# Patient Record
Sex: Male | Born: 1940 | Race: White | Hispanic: No | Marital: Married | Smoking: Former smoker
Health system: Southern US, Community
[De-identification: ages and names within clinical notes are randomized; demographics above are authoritative.]

## PROBLEM LIST (undated history)

## (undated) DIAGNOSIS — R413 Other amnesia: Secondary | ICD-10-CM

## (undated) DIAGNOSIS — E119 Type 2 diabetes mellitus without complications: Secondary | ICD-10-CM

## (undated) DIAGNOSIS — I639 Cerebral infarction, unspecified: Secondary | ICD-10-CM

## (undated) DIAGNOSIS — I10 Essential (primary) hypertension: Secondary | ICD-10-CM

## (undated) DIAGNOSIS — M1990 Unspecified osteoarthritis, unspecified site: Secondary | ICD-10-CM

## (undated) DIAGNOSIS — R7303 Prediabetes: Secondary | ICD-10-CM

## (undated) DIAGNOSIS — E785 Hyperlipidemia, unspecified: Secondary | ICD-10-CM

## (undated) HISTORY — DX: Hyperlipidemia, unspecified: E78.5

## (undated) HISTORY — DX: Other amnesia: R41.3

## (undated) HISTORY — DX: Cerebral infarction, unspecified: I63.9

## (undated) HISTORY — DX: Essential (primary) hypertension: I10

## (undated) HISTORY — PX: BACK SURGERY: SHX140

## (undated) HISTORY — PX: TONSILLECTOMY: SUR1361

## (undated) HISTORY — PX: EYE SURGERY: SHX253

## (undated) HISTORY — PX: NASAL SEPTUM SURGERY: SHX37

---

## 2002-11-09 ENCOUNTER — Encounter: Payer: Self-pay | Admitting: Dentistry

## 2002-11-09 ENCOUNTER — Ambulatory Visit (HOSPITAL_COMMUNITY): Admission: RE | Admit: 2002-11-09 | Discharge: 2002-11-09 | Payer: Self-pay | Admitting: Dentistry

## 2005-03-05 ENCOUNTER — Encounter: Admission: RE | Admit: 2005-03-05 | Discharge: 2005-03-05 | Payer: Self-pay | Admitting: Family Medicine

## 2006-09-29 ENCOUNTER — Encounter: Admission: RE | Admit: 2006-09-29 | Discharge: 2006-09-29 | Payer: Self-pay | Admitting: Family Medicine

## 2011-04-08 DIAGNOSIS — N529 Male erectile dysfunction, unspecified: Secondary | ICD-10-CM | POA: Diagnosis not present

## 2011-04-08 DIAGNOSIS — E782 Mixed hyperlipidemia: Secondary | ICD-10-CM | POA: Diagnosis not present

## 2011-04-08 DIAGNOSIS — M129 Arthropathy, unspecified: Secondary | ICD-10-CM | POA: Diagnosis not present

## 2011-04-08 DIAGNOSIS — E119 Type 2 diabetes mellitus without complications: Secondary | ICD-10-CM | POA: Diagnosis not present

## 2011-09-23 DIAGNOSIS — IMO0002 Reserved for concepts with insufficient information to code with codable children: Secondary | ICD-10-CM | POA: Diagnosis not present

## 2011-09-30 DIAGNOSIS — IMO0002 Reserved for concepts with insufficient information to code with codable children: Secondary | ICD-10-CM | POA: Diagnosis not present

## 2011-10-15 DIAGNOSIS — S43429A Sprain of unspecified rotator cuff capsule, initial encounter: Secondary | ICD-10-CM | POA: Diagnosis not present

## 2011-10-15 DIAGNOSIS — M19019 Primary osteoarthritis, unspecified shoulder: Secondary | ICD-10-CM | POA: Diagnosis not present

## 2011-10-17 DIAGNOSIS — N529 Male erectile dysfunction, unspecified: Secondary | ICD-10-CM | POA: Diagnosis not present

## 2011-10-17 DIAGNOSIS — E119 Type 2 diabetes mellitus without complications: Secondary | ICD-10-CM | POA: Diagnosis not present

## 2011-10-17 DIAGNOSIS — E782 Mixed hyperlipidemia: Secondary | ICD-10-CM | POA: Diagnosis not present

## 2011-10-17 DIAGNOSIS — N400 Enlarged prostate without lower urinary tract symptoms: Secondary | ICD-10-CM | POA: Diagnosis not present

## 2011-10-17 DIAGNOSIS — M12519 Traumatic arthropathy, unspecified shoulder: Secondary | ICD-10-CM | POA: Diagnosis not present

## 2011-11-16 DIAGNOSIS — Z23 Encounter for immunization: Secondary | ICD-10-CM | POA: Diagnosis not present

## 2011-12-01 DIAGNOSIS — M67919 Unspecified disorder of synovium and tendon, unspecified shoulder: Secondary | ICD-10-CM | POA: Diagnosis not present

## 2011-12-01 DIAGNOSIS — M24119 Other articular cartilage disorders, unspecified shoulder: Secondary | ICD-10-CM | POA: Diagnosis not present

## 2011-12-01 DIAGNOSIS — M19019 Primary osteoarthritis, unspecified shoulder: Secondary | ICD-10-CM | POA: Diagnosis not present

## 2011-12-01 DIAGNOSIS — M75110 Incomplete rotator cuff tear or rupture of unspecified shoulder, not specified as traumatic: Secondary | ICD-10-CM | POA: Diagnosis not present

## 2011-12-01 DIAGNOSIS — M719 Bursopathy, unspecified: Secondary | ICD-10-CM | POA: Diagnosis not present

## 2011-12-01 DIAGNOSIS — M7520 Bicipital tendinitis, unspecified shoulder: Secondary | ICD-10-CM | POA: Diagnosis not present

## 2011-12-01 DIAGNOSIS — M9420 Chondromalacia, unspecified site: Secondary | ICD-10-CM | POA: Diagnosis not present

## 2011-12-04 DIAGNOSIS — S43429A Sprain of unspecified rotator cuff capsule, initial encounter: Secondary | ICD-10-CM | POA: Diagnosis not present

## 2011-12-08 DIAGNOSIS — S43429A Sprain of unspecified rotator cuff capsule, initial encounter: Secondary | ICD-10-CM | POA: Diagnosis not present

## 2011-12-11 DIAGNOSIS — Z9889 Other specified postprocedural states: Secondary | ICD-10-CM | POA: Diagnosis not present

## 2011-12-11 DIAGNOSIS — S43429A Sprain of unspecified rotator cuff capsule, initial encounter: Secondary | ICD-10-CM | POA: Diagnosis not present

## 2011-12-16 DIAGNOSIS — S43429A Sprain of unspecified rotator cuff capsule, initial encounter: Secondary | ICD-10-CM | POA: Diagnosis not present

## 2011-12-19 DIAGNOSIS — S43429A Sprain of unspecified rotator cuff capsule, initial encounter: Secondary | ICD-10-CM | POA: Diagnosis not present

## 2011-12-23 DIAGNOSIS — S43429A Sprain of unspecified rotator cuff capsule, initial encounter: Secondary | ICD-10-CM | POA: Diagnosis not present

## 2011-12-26 DIAGNOSIS — S43429A Sprain of unspecified rotator cuff capsule, initial encounter: Secondary | ICD-10-CM | POA: Diagnosis not present

## 2011-12-29 DIAGNOSIS — S43429A Sprain of unspecified rotator cuff capsule, initial encounter: Secondary | ICD-10-CM | POA: Diagnosis not present

## 2011-12-31 DIAGNOSIS — S43429A Sprain of unspecified rotator cuff capsule, initial encounter: Secondary | ICD-10-CM | POA: Diagnosis not present

## 2012-01-05 DIAGNOSIS — S43429A Sprain of unspecified rotator cuff capsule, initial encounter: Secondary | ICD-10-CM | POA: Diagnosis not present

## 2012-01-12 DIAGNOSIS — S43429A Sprain of unspecified rotator cuff capsule, initial encounter: Secondary | ICD-10-CM | POA: Diagnosis not present

## 2012-01-19 DIAGNOSIS — S43429A Sprain of unspecified rotator cuff capsule, initial encounter: Secondary | ICD-10-CM | POA: Diagnosis not present

## 2012-01-26 DIAGNOSIS — S43429A Sprain of unspecified rotator cuff capsule, initial encounter: Secondary | ICD-10-CM | POA: Diagnosis not present

## 2012-02-02 DIAGNOSIS — S43429A Sprain of unspecified rotator cuff capsule, initial encounter: Secondary | ICD-10-CM | POA: Diagnosis not present

## 2012-02-11 DIAGNOSIS — S43429A Sprain of unspecified rotator cuff capsule, initial encounter: Secondary | ICD-10-CM | POA: Diagnosis not present

## 2012-02-18 DIAGNOSIS — S43429A Sprain of unspecified rotator cuff capsule, initial encounter: Secondary | ICD-10-CM | POA: Diagnosis not present

## 2012-02-25 DIAGNOSIS — S43429A Sprain of unspecified rotator cuff capsule, initial encounter: Secondary | ICD-10-CM | POA: Diagnosis not present

## 2012-03-01 DIAGNOSIS — S43429A Sprain of unspecified rotator cuff capsule, initial encounter: Secondary | ICD-10-CM | POA: Diagnosis not present

## 2012-03-16 DIAGNOSIS — S43429A Sprain of unspecified rotator cuff capsule, initial encounter: Secondary | ICD-10-CM | POA: Diagnosis not present

## 2012-03-18 DIAGNOSIS — S43429A Sprain of unspecified rotator cuff capsule, initial encounter: Secondary | ICD-10-CM | POA: Diagnosis not present

## 2012-04-15 DIAGNOSIS — S43429A Sprain of unspecified rotator cuff capsule, initial encounter: Secondary | ICD-10-CM | POA: Diagnosis not present

## 2012-04-19 DIAGNOSIS — E119 Type 2 diabetes mellitus without complications: Secondary | ICD-10-CM | POA: Diagnosis not present

## 2012-04-19 DIAGNOSIS — M25519 Pain in unspecified shoulder: Secondary | ICD-10-CM | POA: Diagnosis not present

## 2012-04-19 DIAGNOSIS — E782 Mixed hyperlipidemia: Secondary | ICD-10-CM | POA: Diagnosis not present

## 2012-04-19 DIAGNOSIS — N529 Male erectile dysfunction, unspecified: Secondary | ICD-10-CM | POA: Diagnosis not present

## 2012-09-14 DIAGNOSIS — Z23 Encounter for immunization: Secondary | ICD-10-CM | POA: Diagnosis not present

## 2012-09-14 DIAGNOSIS — E119 Type 2 diabetes mellitus without complications: Secondary | ICD-10-CM | POA: Diagnosis not present

## 2012-09-14 DIAGNOSIS — E782 Mixed hyperlipidemia: Secondary | ICD-10-CM | POA: Diagnosis not present

## 2012-09-14 DIAGNOSIS — M25569 Pain in unspecified knee: Secondary | ICD-10-CM | POA: Diagnosis not present

## 2012-09-14 DIAGNOSIS — Z1331 Encounter for screening for depression: Secondary | ICD-10-CM | POA: Diagnosis not present

## 2012-12-16 DIAGNOSIS — Z23 Encounter for immunization: Secondary | ICD-10-CM | POA: Diagnosis not present

## 2013-01-26 DIAGNOSIS — L259 Unspecified contact dermatitis, unspecified cause: Secondary | ICD-10-CM | POA: Diagnosis not present

## 2013-01-26 DIAGNOSIS — M6530 Trigger finger, unspecified finger: Secondary | ICD-10-CM | POA: Diagnosis not present

## 2013-02-28 DIAGNOSIS — M6530 Trigger finger, unspecified finger: Secondary | ICD-10-CM | POA: Diagnosis not present

## 2013-03-24 DIAGNOSIS — E119 Type 2 diabetes mellitus without complications: Secondary | ICD-10-CM | POA: Diagnosis not present

## 2013-03-24 DIAGNOSIS — E782 Mixed hyperlipidemia: Secondary | ICD-10-CM | POA: Diagnosis not present

## 2013-03-24 DIAGNOSIS — Z1331 Encounter for screening for depression: Secondary | ICD-10-CM | POA: Diagnosis not present

## 2013-03-24 DIAGNOSIS — M25559 Pain in unspecified hip: Secondary | ICD-10-CM | POA: Diagnosis not present

## 2013-03-29 DIAGNOSIS — B86 Scabies: Secondary | ICD-10-CM | POA: Diagnosis not present

## 2013-04-07 DIAGNOSIS — Z961 Presence of intraocular lens: Secondary | ICD-10-CM | POA: Diagnosis not present

## 2013-04-07 DIAGNOSIS — H35369 Drusen (degenerative) of macula, unspecified eye: Secondary | ICD-10-CM | POA: Diagnosis not present

## 2013-04-08 DIAGNOSIS — M169 Osteoarthritis of hip, unspecified: Secondary | ICD-10-CM | POA: Diagnosis not present

## 2013-04-08 DIAGNOSIS — M1610 Unilateral primary osteoarthritis, unspecified hip: Secondary | ICD-10-CM | POA: Diagnosis not present

## 2013-04-22 DIAGNOSIS — L259 Unspecified contact dermatitis, unspecified cause: Secondary | ICD-10-CM | POA: Diagnosis not present

## 2013-04-22 DIAGNOSIS — B86 Scabies: Secondary | ICD-10-CM | POA: Diagnosis not present

## 2013-05-11 DIAGNOSIS — M1610 Unilateral primary osteoarthritis, unspecified hip: Secondary | ICD-10-CM | POA: Diagnosis not present

## 2013-05-11 DIAGNOSIS — M169 Osteoarthritis of hip, unspecified: Secondary | ICD-10-CM | POA: Diagnosis not present

## 2013-05-24 DIAGNOSIS — L259 Unspecified contact dermatitis, unspecified cause: Secondary | ICD-10-CM | POA: Diagnosis not present

## 2013-06-23 DIAGNOSIS — R197 Diarrhea, unspecified: Secondary | ICD-10-CM | POA: Diagnosis not present

## 2013-06-23 DIAGNOSIS — R1110 Vomiting, unspecified: Secondary | ICD-10-CM | POA: Diagnosis not present

## 2013-10-04 ENCOUNTER — Other Ambulatory Visit: Payer: Self-pay | Admitting: Family Medicine

## 2013-10-04 DIAGNOSIS — Z23 Encounter for immunization: Secondary | ICD-10-CM | POA: Diagnosis not present

## 2013-10-04 DIAGNOSIS — E782 Mixed hyperlipidemia: Secondary | ICD-10-CM | POA: Diagnosis not present

## 2013-10-04 DIAGNOSIS — Z1211 Encounter for screening for malignant neoplasm of colon: Secondary | ICD-10-CM | POA: Diagnosis not present

## 2013-10-04 DIAGNOSIS — M25559 Pain in unspecified hip: Secondary | ICD-10-CM | POA: Diagnosis not present

## 2013-10-04 DIAGNOSIS — Z87891 Personal history of nicotine dependence: Secondary | ICD-10-CM

## 2013-10-04 DIAGNOSIS — E119 Type 2 diabetes mellitus without complications: Secondary | ICD-10-CM | POA: Diagnosis not present

## 2013-10-04 DIAGNOSIS — Z136 Encounter for screening for cardiovascular disorders: Secondary | ICD-10-CM | POA: Diagnosis not present

## 2013-10-04 DIAGNOSIS — Z0000 Encounter for general adult medical examination without abnormal findings: Secondary | ICD-10-CM | POA: Diagnosis not present

## 2013-10-04 DIAGNOSIS — Z125 Encounter for screening for malignant neoplasm of prostate: Secondary | ICD-10-CM | POA: Diagnosis not present

## 2013-10-04 DIAGNOSIS — L2089 Other atopic dermatitis: Secondary | ICD-10-CM | POA: Diagnosis not present

## 2013-10-10 ENCOUNTER — Ambulatory Visit
Admission: RE | Admit: 2013-10-10 | Discharge: 2013-10-10 | Disposition: A | Discharge status: Home / Self Care | Payer: Medicare Other | Source: Ambulatory Visit | Attending: Family Medicine | Admitting: Family Medicine

## 2013-10-10 DIAGNOSIS — Z136 Encounter for screening for cardiovascular disorders: Secondary | ICD-10-CM | POA: Diagnosis not present

## 2013-10-10 DIAGNOSIS — Z87891 Personal history of nicotine dependence: Secondary | ICD-10-CM

## 2013-11-16 DIAGNOSIS — Z23 Encounter for immunization: Secondary | ICD-10-CM | POA: Diagnosis not present

## 2014-01-31 DIAGNOSIS — L300 Nummular dermatitis: Secondary | ICD-10-CM | POA: Diagnosis not present

## 2015-04-02 DIAGNOSIS — Z961 Presence of intraocular lens: Secondary | ICD-10-CM | POA: Diagnosis not present

## 2015-04-02 DIAGNOSIS — E119 Type 2 diabetes mellitus without complications: Secondary | ICD-10-CM | POA: Diagnosis not present

## 2015-04-02 DIAGNOSIS — H524 Presbyopia: Secondary | ICD-10-CM | POA: Diagnosis not present

## 2015-06-29 DIAGNOSIS — L309 Dermatitis, unspecified: Secondary | ICD-10-CM | POA: Diagnosis not present

## 2015-06-29 DIAGNOSIS — E782 Mixed hyperlipidemia: Secondary | ICD-10-CM | POA: Diagnosis not present

## 2015-06-29 DIAGNOSIS — M859 Disorder of bone density and structure, unspecified: Secondary | ICD-10-CM | POA: Diagnosis not present

## 2015-06-29 DIAGNOSIS — Z7984 Long term (current) use of oral hypoglycemic drugs: Secondary | ICD-10-CM | POA: Diagnosis not present

## 2015-06-29 DIAGNOSIS — Z1389 Encounter for screening for other disorder: Secondary | ICD-10-CM | POA: Diagnosis not present

## 2015-06-29 DIAGNOSIS — M25531 Pain in right wrist: Secondary | ICD-10-CM | POA: Diagnosis not present

## 2015-06-29 DIAGNOSIS — E119 Type 2 diabetes mellitus without complications: Secondary | ICD-10-CM | POA: Diagnosis not present

## 2015-06-29 DIAGNOSIS — M8580 Other specified disorders of bone density and structure, unspecified site: Secondary | ICD-10-CM | POA: Diagnosis not present

## 2015-07-24 DIAGNOSIS — M778 Other enthesopathies, not elsewhere classified: Secondary | ICD-10-CM | POA: Diagnosis not present

## 2015-07-24 DIAGNOSIS — R2231 Localized swelling, mass and lump, right upper limb: Secondary | ICD-10-CM | POA: Diagnosis not present

## 2015-09-04 DIAGNOSIS — M65331 Trigger finger, right middle finger: Secondary | ICD-10-CM | POA: Diagnosis not present

## 2015-09-04 DIAGNOSIS — M67441 Ganglion, right hand: Secondary | ICD-10-CM | POA: Diagnosis not present

## 2015-09-04 DIAGNOSIS — R2231 Localized swelling, mass and lump, right upper limb: Secondary | ICD-10-CM | POA: Diagnosis not present

## 2016-01-01 DIAGNOSIS — J309 Allergic rhinitis, unspecified: Secondary | ICD-10-CM | POA: Diagnosis not present

## 2016-01-01 DIAGNOSIS — Z1389 Encounter for screening for other disorder: Secondary | ICD-10-CM | POA: Diagnosis not present

## 2016-01-01 DIAGNOSIS — Z0000 Encounter for general adult medical examination without abnormal findings: Secondary | ICD-10-CM | POA: Diagnosis not present

## 2016-01-01 DIAGNOSIS — E782 Mixed hyperlipidemia: Secondary | ICD-10-CM | POA: Diagnosis not present

## 2016-01-01 DIAGNOSIS — Z23 Encounter for immunization: Secondary | ICD-10-CM | POA: Diagnosis not present

## 2016-01-01 DIAGNOSIS — E119 Type 2 diabetes mellitus without complications: Secondary | ICD-10-CM | POA: Diagnosis not present

## 2016-01-01 DIAGNOSIS — E559 Vitamin D deficiency, unspecified: Secondary | ICD-10-CM | POA: Diagnosis not present

## 2016-01-01 DIAGNOSIS — L309 Dermatitis, unspecified: Secondary | ICD-10-CM | POA: Diagnosis not present

## 2016-01-01 DIAGNOSIS — M8580 Other specified disorders of bone density and structure, unspecified site: Secondary | ICD-10-CM | POA: Diagnosis not present

## 2016-01-01 DIAGNOSIS — Z125 Encounter for screening for malignant neoplasm of prostate: Secondary | ICD-10-CM | POA: Diagnosis not present

## 2016-01-01 DIAGNOSIS — Z1211 Encounter for screening for malignant neoplasm of colon: Secondary | ICD-10-CM | POA: Diagnosis not present

## 2016-01-10 DIAGNOSIS — Z1211 Encounter for screening for malignant neoplasm of colon: Secondary | ICD-10-CM | POA: Diagnosis not present

## 2016-04-09 DIAGNOSIS — E119 Type 2 diabetes mellitus without complications: Secondary | ICD-10-CM | POA: Diagnosis not present

## 2016-04-09 DIAGNOSIS — H5211 Myopia, right eye: Secondary | ICD-10-CM | POA: Diagnosis not present

## 2016-04-09 DIAGNOSIS — H524 Presbyopia: Secondary | ICD-10-CM | POA: Diagnosis not present

## 2016-06-09 ENCOUNTER — Encounter: Payer: Self-pay | Admitting: Podiatry

## 2016-06-09 ENCOUNTER — Ambulatory Visit (INDEPENDENT_AMBULATORY_CARE_PROVIDER_SITE_OTHER): Payer: PPO | Admitting: Podiatry

## 2016-06-09 ENCOUNTER — Ambulatory Visit (INDEPENDENT_AMBULATORY_CARE_PROVIDER_SITE_OTHER): Payer: PPO

## 2016-06-09 DIAGNOSIS — M779 Enthesopathy, unspecified: Secondary | ICD-10-CM | POA: Diagnosis not present

## 2016-06-09 DIAGNOSIS — M722 Plantar fascial fibromatosis: Secondary | ICD-10-CM | POA: Insufficient documentation

## 2016-06-09 MED ORDER — MELOXICAM 15 MG PO TABS: 15.0000 mg | ORAL_TABLET | Freq: Every day | ORAL | 2 refills | Status: DC

## 2016-06-09 NOTE — Progress Notes (Signed)
Subjective:     Patient ID: Timothy Brewer, male   DOB: [birth year], 76 y.o.   MRN: [id number]  HPI 76 year old male presents to the office today for concerns left foot pain on the heel which is been ongoing for about 6 weeks. He states that he had a history of plantar fasciitis but this is one the arch of the foot. He denies any numbness or tingling. Denies any recent injury or trauma. The pain does not wake him up at night. He did have orthotics made when he had the arch issue is been wearing but this has not been helping the foot. He had no other treatment. He has no other complaints at this time.  Review of Systems  All other systems reviewed and are negative.      Objective:   Physical Exam General: AAO x3, NAD  Dermatological: Skin is warm, dry and supple bilateral. Nails x 10 are well manicured; remaining integument appears unremarkable at this time. There are no open sores, no preulcerative lesions, no rash or signs of infection present.  Vascular: Dorsalis Pedis artery and Posterior Tibial artery pedal pulses are 2/4 bilateral with immedate capillary fill time. There is no pain with calf compression, swelling, warmth, erythema.   Neruologic: Grossly intact via light touch bilateral. Vibratory intact via tuning fork bilateral. Protective threshold with Semmes Wienstein monofilament intact to all pedal sites bilateral.   Musculoskeletal: Tenderness to palpation along the plantar medial tubercle of the calcaneus at the insertion of plantar fascia on the left foot. There is no pain along the course of the plantar fascia within the arch of the foot. Plantar fascia appears to be intact. There is no pain with lateral compression of the calcaneus or pain with vibratory sensation. There is no pain along the course or insertion of the achilles tendon. No other areas of tenderness to bilateral lower extremities.  Muscular strength 5/5 in all groups tested bilateral.  Gait: Unassisted,  Nonantalgic.      Assessment:     Left heel pain, likely plantar fasciitis    Plan:     -Treatment options discussed including all alternatives, risks, and complications -Etiology of symptoms were discussed -X-rays were obtained and reviewed with the patient.  -Patient elects to proceed with steroid injection into the left heel. Under sterile skin preparation, a total of 2.5cc of kenalog 10, 0.5% Marcaine plain, and 2% lidocaine plain were infiltrated into the symptomatic area without complication. A band-aid was applied. Patient tolerated the injection well without complication. Post-injection care with discussed with the patient. Discussed with the patient to ice the area over the next couple of days to help prevent a steroid flare.  Plantar fascial brace was dispensed -Prescribed mobic. Discussed side effects of the medication and directed to stop if any are to occur and call the office.  -Night splint dispensed  -Stretching, icing exercises daily. Dispensed aspects -Shoegear modifications. Continue orthotics. He was only wearing it on the left and recommended to wear the one on the right as well -RTC in 3 weeks or sooner if needed  Celesta Gentile, DPM

## 2016-06-30 ENCOUNTER — Ambulatory Visit (INDEPENDENT_AMBULATORY_CARE_PROVIDER_SITE_OTHER): Payer: PPO | Admitting: Podiatry

## 2016-06-30 ENCOUNTER — Encounter: Payer: Self-pay | Admitting: Podiatry

## 2016-06-30 DIAGNOSIS — M722 Plantar fascial fibromatosis: Secondary | ICD-10-CM

## 2016-06-30 NOTE — Progress Notes (Signed)
Subjective: Timothy Brewer presents to the office today for follow-up evaluation of left heel pain. They state that they are doing about "half better". He did purchase new shoes which has been helping. He was wearing the plantar fascial brace the first couple weeks but stopped wearing it. He has not been stretching or icing. No other complaints at this time. No acute changes since last appointment. They deny any systemic complaints such as fevers, chills, nausea, vomiting.  Objective: General: AAO x3, NAD  Dermatological: Skin is warm, dry and supple bilateral. Nails x 10 are well manicured; remaining integument appears unremarkable at this time. There are no open sores, no preulcerative lesions, no rash or signs of infection present.  Vascular: Dorsalis Pedis artery and Posterior Tibial artery pedal pulses are 2/4 bilateral with immedate capillary fill time. Pedal hair growth present. There is no pain with calf compression, swelling, warmth, erythema.   Neruologic: Grossly intact via light touch bilateral.   Musculoskeletal: There is improved yet continued tenderness palpation along the plantar medial tubercle of the calcaneus at the insertion of the plantar fascia on the  left foot. There is no pain along the course of the plantar fascia within the arch of the foot. Plantar fascia appears to be intact bilaterally. There is no pain with lateral compression of the calcaneus and there is no pain with vibratory sensation. There is no pain along the course or insertion of the Achilles tendon. There are no other areas of tenderness to bilateral lower extremities. No gross boney pedal deformities bilateral. No pain, crepitus, or limitation noted with foot and ankle range of motion bilateral. Muscular strength 5/5 in all groups tested bilateral.  Gait: Unassisted, Nonantalgic.   Assessment: Presents for follow-up evaluation for heel pain, likely plantar fasciitis with about 50%  improvement  Plan: -Treatment options discussed including all alternatives, risks, and complications -Patient elects to proceed with steroid injection into the left heel. Under sterile skin preparation, a total of 2.5cc of kenalog 10, 0.5% Marcaine plain, and 2% lidocaine plain were infiltrated into the symptomatic area without complication. A band-aid was applied. Patient tolerated the injection well without complication. Post-injection care with discussed with the patient. Discussed with the patient to ice the area over the next couple of days to help prevent a steroid flare.  -Plantar fascial taping applied. -Continue plantar fascial brace. As well as night splint. -Ice and stretching exercises on a daily basis. -Continue supportive shoe gear. -Follow-up in 4 weeks or sooner if any problems arise. In the meantime, encouraged to call the office with any questions, concerns, change in symptoms.   Celesta Gentile, DPM

## 2016-07-01 DIAGNOSIS — E782 Mixed hyperlipidemia: Secondary | ICD-10-CM | POA: Diagnosis not present

## 2016-07-01 DIAGNOSIS — M722 Plantar fascial fibromatosis: Secondary | ICD-10-CM | POA: Diagnosis not present

## 2016-07-01 DIAGNOSIS — E119 Type 2 diabetes mellitus without complications: Secondary | ICD-10-CM | POA: Diagnosis not present

## 2016-07-01 DIAGNOSIS — E559 Vitamin D deficiency, unspecified: Secondary | ICD-10-CM | POA: Diagnosis not present

## 2016-07-01 DIAGNOSIS — J309 Allergic rhinitis, unspecified: Secondary | ICD-10-CM | POA: Diagnosis not present

## 2016-07-01 DIAGNOSIS — M8580 Other specified disorders of bone density and structure, unspecified site: Secondary | ICD-10-CM | POA: Diagnosis not present

## 2016-07-01 DIAGNOSIS — L309 Dermatitis, unspecified: Secondary | ICD-10-CM | POA: Diagnosis not present

## 2016-07-28 ENCOUNTER — Ambulatory Visit (INDEPENDENT_AMBULATORY_CARE_PROVIDER_SITE_OTHER): Payer: PPO | Admitting: Podiatry

## 2016-07-28 ENCOUNTER — Encounter: Payer: Self-pay | Admitting: Podiatry

## 2016-07-28 DIAGNOSIS — M722 Plantar fascial fibromatosis: Secondary | ICD-10-CM | POA: Diagnosis not present

## 2016-07-28 MED ORDER — TRIAMCINOLONE ACETONIDE 10 MG/ML IJ SUSP
10.0000 mg | Freq: Once | INTRAMUSCULAR | Status: AC
  Administered 2016-07-28: 10 mg

## 2016-07-28 NOTE — Patient Instructions (Signed)

## 2016-07-31 NOTE — Progress Notes (Signed)
Subjective: Timothy Brewer presents to the office today for follow-up evaluation of left heel pain. He states that he is doing better but still having some pain, although not like he was. He states he has not been stretching. His knee shoes are feeling better. No other complaints at this time. No acute changes since last appointment. They deny any systemic complaints such as fevers, chills, nausea, vomiting.  Objective: General: AAO x3, NAD  Dermatological:  There are no open sores, no preulcerative lesions, no rash or signs of infection present.  Vascular: Dorsalis Pedis artery and Posterior Tibial artery pedal pulses are 2/4 bilateral with immedate capillary fill time. Pedal hair growth present. There is no pain with calf compression, swelling, warmth, erythema.   Neruologic: Grossly intact via light touch bilateral. Negative tinel sign.   Musculoskeletal: There is mild continued tenderness tp palpation along the plantar medial tubercle of the calcaneus at the insertion of the plantar fascia on the  left foot. There is no pain along the course of the plantar fascia within the arch of the foot. Plantar fascia appears to be intact bilaterally. There is no pain with lateral compression of the calcaneus and there is no pain with vibratory sensation. There is no pain along the course or insertion of the Achilles tendon. There are no other areas of tenderness to bilateral lower extremities. No gross boney pedal deformities bilateral. No pain, crepitus, or limitation noted with foot and ankle range of motion bilateral. Muscular strength 5/5 in all groups tested bilateral.  Gait: Unassisted, Nonantalgic.   Assessment: Presents for follow-up evaluation for heel pain, likely plantar fasciitis with about 50% improvement  Plan: -Treatment options discussed including all alternatives, risks, and complications -Patient elects to proceed with steroid injection into the left heel. Under sterile skin  preparation, a total of 2.5cc of kenalog 10, 0.5% Marcaine plain, and 2% lidocaine plain were infiltrated into the symptomatic area without complication. A band-aid was applied. Patient tolerated the injection well without complication. Post-injection care with discussed with the patient. Discussed with the patient to ice the area over the next couple of days to help prevent a steroid flare.  -Continue plantar fascial brace; night splint. -Ice and stretching exercises on a daily basis. -Continue supportive shoe gear. -Follow-up in 4 weeks if symptoms continue or sooner if any problems arise. In the meantime, encouraged to call the office with any questions, concerns, change in symptoms.   Celesta Gentile, DPM

## 2016-08-29 ENCOUNTER — Ambulatory Visit: Payer: PPO | Admitting: Podiatry

## 2016-12-04 DIAGNOSIS — M1612 Unilateral primary osteoarthritis, left hip: Secondary | ICD-10-CM | POA: Diagnosis not present

## 2016-12-04 DIAGNOSIS — M25552 Pain in left hip: Secondary | ICD-10-CM | POA: Diagnosis not present

## 2016-12-17 DIAGNOSIS — M1612 Unilateral primary osteoarthritis, left hip: Secondary | ICD-10-CM | POA: Diagnosis not present

## 2017-01-14 DIAGNOSIS — M1612 Unilateral primary osteoarthritis, left hip: Secondary | ICD-10-CM | POA: Diagnosis not present

## 2017-01-14 DIAGNOSIS — M25552 Pain in left hip: Secondary | ICD-10-CM | POA: Diagnosis not present

## 2017-02-04 NOTE — H&P (Signed)
TOTAL HIP ADMISSION H&P  Patient is admitted for left total hip arthroplasty, anterior approach.  Subjective:  Chief Complaint:     Left hip primary OA / pain  HPI: Timothy Brewer, 76 y.o. male, has a history of pain and functional disability in the left hip(s) due to arthritis and patient has failed non-surgical conservative treatments for greater than 12 weeks to include NSAID's and/or analgesics, corticosteriod injections and activity modification.  Onset of symptoms was gradual starting 3+ years ago with gradually worsening course since that time.The patient noted no past surgery on the left hip(s).  Patient currently rates pain in the left hip at 8 out of 10 with activity. Patient has night pain, worsening of pain with activity and weight bearing, trendelenberg gait, pain that interfers with activities of daily living and pain with passive range of motion. Patient has evidence of periarticular osteophytes and joint space narrowing by imaging studies. This condition presents safety issues increasing the risk of falls.  There is no current active infection.  Risks, benefits and expectations were discussed with the patient.  Risks including but not limited to the risk of anesthesia, blood clots, nerve damage, blood vessel damage, failure of the prosthesis, infection and up to and including death.  Patient understand the risks, benefits and expectations and wishes to proceed with surgery.    PCP: Timothy Contras, Timothy Brewer  D/C Plans:       Home   Post-op Meds:       No Rx given  Tranexamic Acid:      To be given - IV   Decadron:      Is to be given  FYI:     ASA  Norco  DME:      Patient didn't receive Rx for RW  PT:       No PT    Patient Active Problem List   Diagnosis Date Noted  . Plantar fasciitis 06/09/2016   Past Medical History:  Diagnosis Date  . Arthritis   . Pre-diabetes    on metformin    Past Surgical History:  Procedure Laterality Date  . BACK SURGERY     c3,c4,c5,c6  ,l4,l5  . EYE SURGERY     bil cataracts  . NASAL SEPTUM SURGERY    . TONSILLECTOMY      No current facility-administered medications for this encounter.    Current Outpatient Medications  Medication Sig Dispense Refill Last Dose  . aspirin EC 81 MG tablet Take 81 mg by mouth daily.     Marland Kitchen atorvastatin (LIPITOR) 20 MG tablet Take 20 mg by mouth 2 (two) times a week.      . Calcium Polycarbophil (FIBER-CAPS PO) Take 1 capsule by mouth 2 (two) times daily.      . cetirizine (ZYRTEC) 10 MG tablet Take 10 mg by mouth at bedtime.     . clobetasol cream (TEMOVATE) 6.59 % Apply 1 application topically daily.     . fexofenadine (ALLEGRA) 180 MG tablet Take 180 mg by mouth daily.     . meloxicam (MOBIC) 15 MG tablet Take 1 tablet (15 mg total) by mouth daily. 30 tablet 2   . metFORMIN (GLUCOPHAGE-XR) 500 MG 24 hr tablet Take 500 mg by mouth 2 (two) times daily.      . Multiple Vitamin (MULTIVITAMIN) tablet Take 1 tablet by mouth daily.     . Multiple Vitamins-Minerals (PRESERVISION AREDS 2 PO) Take 1 capsule by mouth 2 (two) times daily.     Marland Kitchen  OVER THE COUNTER MEDICATION Take 2 tablets by mouth daily. Active PK Supplement      Allergies  Allergen Reactions  . Iodine Hives and Other (See Comments)    Internal only    Social History   Tobacco Use  . Smoking status: Current Every Day Smoker    Types: Cigarettes  . Smokeless tobacco: Never Used  . Tobacco comment: 8-10 a day  Substance Use Topics  . Alcohol use: Yes    Alcohol/week: 0.6 oz    Types: 1 Glasses of wine per week    Comment: socially       Review of Systems  Constitutional: Negative.   HENT: Positive for tinnitus.   Eyes: Negative.   Respiratory: Negative.   Cardiovascular: Negative.   Gastrointestinal: Negative.   Genitourinary: Negative.   Musculoskeletal: Positive for joint pain.  Skin: Negative.   Neurological: Negative.   Endo/Heme/Allergies: Negative.   Psychiatric/Behavioral: Negative.      Objective:  Physical Exam  Constitutional: He is oriented to person, place, and time. He appears well-developed.  HENT:  Head: Normocephalic.  Mouth/Throat: He has dentures.  Eyes: Pupils are equal, round, and reactive to light.  Neck: Neck supple. No JVD present. No tracheal deviation present. No thyromegaly present.  Cardiovascular: Normal rate, regular rhythm and intact distal pulses.  Respiratory: Effort normal and breath sounds normal. No respiratory distress. He has no wheezes.  GI: Soft. There is no tenderness. There is no guarding.  Musculoskeletal:       Left hip: He exhibits decreased range of motion, decreased strength, tenderness and bony tenderness. He exhibits no swelling, no deformity and no laceration.  Lymphadenopathy:    He has no cervical adenopathy.  Neurological: He is alert and oriented to person, place, and time.  Skin: Skin is warm and dry.  Psychiatric: He has a normal mood and affect.      Imaging Review Plain radiographs demonstrate severe degenerative joint disease of the left hip(s). The bone quality appears to be good for age and reported activity level.  Assessment/Plan:  End stage arthritis, left hip(s)  The patient history, physical examination, clinical judgement of the provider and imaging studies are consistent with end stage degenerative joint disease of the left hip(s) and total hip arthroplasty is deemed medically necessary. The treatment options including medical management, injection therapy, arthroscopy and arthroplasty were discussed at length. The risks and benefits of total hip arthroplasty were presented and reviewed. The risks due to aseptic loosening, infection, stiffness, dislocation/subluxation,  thromboembolic complications and other imponderables were discussed.  The patient acknowledged the explanation, agreed to proceed with the plan and consent was signed. Patient is being admitted for inpatient treatment for surgery, pain  control, PT, OT, prophylactic antibiotics, VTE prophylaxis, progressive ambulation and ADL's and discharge planning.The patient is planning to be discharged home.     West Pugh Tobin Witucki   PA-C  02/15/2017, 5:52 PM

## 2017-02-06 DIAGNOSIS — M1612 Unilateral primary osteoarthritis, left hip: Secondary | ICD-10-CM | POA: Diagnosis not present

## 2017-02-10 ENCOUNTER — Encounter (HOSPITAL_COMMUNITY): Payer: Self-pay

## 2017-02-10 DIAGNOSIS — Z7984 Long term (current) use of oral hypoglycemic drugs: Secondary | ICD-10-CM | POA: Diagnosis not present

## 2017-02-10 DIAGNOSIS — E782 Mixed hyperlipidemia: Secondary | ICD-10-CM | POA: Diagnosis not present

## 2017-02-10 DIAGNOSIS — M25552 Pain in left hip: Secondary | ICD-10-CM | POA: Diagnosis not present

## 2017-02-10 DIAGNOSIS — E119 Type 2 diabetes mellitus without complications: Secondary | ICD-10-CM | POA: Diagnosis not present

## 2017-02-10 DIAGNOSIS — Z1389 Encounter for screening for other disorder: Secondary | ICD-10-CM | POA: Diagnosis not present

## 2017-02-10 NOTE — Patient Instructions (Signed)
Timothy Brewer  02/10/2017   Your procedure is scheduled on:02-17-17   Report to [facility] Main  Entrance   Report to admitting at     0735 AM   Call this number if you have problems the morning of surgery   [phone]   Remember: ONLY 1 PERSON MAY GO WITH YOU TO SHORT STAY TO GET  READY MORNING OF [place].  Do not eat food or drink liquids :After Midnight.     Take these medicines the morning of surgery with A SIP OF WATER:  [place]  DO NOT TAKE ANY DIABETIC MEDICATIONS DAY OF YOUR SURGERY                               You may not have any metal on your body including hair pins and              piercings  Do not wear jewelry,  lotions, powders or perfumes, deodorant                        Men may shave face and neck.   Do not bring valuables to the hospital. [place].  Contacts, dentures or bridgework may not be worn into surgery.  Leave suitcase in the car. After surgery it may be brought to your room.                  Please read over the following fact sheets you were given: _____________________________________________________________________          [facility] - Preparing for Surgery Before surgery, you can play an important role.  Because skin is not sterile, your skin needs to be as free of germs as possible.  You can reduce the number of germs on your skin by washing with CHG (chlorahexidine gluconate) soap before surgery.  CHG is an antiseptic cleaner which kills germs and bonds with the skin to continue killing germs even after washing. Please DO NOT use if you have an allergy to CHG or antibacterial soaps.  If your skin becomes reddened/irritated stop using the CHG and inform your nurse when you arrive at Short Stay. Do not shave (including legs and underarms) for at least 48 hours prior to the first CHG shower.  You may shave your face/neck. Please follow these instructions  carefully:  1.  Shower with CHG Soap the night before surgery and the  morning of Surgery.  2.  If you choose to wash your hair, wash your hair first as usual with your  normal  shampoo.  3.  After you shampoo, rinse your hair and body thoroughly to remove the  shampoo.                           4.  Use CHG as you would any other liquid soap.  You can apply chg directly  to the skin and wash                       Gently with a scrungie or clean washcloth.  5.  Apply the CHG Soap to your body ONLY FROM THE NECK DOWN.  Do not use on face/ open                           Wound or open sores. Avoid contact with eyes, ears mouth and genitals (private parts).                       Wash face,  Genitals (private parts) with your normal soap.             6.  Wash thoroughly, paying special attention to the area where your surgery  will be performed.  7.  Thoroughly rinse your body with warm water from the neck down.  8.  DO NOT shower/wash with your normal soap after using and rinsing off  the CHG Soap.                9.  Pat yourself dry with a clean towel.            10.  Wear clean pajamas.            11.  Place clean sheets on your bed the night of your first shower and do not  sleep with pets. Day of Surgery : Do not apply any lotions/deodorants the morning of surgery.  Please wear clean clothes to the hospital/surgery center.  FAILURE TO FOLLOW THESE INSTRUCTIONS MAY RESULT IN THE CANCELLATION OF YOUR SURGERY PATIENT SIGNATURE_________________________________  NURSE SIGNATURE__________________________________  ________________________________________________________________________  WHAT IS A BLOOD TRANSFUSION? Blood Transfusion Information  A transfusion is the replacement of blood or some of its parts. Blood is made up of multiple cells which provide different functions.  Red blood cells carry oxygen and are used for blood loss replacement.  White blood cells fight against  infection.  Platelets control bleeding.  Plasma helps clot blood.  Other blood products are available for specialized needs, such as hemophilia or other clotting disorders. BEFORE THE TRANSFUSION  Who gives blood for transfusions?   Healthy volunteers who are fully evaluated to make sure their blood is safe. This is blood bank blood. Transfusion therapy is the safest it has ever been in the practice of medicine. Before blood is taken from a donor, a complete history is taken to make sure that person has no history of diseases nor engages in risky social behavior (examples are intravenous drug use or sexual activity with multiple partners). The donor's travel history is screened to minimize risk of transmitting infections, such as malaria. The donated blood is tested for signs of infectious diseases, such as HIV and hepatitis. The blood is then tested to be sure it is compatible with you in order to minimize the chance of a transfusion reaction. If you or a relative donates blood, this is often done in anticipation of surgery and is not appropriate for emergency situations. It takes many days to process the donated blood. RISKS AND COMPLICATIONS Although transfusion therapy is very safe and saves many lives, the main dangers of transfusion include:   Getting an infectious disease.  Developing a transfusion reaction. This is an allergic reaction to something in the blood you were given. Every precaution is taken to prevent this. The decision to have a blood transfusion has been considered carefully by your caregiver before blood is given. Blood is not given unless the benefits outweigh the risks. AFTER THE TRANSFUSION  Right after receiving a blood transfusion, you will usually feel much better and more energetic. This is especially  true if your red blood cells have gotten low (anemic). The transfusion raises the level of the red blood cells which carry oxygen, and this usually causes an energy  increase.  The nurse administering the transfusion will monitor you carefully for complications. HOME CARE INSTRUCTIONS  No special instructions are needed after a transfusion. You may find your energy is better. Speak with your caregiver about any limitations on activity for underlying diseases you may have. SEEK MEDICAL CARE IF:   Your condition is not improving after your transfusion.  You develop redness or irritation at the intravenous (IV) site. SEEK IMMEDIATE MEDICAL CARE IF:  Any of the following symptoms occur over the next 12 hours:  Shaking chills.  You have a temperature by mouth above 102 F (38.9 C), not controlled by medicine.  Chest, back, or muscle pain.  People around you feel you are not acting correctly or are confused.  Shortness of breath or difficulty breathing.  Dizziness and fainting.  You get a rash or develop hives.  You have a decrease in urine output.  Your urine turns a dark color or changes to pink, red, or brown. Any of the following symptoms occur over the next 10 days:  You have a temperature by mouth above 102 F (38.9 C), not controlled by medicine.  Shortness of breath.  Weakness after normal activity.  The white part of the eye turns yellow (jaundice).  You have a decrease in the amount of urine or are urinating less often.  Your urine turns a dark color or changes to pink, red, or brown. Document Released: 02/22/2000 Document Revised: 05/19/2011 Document Reviewed: 10/11/2007 ExitCare Patient Information 2014 [place].  _______________________________________________________________________  Incentive Spirometer  An incentive spirometer is a tool that can help keep your lungs clear and active. This tool measures how well you are filling your lungs with each breath. Taking long deep breaths may help reverse or decrease the chance of developing breathing (pulmonary) problems (especially infection) following:  A long  period of time when you are unable to move or be active. BEFORE THE PROCEDURE   If the spirometer includes an indicator to show your best effort, your nurse or respiratory therapist will set it to a desired goal.  If possible, sit up straight or lean slightly forward. Try not to slouch.  Hold the incentive spirometer in an upright position. INSTRUCTIONS FOR USE  1. Sit on the edge of your bed if possible, or sit up as far as you can in bed or on a chair. 2. Hold the incentive spirometer in an upright position. 3. Breathe out normally. 4. Place the mouthpiece in your mouth and seal your lips tightly around it. 5. Breathe in slowly and as deeply as possible, raising the piston or the ball toward the top of the column. 6. Hold your breath for 3-5 seconds or for as long as possible. Allow the piston or ball to fall to the bottom of the column. 7. Remove the mouthpiece from your mouth and breathe out normally. 8. Rest for a few seconds and repeat Steps 1 through 7 at least 10 times every 1-2 hours when you are awake. Take your time and take a few normal breaths between deep breaths. 9. The spirometer may include an indicator to show your best effort. Use the indicator as a goal to work toward during each repetition. 10. After each set of 10 deep breaths, practice coughing to be sure your lungs are clear. If you have  an incision (the cut made at the time of surgery), support your incision when coughing by placing a pillow or rolled up towels firmly against it. Once you are able to get out of bed, walk around indoors and cough well. You may stop using the incentive spirometer when instructed by your caregiver.  RISKS AND COMPLICATIONS  Take your time so you do not get dizzy or light-headed.  If you are in pain, you may need to take or ask for pain medication before doing incentive spirometry. It is harder to take a deep breath if you are having pain. AFTER USE  Rest and breathe slowly and  easily.  It can be helpful to keep track of a log of your progress. Your caregiver can provide you with a simple table to help with this. If you are using the spirometer at home, follow these instructions: [place] IF:   You are having difficultly using the spirometer.  You have trouble using the spirometer as often as instructed.  Your pain medication is not giving enough relief while using the spirometer.  You develop fever of 100.5 F (38.1 C) or higher. SEEK IMMEDIATE MEDICAL CARE IF:   You cough up bloody sputum that had not been present before.  You develop fever of 102 F (38.9 C) or greater.  You develop worsening pain at or near the incision site. MAKE SURE YOU:   Understand these instructions.  Will watch your condition.  Will get help right away if you are not doing well or get worse. Document Released: 07/07/2006 Document Revised: 05/19/2011 Document Reviewed: 09/07/2006 [facility] Patient Information 2014 [place], [place].   ________________________________________________________________________

## 2017-02-12 ENCOUNTER — Encounter (HOSPITAL_COMMUNITY)
Admission: RE | Admit: 2017-02-12 | Discharge: 2017-02-12 | Disposition: A | Discharge status: Home / Self Care | Payer: PPO | Source: Ambulatory Visit | Attending: Orthopedic Surgery | Admitting: Orthopedic Surgery

## 2017-02-12 ENCOUNTER — Other Ambulatory Visit: Payer: Self-pay

## 2017-02-12 ENCOUNTER — Encounter (HOSPITAL_COMMUNITY): Payer: Self-pay

## 2017-02-12 DIAGNOSIS — M1612 Unilateral primary osteoarthritis, left hip: Secondary | ICD-10-CM | POA: Diagnosis not present

## 2017-02-12 DIAGNOSIS — Z01812 Encounter for preprocedural laboratory examination: Secondary | ICD-10-CM | POA: Diagnosis not present

## 2017-02-12 DIAGNOSIS — Z01810 Encounter for preprocedural cardiovascular examination: Secondary | ICD-10-CM | POA: Diagnosis not present

## 2017-02-12 HISTORY — DX: Unspecified osteoarthritis, unspecified site: M19.90

## 2017-02-12 HISTORY — DX: Type 2 diabetes mellitus without complications: E11.9

## 2017-02-12 HISTORY — DX: Prediabetes: R73.03

## 2017-02-12 LAB — CBC
HCT: 42.4 % (ref 39.0–52.0)
HEMOGLOBIN: 14.4 g/dL (ref 13.0–17.0)
MCH: 31.9 pg (ref 26.0–34.0)
MCHC: 34 g/dL (ref 30.0–36.0)
MCV: 93.8 fL (ref 78.0–100.0)
PLATELETS: 311 10*3/uL (ref 150–400)
RBC: 4.52 MIL/uL (ref 4.22–5.81)
RDW: 14.8 % (ref 11.5–15.5)
WBC: 7.6 10*3/uL (ref 4.0–10.5)

## 2017-02-12 LAB — GLUCOSE, CAPILLARY: Glucose-Capillary: 83 mg/dL (ref 65–99)

## 2017-02-12 LAB — BASIC METABOLIC PANEL
ANION GAP: 9 (ref 5–15)
BUN: 22 mg/dL — ABNORMAL HIGH (ref 6–20)
CALCIUM: 9.1 mg/dL (ref 8.9–10.3)
CO2: 25 mmol/L (ref 22–32)
CREATININE: 0.83 mg/dL (ref 0.61–1.24)
Chloride: 102 mmol/L (ref 101–111)
GLUCOSE: 73 mg/dL (ref 65–99)
Potassium: 4.5 mmol/L (ref 3.5–5.1)
Sodium: 136 mmol/L (ref 135–145)

## 2017-02-12 LAB — ABO/RH: ABO/RH(D): A POS

## 2017-02-12 LAB — HEMOGLOBIN A1C
HEMOGLOBIN A1C: 5.9 % — AB (ref 4.8–5.6)
Mean Plasma Glucose: 122.63 mg/dL

## 2017-02-12 LAB — SURGICAL PCR SCREEN
MRSA, PCR: NEGATIVE
STAPHYLOCOCCUS AUREUS: NEGATIVE

## 2017-02-17 ENCOUNTER — Inpatient Hospital Stay (HOSPITAL_COMMUNITY): Payer: PPO

## 2017-02-17 ENCOUNTER — Encounter (HOSPITAL_COMMUNITY): Payer: Self-pay

## 2017-02-17 ENCOUNTER — Inpatient Hospital Stay (HOSPITAL_COMMUNITY): Payer: PPO | Admitting: Anesthesiology

## 2017-02-17 ENCOUNTER — Other Ambulatory Visit: Payer: Self-pay

## 2017-02-17 ENCOUNTER — Encounter (HOSPITAL_COMMUNITY)
Admission: RE | Disposition: A | Discharge status: Home / Self Care | Payer: Self-pay | Source: Ambulatory Visit | Attending: Orthopedic Surgery

## 2017-02-17 ENCOUNTER — Inpatient Hospital Stay (HOSPITAL_COMMUNITY)
Admission: RE | Admit: 2017-02-17 | Discharge: 2017-02-18 | DRG: 470 | Disposition: A | Discharge status: Home / Self Care | Payer: PPO | Source: Ambulatory Visit | Attending: Orthopedic Surgery | Admitting: Orthopedic Surgery

## 2017-02-17 DIAGNOSIS — R7303 Prediabetes: Secondary | ICD-10-CM | POA: Diagnosis not present

## 2017-02-17 DIAGNOSIS — Z6827 Body mass index (BMI) 27.0-27.9, adult: Secondary | ICD-10-CM

## 2017-02-17 DIAGNOSIS — Z791 Long term (current) use of non-steroidal anti-inflammatories (NSAID): Secondary | ICD-10-CM | POA: Diagnosis not present

## 2017-02-17 DIAGNOSIS — E119 Type 2 diabetes mellitus without complications: Secondary | ICD-10-CM | POA: Diagnosis not present

## 2017-02-17 DIAGNOSIS — E663 Overweight: Secondary | ICD-10-CM | POA: Diagnosis not present

## 2017-02-17 DIAGNOSIS — M1612 Unilateral primary osteoarthritis, left hip: Secondary | ICD-10-CM | POA: Diagnosis not present

## 2017-02-17 DIAGNOSIS — F17210 Nicotine dependence, cigarettes, uncomplicated: Secondary | ICD-10-CM | POA: Diagnosis present

## 2017-02-17 DIAGNOSIS — Z96642 Presence of left artificial hip joint: Secondary | ICD-10-CM | POA: Diagnosis not present

## 2017-02-17 DIAGNOSIS — M722 Plantar fascial fibromatosis: Secondary | ICD-10-CM | POA: Diagnosis not present

## 2017-02-17 DIAGNOSIS — Z7982 Long term (current) use of aspirin: Secondary | ICD-10-CM

## 2017-02-17 DIAGNOSIS — Z471 Aftercare following joint replacement surgery: Secondary | ICD-10-CM | POA: Diagnosis not present

## 2017-02-17 DIAGNOSIS — Z96649 Presence of unspecified artificial hip joint: Secondary | ICD-10-CM

## 2017-02-17 HISTORY — PX: TOTAL HIP ARTHROPLASTY: SHX124

## 2017-02-17 LAB — GLUCOSE, CAPILLARY
GLUCOSE-CAPILLARY: 125 mg/dL — AB (ref 65–99)
GLUCOSE-CAPILLARY: 268 mg/dL — AB (ref 65–99)
Glucose-Capillary: 126 mg/dL — ABNORMAL HIGH (ref 65–99)
Glucose-Capillary: 177 mg/dL — ABNORMAL HIGH (ref 65–99)

## 2017-02-17 SURGERY — ARTHROPLASTY, HIP, TOTAL, ANTERIOR APPROACH
Anesthesia: Monitor Anesthesia Care | Site: Hip | Laterality: Left

## 2017-02-17 MED ORDER — METOCLOPRAMIDE HCL 5 MG/ML IJ SOLN: 5.0000 mg | Freq: Three times a day (TID) | INTRAMUSCULAR | Status: DC | PRN

## 2017-02-17 MED ORDER — CEFAZOLIN SODIUM-DEXTROSE 2-4 GM/100ML-% IV SOLN
2.0000 g | Freq: Four times a day (QID) | INTRAVENOUS | Status: AC
  Administered 2017-02-17 (×2): 2 g via INTRAVENOUS
  Filled 2017-02-17 (×2): qty 100

## 2017-02-17 MED ORDER — INSULIN ASPART 100 UNIT/ML ~~LOC~~ SOLN
0.0000 [IU] | Freq: Three times a day (TID) | SUBCUTANEOUS | Status: DC
  Administered 2017-02-17: 8 [IU] via SUBCUTANEOUS

## 2017-02-17 MED ORDER — METHOCARBAMOL 500 MG PO TABS: 500.0000 mg | ORAL_TABLET | Freq: Four times a day (QID) | ORAL | Status: DC | PRN

## 2017-02-17 MED ORDER — DOCUSATE SODIUM 100 MG PO CAPS: 100.0000 mg | ORAL_CAPSULE | Freq: Two times a day (BID) | ORAL | 0 refills | Status: DC

## 2017-02-17 MED ORDER — CEFAZOLIN SODIUM-DEXTROSE 2-4 GM/100ML-% IV SOLN
2.0000 g | INTRAVENOUS | Status: AC
  Administered 2017-02-17: 2 g via INTRAVENOUS

## 2017-02-17 MED ORDER — POLYETHYLENE GLYCOL 3350 17 G PO PACK
17.0000 g | PACK | Freq: Two times a day (BID) | ORAL | Status: DC
  Administered 2017-02-17 – 2017-02-18 (×2): 17 g via ORAL
  Filled 2017-02-17 (×2): qty 1

## 2017-02-17 MED ORDER — PHENYLEPHRINE 40 MCG/ML (10ML) SYRINGE FOR IV PUSH (FOR BLOOD PRESSURE SUPPORT)
PREFILLED_SYRINGE | INTRAVENOUS | Status: DC | PRN
  Administered 2017-02-17 (×9): 80 ug via INTRAVENOUS

## 2017-02-17 MED ORDER — TRANEXAMIC ACID 1000 MG/10ML IV SOLN
1000.0000 mg | Freq: Once | INTRAVENOUS | Status: AC
  Administered 2017-02-17: 15:00:00 1000 mg via INTRAVENOUS
  Filled 2017-02-17: qty 1100

## 2017-02-17 MED ORDER — CLOBETASOL PROPIONATE 0.05 % EX CREA
1.0000 "application " | TOPICAL_CREAM | Freq: Every day | CUTANEOUS | Status: DC
  Filled 2017-02-17: qty 15

## 2017-02-17 MED ORDER — SODIUM CHLORIDE 0.9 % IV SOLN
INTRAVENOUS | Status: DC
  Administered 2017-02-17 – 2017-02-18 (×2): via INTRAVENOUS

## 2017-02-17 MED ORDER — DIPHENHYDRAMINE HCL 12.5 MG/5ML PO ELIX: 12.5000 mg | ORAL_SOLUTION | ORAL | Status: DC | PRN

## 2017-02-17 MED ORDER — FERROUS SULFATE 325 (65 FE) MG PO TABS
325.0000 mg | ORAL_TABLET | Freq: Three times a day (TID) | ORAL | Status: DC
  Administered 2017-02-18: 08:00:00 325 mg via ORAL
  Filled 2017-02-17: qty 1

## 2017-02-17 MED ORDER — ONDANSETRON HCL 4 MG PO TABS: 4.0000 mg | ORAL_TABLET | Freq: Four times a day (QID) | ORAL | Status: DC | PRN

## 2017-02-17 MED ORDER — HYDROCODONE-ACETAMINOPHEN 7.5-325 MG PO TABS
1.0000 | ORAL_TABLET | ORAL | Status: DC | PRN
  Administered 2017-02-17 – 2017-02-18 (×4): 1 via ORAL
  Filled 2017-02-17 (×4): qty 1

## 2017-02-17 MED ORDER — FERROUS SULFATE 325 (65 FE) MG PO TABS: 325.0000 mg | ORAL_TABLET | Freq: Three times a day (TID) | ORAL | 3 refills | Status: DC

## 2017-02-17 MED ORDER — METHOCARBAMOL 1000 MG/10ML IJ SOLN
500.0000 mg | Freq: Four times a day (QID) | INTRAVENOUS | Status: DC | PRN
  Administered 2017-02-17: 500 mg via INTRAVENOUS
  Filled 2017-02-17: qty 550

## 2017-02-17 MED ORDER — ACETAMINOPHEN 650 MG RE SUPP: 650.0000 mg | RECTAL | Status: DC | PRN

## 2017-02-17 MED ORDER — LACTATED RINGERS IV SOLN
INTRAVENOUS | Status: DC
  Administered 2017-02-17: 10:00:00 via INTRAVENOUS
  Administered 2017-02-17: 1000 mL via INTRAVENOUS
  Administered 2017-02-17: 11:00:00 via INTRAVENOUS

## 2017-02-17 MED ORDER — CEFAZOLIN SODIUM-DEXTROSE 2-4 GM/100ML-% IV SOLN
INTRAVENOUS | Status: AC
  Filled 2017-02-17: qty 100

## 2017-02-17 MED ORDER — ONDANSETRON HCL 4 MG/2ML IJ SOLN
INTRAMUSCULAR | Status: DC | PRN
  Administered 2017-02-17: 4 mg via INTRAVENOUS

## 2017-02-17 MED ORDER — EPHEDRINE 5 MG/ML INJ
INTRAVENOUS | Status: AC
  Filled 2017-02-17: qty 10

## 2017-02-17 MED ORDER — PROPOFOL 500 MG/50ML IV EMUL
INTRAVENOUS | Status: DC | PRN
  Administered 2017-02-17: 75 ug/kg/min via INTRAVENOUS

## 2017-02-17 MED ORDER — METHOCARBAMOL 500 MG PO TABS: 500.0000 mg | ORAL_TABLET | Freq: Four times a day (QID) | ORAL | 0 refills | Status: DC | PRN

## 2017-02-17 MED ORDER — FENTANYL CITRATE (PF) 100 MCG/2ML IJ SOLN
INTRAMUSCULAR | Status: AC
  Filled 2017-02-17: qty 2

## 2017-02-17 MED ORDER — PROPOFOL 10 MG/ML IV BOLUS
INTRAVENOUS | Status: AC
Start: 2017-02-17 — End: ?
  Filled 2017-02-17: qty 20

## 2017-02-17 MED ORDER — BISACODYL 10 MG RE SUPP: 10.0000 mg | Freq: Every day | RECTAL | Status: DC | PRN

## 2017-02-17 MED ORDER — EPHEDRINE SULFATE-NACL 50-0.9 MG/10ML-% IV SOSY
PREFILLED_SYRINGE | INTRAVENOUS | Status: DC | PRN
  Administered 2017-02-17 (×3): 10 mg via INTRAVENOUS

## 2017-02-17 MED ORDER — METFORMIN HCL ER 500 MG PO TB24
500.0000 mg | ORAL_TABLET | Freq: Two times a day (BID) | ORAL | Status: DC
  Administered 2017-02-18: 500 mg via ORAL
  Filled 2017-02-17: qty 1

## 2017-02-17 MED ORDER — CHLORHEXIDINE GLUCONATE 4 % EX LIQD
60.0000 mL | Freq: Once | CUTANEOUS | Status: DC
Start: 2017-02-17 — End: 2017-02-17

## 2017-02-17 MED ORDER — CELECOXIB 200 MG PO CAPS
200.0000 mg | ORAL_CAPSULE | Freq: Two times a day (BID) | ORAL | Status: DC
  Administered 2017-02-17 – 2017-02-18 (×2): 200 mg via ORAL
  Filled 2017-02-17 (×2): qty 1

## 2017-02-17 MED ORDER — SODIUM CHLORIDE 0.9 % IR SOLN
Status: DC | PRN
  Administered 2017-02-17: 1000 mL

## 2017-02-17 MED ORDER — TRANEXAMIC ACID 1000 MG/10ML IV SOLN
1000.0000 mg | INTRAVENOUS | Status: AC
  Administered 2017-02-17: 1000 mg via INTRAVENOUS
  Filled 2017-02-17: qty 1100

## 2017-02-17 MED ORDER — PROPOFOL 10 MG/ML IV BOLUS
INTRAVENOUS | Status: AC
  Filled 2017-02-17: qty 20

## 2017-02-17 MED ORDER — ASPIRIN 81 MG PO CHEW
81.0000 mg | CHEWABLE_TABLET | Freq: Two times a day (BID) | ORAL | Status: DC
  Administered 2017-02-17 – 2017-02-18 (×2): 81 mg via ORAL
  Filled 2017-02-17 (×2): qty 1

## 2017-02-17 MED ORDER — PHENOL 1.4 % MT LIQD
1.0000 | OROMUCOSAL | Status: DC | PRN
  Filled 2017-02-17: qty 177

## 2017-02-17 MED ORDER — MENTHOL 3 MG MT LOZG: 1.0000 | LOZENGE | OROMUCOSAL | Status: DC | PRN

## 2017-02-17 MED ORDER — PHENYLEPHRINE 40 MCG/ML (10ML) SYRINGE FOR IV PUSH (FOR BLOOD PRESSURE SUPPORT)
PREFILLED_SYRINGE | INTRAVENOUS | Status: AC
  Filled 2017-02-17: qty 10

## 2017-02-17 MED ORDER — ONDANSETRON HCL 4 MG/2ML IJ SOLN
INTRAMUSCULAR | Status: AC
  Filled 2017-02-17: qty 2

## 2017-02-17 MED ORDER — LORATADINE 10 MG PO TABS
10.0000 mg | ORAL_TABLET | Freq: Every day | ORAL | Status: DC
  Administered 2017-02-18: 10 mg via ORAL
  Filled 2017-02-17: qty 1

## 2017-02-17 MED ORDER — DOCUSATE SODIUM 100 MG PO CAPS
100.0000 mg | ORAL_CAPSULE | Freq: Two times a day (BID) | ORAL | Status: DC
  Administered 2017-02-17 – 2017-02-18 (×2): 100 mg via ORAL
  Filled 2017-02-17 (×2): qty 1

## 2017-02-17 MED ORDER — ALUM & MAG HYDROXIDE-SIMETH 200-200-20 MG/5ML PO SUSP: 15.0000 mL | ORAL | Status: DC | PRN

## 2017-02-17 MED ORDER — LORATADINE 10 MG PO TABS: 10.0000 mg | ORAL_TABLET | Freq: Every day | ORAL | Status: DC

## 2017-02-17 MED ORDER — PROPOFOL 10 MG/ML IV BOLUS: INTRAVENOUS | Status: DC | PRN

## 2017-02-17 MED ORDER — METOCLOPRAMIDE HCL 5 MG PO TABS: 5.0000 mg | ORAL_TABLET | Freq: Three times a day (TID) | ORAL | Status: DC | PRN

## 2017-02-17 MED ORDER — BUPIVACAINE IN DEXTROSE 0.75-8.25 % IT SOLN
INTRATHECAL | Status: DC | PRN
  Administered 2017-02-17: 2 mL via INTRATHECAL

## 2017-02-17 MED ORDER — POLYETHYLENE GLYCOL 3350 17 G PO PACK: 17.0000 g | PACK | Freq: Two times a day (BID) | ORAL | 0 refills | Status: DC

## 2017-02-17 MED ORDER — DEXAMETHASONE SODIUM PHOSPHATE 10 MG/ML IJ SOLN
INTRAMUSCULAR | Status: AC
Start: 2017-02-17 — End: ?
  Filled 2017-02-17: qty 1

## 2017-02-17 MED ORDER — DEXAMETHASONE SODIUM PHOSPHATE 10 MG/ML IJ SOLN: 10.0000 mg | Freq: Once | INTRAMUSCULAR | Status: DC

## 2017-02-17 MED ORDER — FENTANYL CITRATE (PF) 100 MCG/2ML IJ SOLN: 25.0000 ug | INTRAMUSCULAR | Status: DC | PRN

## 2017-02-17 MED ORDER — ACETAMINOPHEN 325 MG PO TABS: 650.0000 mg | ORAL_TABLET | ORAL | Status: DC | PRN

## 2017-02-17 MED ORDER — PHENYLEPHRINE 40 MCG/ML (10ML) SYRINGE FOR IV PUSH (FOR BLOOD PRESSURE SUPPORT)
PREFILLED_SYRINGE | INTRAVENOUS | Status: AC
Start: 2017-02-17 — End: ?
  Filled 2017-02-17: qty 10

## 2017-02-17 MED ORDER — STERILE WATER FOR IRRIGATION IR SOLN
Status: DC | PRN
  Administered 2017-02-17: 2000 mL

## 2017-02-17 MED ORDER — HYDROCODONE-ACETAMINOPHEN 7.5-325 MG PO TABS: 1.0000 | ORAL_TABLET | ORAL | 0 refills | Status: DC | PRN

## 2017-02-17 MED ORDER — ONDANSETRON HCL 4 MG/2ML IJ SOLN: 4.0000 mg | Freq: Four times a day (QID) | INTRAMUSCULAR | Status: DC | PRN

## 2017-02-17 MED ORDER — MAGNESIUM CITRATE PO SOLN: 1.0000 | Freq: Once | ORAL | Status: DC | PRN

## 2017-02-17 MED ORDER — LIDOCAINE 2% (20 MG/ML) 5 ML SYRINGE
INTRAMUSCULAR | Status: AC
  Filled 2017-02-17: qty 5

## 2017-02-17 MED ORDER — ASPIRIN 81 MG PO CHEW: 81.0000 mg | CHEWABLE_TABLET | Freq: Two times a day (BID) | ORAL | 0 refills | Status: AC

## 2017-02-17 MED ORDER — DEXAMETHASONE SODIUM PHOSPHATE 10 MG/ML IJ SOLN
10.0000 mg | Freq: Once | INTRAMUSCULAR | Status: AC
  Administered 2017-02-17: 10 mg via INTRAVENOUS

## 2017-02-17 MED ORDER — ATORVASTATIN CALCIUM 20 MG PO TABS: 20.0000 mg | ORAL_TABLET | ORAL | Status: DC

## 2017-02-17 MED ORDER — HYDROMORPHONE HCL 1 MG/ML IJ SOLN: 0.5000 mg | INTRAMUSCULAR | Status: DC | PRN

## 2017-02-17 MED ORDER — FENTANYL CITRATE (PF) 100 MCG/2ML IJ SOLN
INTRAMUSCULAR | Status: DC | PRN
  Administered 2017-02-17 (×2): 50 ug via INTRAVENOUS

## 2017-02-17 MED ORDER — HYDROCODONE-ACETAMINOPHEN 7.5-325 MG PO TABS: 2.0000 | ORAL_TABLET | ORAL | Status: DC | PRN

## 2017-02-17 MED ORDER — PROMETHAZINE HCL 25 MG/ML IJ SOLN: 6.2500 mg | INTRAMUSCULAR | Status: DC | PRN

## 2017-02-17 SURGICAL SUPPLY — 35 items
BAG DECANTER FOR FLEXI CONT (MISCELLANEOUS) IMPLANT
BAG ZIPLOCK 12X15 (MISCELLANEOUS) IMPLANT
BLADE SAG 18X100X1.27 (BLADE) ×3 IMPLANT
CAPT HIP TOTAL 2 ×3 IMPLANT
CLOTH BEACON ORANGE TIMEOUT ST (SAFETY) ×3 IMPLANT
COVER PERINEAL POST (MISCELLANEOUS) ×3 IMPLANT
COVER SURGICAL LIGHT HANDLE (MISCELLANEOUS) ×3 IMPLANT
DERMABOND ADVANCED (GAUZE/BANDAGES/DRESSINGS) ×2
DERMABOND ADVANCED .7 DNX12 (GAUZE/BANDAGES/DRESSINGS) ×1 IMPLANT
DRAPE STERI IOBAN 125X83 (DRAPES) ×3 IMPLANT
DRAPE U-SHAPE 47X51 STRL (DRAPES) ×6 IMPLANT
DRESSING AQUACEL AG SP 3.5X10 (GAUZE/BANDAGES/DRESSINGS) ×1 IMPLANT
DRSG AQUACEL AG SP 3.5X10 (GAUZE/BANDAGES/DRESSINGS) ×3
DURAPREP 26ML APPLICATOR (WOUND CARE) ×3 IMPLANT
ELECT REM PT RETURN 15FT ADLT (MISCELLANEOUS) ×3 IMPLANT
GLOVE BIOGEL M STRL SZ7.5 (GLOVE) IMPLANT
GLOVE BIOGEL PI IND STRL 7.5 (GLOVE) ×5 IMPLANT
GLOVE BIOGEL PI IND STRL 8.5 (GLOVE) ×1 IMPLANT
GLOVE BIOGEL PI INDICATOR 7.5 (GLOVE) ×10
GLOVE BIOGEL PI INDICATOR 8.5 (GLOVE) ×2
GLOVE ECLIPSE 8.0 STRL XLNG CF (GLOVE) ×6 IMPLANT
GLOVE ORTHO TXT STRL SZ7.5 (GLOVE) ×3 IMPLANT
GOWN STRL REUS W/TWL LRG LVL3 (GOWN DISPOSABLE) ×3 IMPLANT
GOWN STRL REUS W/TWL XL LVL3 (GOWN DISPOSABLE) ×3 IMPLANT
HOLDER FOLEY CATH W/STRAP (MISCELLANEOUS) ×3 IMPLANT
PACK ANTERIOR HIP CUSTOM (KITS) ×3 IMPLANT
SUT MNCRL AB 4-0 PS2 18 (SUTURE) ×3 IMPLANT
SUT STRATAFIX 0 PDS 27 VIOLET (SUTURE) ×3
SUT VIC AB 1 CT1 36 (SUTURE) ×9 IMPLANT
SUT VIC AB 2-0 CT1 27 (SUTURE) ×4
SUT VIC AB 2-0 CT1 TAPERPNT 27 (SUTURE) ×2 IMPLANT
SUTURE STRATFX 0 PDS 27 VIOLET (SUTURE) ×1 IMPLANT
TRAY FOLEY W/METER SILVER 16FR (SET/KITS/TRAYS/PACK) IMPLANT
WATER STERILE IRR 1000ML POUR (IV SOLUTION) ×3 IMPLANT
YANKAUER SUCT BULB TIP 10FT TU (MISCELLANEOUS) IMPLANT

## 2017-02-17 NOTE — Anesthesia Preprocedure Evaluation (Addendum)
Anesthesia Evaluation  Patient identified by MRN, date of birth, ID band Patient awake    Reviewed: Allergy & Precautions, NPO status , Patient's Chart, lab work & pertinent test results  History of Anesthesia Complications Negative for: history of anesthetic complications  Airway Mallampati: II  TM Distance: >3 FB Neck ROM: Full    Dental  (+) Upper Dentures, Dental Advisory Given   Pulmonary Current Smoker,    Pulmonary exam normal        Cardiovascular negative cardio ROS Normal cardiovascular exam     Neuro/Psych negative neurological ROS  negative psych ROS   GI/Hepatic negative GI ROS, Neg liver ROS,   Endo/Other  negative endocrine ROSdiabetes  Renal/GU negative Renal ROS  negative genitourinary   Musculoskeletal negative musculoskeletal ROS (+)   Abdominal   Peds negative pediatric ROS (+)  Hematology negative hematology ROS (+)   Anesthesia Other Findings   Reproductive/Obstetrics negative OB ROS                            Anesthesia Physical Anesthesia Plan  ASA: II  Anesthesia Plan: MAC and Spinal   Post-op Pain Management:    Induction:   PONV Risk Score and Plan: 2 and Ondansetron and Propofol infusion  Airway Management Planned: Natural Airway and Simple Face Mask  Additional Equipment:   Intra-op Plan:   Post-operative Plan:   Informed Consent: I have reviewed the patients History and Physical, chart, labs and discussed the procedure including the risks, benefits and alternatives for the proposed anesthesia with the patient or authorized representative who has indicated his/her understanding and acceptance.   Dental advisory given  Plan Discussed with: CRNA and Anesthesiologist  Anesthesia Plan Comments:        Anesthesia Quick Evaluation

## 2017-02-17 NOTE — Transfer of Care (Signed)
Immediate Anesthesia Transfer of Care Note  Patient: TATEM FESLER  Procedure(s) Performed: LEFT TOTAL HIP ARTHROPLASTY ANTERIOR APPROACH (Left Hip)  Patient Location: PACU  Anesthesia Type:Spinal  Level of Consciousness: awake, alert  and oriented  Airway & Oxygen Therapy: Patient Spontanous Breathing and Patient connected to face mask oxygen  Post-op Assessment: Report given to RN and Post -op Vital signs reviewed and stable  Post vital signs: Reviewed and stable  Last Vitals:  Vitals:   02/17/17 0742  BP: (!) 147/74  Pulse: 88  Resp: 16  Temp: (!) 36.4 C  SpO2: 99%    Last Pain:  Vitals:   02/17/17 0742  TempSrc: Oral      Patients Stated Pain Goal: 4 (38/[id number]/73 [id number])  Complications: No apparent anesthesia complications

## 2017-02-17 NOTE — Evaluation (Signed)
Physical Therapy Evaluation Patient Details Name: Timothy Brewer MRN: [id number] DOB: [birth year] Today's Date: 02/17/2017   History of Present Illness  LDATHA  Clinical Impression  The patient ambu;ated x 100', no pain complaints Pt admitted with above diagnosis. Pt currently with functional limitations due to the deficits listed below (see PT Problem List). Pt will benefit from skilled PT to increase their independence and safety with mobility to allow discharge to the venue listed below.       Follow Up Recommendations DC plan and follow up therapy as arranged by surgeon    Equipment Recommendations  None recommended by PT    Recommendations for Other Services       Precautions / Restrictions Precautions Precautions: Fall      Mobility  Bed Mobility Overal bed mobility: Needs Assistance Bed Mobility: Supine to Sit     Supine to sit: Supervision     General bed mobility comments: no assist required  Transfers Overall transfer level: Needs assistance Equipment used: Rolling walker (2 wheeled) Transfers: Sit to/from Stand Sit to Stand: Min assist         General transfer comment: cues for technique  Ambulation/Gait Ambulation/Gait assistance: Min assist Ambulation Distance (Feet): 100 Feet Assistive device: Rolling walker (2 wheeled) Gait Pattern/deviations: Step-through pattern;Antalgic     General Gait Details: cues for sequence  Stairs            Wheelchair Mobility    Modified Rankin (Stroke Patients Only)       Balance                                             Pertinent Vitals/Pain Pain Assessment: No/denies pain    Home Living Family/patient expects to be discharged to:: Private residence Living Arrangements: Alone Available Help at Discharge: Friend(s);Available 24 hours/day Type of Home: Apartment Home Access: Stairs to enter Entrance Stairs-Rails: Right Entrance Stairs-Number of Steps: 15 Home Layout:  One level Home Equipment: Environmental consultant - 2 wheels Additional Comments: plans to stay with friend with no steps    Prior Function Level of Independence: Independent               Hand Dominance        Extremity/Trunk Assessment   Upper Extremity Assessment Upper Extremity Assessment: Overall WFL for tasks assessed    Lower Extremity Assessment Lower Extremity Assessment: LLE deficits/detail RLE Deficits / Details: advances the leg       Communication   Communication: No difficulties  Cognition Arousal/Alertness: Awake/alert Behavior During Therapy: WFL for tasks assessed/performed Overall Cognitive Status: Within Functional Limits for tasks assessed                                        General Comments      Exercises     Assessment/Plan    PT Assessment Patient needs continued PT services  PT Problem List Decreased strength;Decreased range of motion;Decreased activity tolerance;Decreased mobility;Decreased safety awareness;Decreased knowledge of precautions;Decreased knowledge of use of DME       PT Treatment Interventions DME instruction;Gait training;Stair training;Functional mobility training;Patient/family education;Therapeutic activities;Therapeutic exercise    PT Goals (Current goals can be found in the Care Plan section)  Acute Rehab PT Goals Patient Stated Goal: to go home PT Goal Formulation:  With patient Time For Goal Achievement: 02/19/17 Potential to Achieve Goals: Good    Frequency 7X/week   Barriers to discharge        Co-evaluation               AM-PAC PT "6 Clicks" Daily Activity  Outcome Measure Difficulty turning over in bed (including adjusting bedclothes, sheets and blankets)?: None Difficulty moving from lying on back to sitting on the side of the bed? : A Little Difficulty sitting down on and standing up from a chair with arms (e.g., wheelchair, bedside commode, etc,.)?: A Little Help needed moving to and  from a bed to chair (including a wheelchair)?: A Little Help needed walking in hospital room?: A Little Help needed climbing 3-5 steps with a railing? : A Lot 6 Click Score: 18    End of Session   Activity Tolerance: Patient tolerated treatment well Patient left: in chair;with call bell/phone within reach;with chair alarm set Nurse Communication: Mobility status PT Visit Diagnosis: Difficulty in walking, not elsewhere classified (R26.2)    Time: 1610-9604 PT Time Calculation (min) (ACUTE ONLY): 20 min   Charges:   PT Evaluation $PT Eval Low Complexity: 1 Low     PT G CodesTresa Brewer PT [phone]   Timothy Brewer 02/17/2017, 5:52 PM

## 2017-02-17 NOTE — Interval H&P Note (Signed)
History and Physical Interval Note:  02/17/2017 8:39 AM  Timothy Brewer  has presented today for surgery, with the diagnosis of Left hip osteoarthritis  The various methods of treatment have been discussed with the patient and family. After consideration of risks, benefits and other options for treatment, the patient has consented to  Procedure(s) with comments: LEFT TOTAL HIP ARTHROPLASTY ANTERIOR APPROACH (Left) - 70 mins as a surgical intervention .  The patient's history has been reviewed, patient examined, no change in status, stable for surgery.  I have reviewed the patient's chart and labs.  Questions were answered to the patient's satisfaction.     Mauri Pole

## 2017-02-17 NOTE — Anesthesia Procedure Notes (Signed)
Spinal  Patient location during procedure: OR Start time: 02/17/2017 9:44 AM End time: 02/17/2017 9:57 AM Staffing Anesthesiologist: Duane Boston, MD Performed: anesthesiologist  Preanesthetic Checklist Completed: patient identified, surgical consent, pre-op evaluation, timeout performed, IV checked, risks and benefits discussed and monitors and equipment checked Spinal Block Patient position: sitting Prep: DuraPrep Patient monitoring: cardiac monitor, continuous pulse ox and blood pressure Approach: midline Location: L2-3 Injection technique: single-shot Needle Needle type: [place]  Needle gauge: 22 G Needle length: 9 cm Additional Notes Functioning IV was confirmed and monitors were applied. Sterile prep and drape, including hand hygiene and sterile gloves were used. The patient was positioned and the spine was prepped. The skin was anesthetized with lidocaine.  Free flow of clear CSF was obtained prior to injecting local anesthetic into the CSF.  The spinal needle aspirated freely following injection.  The needle was carefully withdrawn.  The patient tolerated the procedure well. Difficult placement.

## 2017-02-17 NOTE — Anesthesia Postprocedure Evaluation (Signed)
Anesthesia Post Note  Patient: Timothy Brewer  Procedure(s) Performed: LEFT TOTAL HIP ARTHROPLASTY ANTERIOR APPROACH (Left Hip)     Patient location during evaluation: PACU Anesthesia Type: MAC and Spinal Level of consciousness: awake and alert Pain management: pain level controlled Vital Signs Assessment: post-procedure vital signs reviewed and stable Respiratory status: spontaneous breathing and respiratory function stable Cardiovascular status: blood pressure returned to baseline and stable Postop Assessment: spinal receding Anesthetic complications: no    Last Vitals:  Vitals:   02/17/17 1200 02/17/17 1215  BP: (!) 111/56 118/67  Pulse: 78 83  Resp: 19 10  Temp:  36.4 C  SpO2: 100% 100%    Last Pain:  Vitals:   02/17/17 0742  TempSrc: Oral                 Whitley Strycharz DANIEL

## 2017-02-17 NOTE — Op Note (Signed)
NAME:  Timothy Brewer.: [id number]      MEDICAL RECORD NO.: [id number]      FACILITY:  [facility]      PHYSICIAN:  Mauri Pole  DATE OF BIRTH:  April 09, 1940     DATE OF PROCEDURE:  02/17/2017                                 OPERATIVE REPORT         PREOPERATIVE DIAGNOSIS: Left  hip osteoarthritis.      POSTOPERATIVE DIAGNOSIS:  Left hip osteoarthritis.      PROCEDURE:  Left total hip replacement through an anterior approach   utilizing DePuy THR system, component size 56mm pinnacle cup, a size 36+4 neutral   Altrex liner, a size 3 Hi Tri Lock stem with a 36+5 delta ceramic   ball.      SURGEON:  Pietro Cassis. Alvan Dame, M.D.      ASSISTANT:  Danae Orleans, PA-C     ANESTHESIA:  Spinal.      SPECIMENS:  None.      COMPLICATIONS:  None.      BLOOD LOSS:  900 cc     DRAINS:  None.      INDICATION OF THE PROCEDURE:  Timothy Brewer is a 75 y.o. male who had   presented to office for evaluation of left hip pain.  Radiographs revealed   progressive degenerative changes with bone-on-bone   articulation to the  hip joint.  The patient had painful limited range of   motion significantly affecting their overall quality of life.  The patient was failing to    respond to conservative measures, and at this point was ready   to proceed with more definitive measures.  The patient has noted progressive   degenerative changes in his hip, progressive problems and dysfunction   with regarding the hip prior to surgery.  Consent was obtained for   benefit of pain relief.  Specific risk of infection, DVT, component   failure, dislocation, need for revision surgery, as well discussion of   the anterior versus posterior approach were reviewed.  Consent was   obtained for benefit of anterior pain relief through an anterior   approach.      PROCEDURE IN DETAIL:  The patient was brought to operative theater.   Once adequate anesthesia, preoperative  antibiotics, 2 gm of Ancef, 1 gm of Tranexamic Acid, and 10 mg of Decadron administered.   The patient was positioned supine on the OSI Hanna table.  Once adequate   padding of boney process was carried out, we had predraped out the hip, and  used fluoroscopy to confirm orientation of the pelvis and position.      The left hip was then prepped and draped from proximal iliac crest to   mid thigh with shower curtain technique.      Time-out was performed identifying the patient, planned procedure, and   extremity.     An incision was then made 2 cm distal and lateral to the   anterior superior iliac spine extending over the orientation of the   tensor fascia lata muscle and sharp dissection was carried down to the   fascia of the muscle and protractor placed in the soft tissues.      The  fascia was then incised.  The muscle belly was identified and swept   laterally and retractor placed along the superior neck.  Following   cauterization of the circumflex vessels and removing some pericapsular   fat, a second cobra retractor was placed on the inferior neck.  A third   retractor was placed on the anterior acetabulum after elevating the   anterior rectus.  A L-capsulotomy was along the line of the   superior neck to the trochanteric fossa, then extended proximally and   distally.  Tag sutures were placed and the retractors were then placed   intracapsular.  We then identified the trochanteric fossa and   orientation of my neck cut, confirmed this radiographically   and then made a neck osteotomy with the femur on traction.  The femoral   head was removed without difficulty or complication.  Traction was let   off and retractors were placed posterior and anterior around the   acetabulum.      The labrum and foveal tissue were debrided.  I began reaming with a 47mm   reamer and reamed up to 43mm reamer with good bony bed preparation and a 54mm   cup was chosen.  The final 53mm Pinnacle cup  was then impacted under fluoroscopy  to confirm the depth of penetration and orientation with respect to   abduction.  A screw was placed followed by the hole eliminator.  The final   36+4 neutral Altrex liner was impacted with good visualized rim fit.  The cup was positioned anatomically within the acetabular portion of the pelvis.      At this point, the femur was rolled at 80 degrees.  Further capsule was   released off the inferior aspect of the femoral neck.  I then   released the superior capsule proximally.  The hook was placed laterally   along the femur and elevated manually and held in position with the bed   hook.  The leg was then extended and adducted with the leg rolled to 100   degrees of external rotation.  Once the proximal femur was fully   exposed, I used a box osteotome to set orientation.  I then began   broaching with the starting chili pepper broach and passed this by hand and then broached up to 3.  With the 3 broach in place I chose a high offset neck and did several trial reductions .  The offset was appropriate, leg lengths   appeared to be equal best matched it appeared with the +5 head ball confirmed radiographically.   Given these findings, I went ahead and dislocated the hip, repositioned all   retractors and positioned the right hip in the extended and abducted position.  The final 3 Hi Tri Lock stem was   chosen and it was impacted down to the level of neck cut.  Based on this   and the trial reduction, a 36+5 delta ceramic ball was chosen and   impacted onto a clean and dry trunnion, and the hip was reduced.  The   hip had been irrigated throughout the case again at this point.  I did   reapproximate the superior capsular leaflet to the anterior leaflet   using #1 Vicryl.  The fascia of the   tensor fascia lata muscle was then reapproximated using #1 Vicryl and #0 Stratafix sutures.  The   remaining wound was closed with 2-0 Vicryl and running 4-0 Monocryl.    The  hip was cleaned, dried, and dressed sterilely using Dermabond and   Aquacel dressing.  Hee was then brought   to recovery room in stable condition tolerating the procedure well.    Danae Orleans, PA-C was present for the entirety of the case involved from   preoperative positioning, perioperative retractor management, general   facilitation of the case, as well as primary wound closure as assistant.            Pietro Cassis Alvan Dame, M.D.        02/17/2017 11:41 AM

## 2017-02-18 ENCOUNTER — Encounter (HOSPITAL_COMMUNITY): Payer: Self-pay | Admitting: Orthopedic Surgery

## 2017-02-18 DIAGNOSIS — E663 Overweight: Secondary | ICD-10-CM | POA: Diagnosis present

## 2017-02-18 LAB — CBC
HCT: 29.9 % — ABNORMAL LOW (ref 39.0–52.0)
Hemoglobin: 10.1 g/dL — ABNORMAL LOW (ref 13.0–17.0)
MCH: 31.8 pg (ref 26.0–34.0)
MCHC: 33.8 g/dL (ref 30.0–36.0)
MCV: 94 fL (ref 78.0–100.0)
PLATELETS: 233 10*3/uL (ref 150–400)
RBC: 3.18 MIL/uL — ABNORMAL LOW (ref 4.22–5.81)
RDW: 14.7 % (ref 11.5–15.5)
WBC: 13.6 10*3/uL — AB (ref 4.0–10.5)

## 2017-02-18 LAB — BASIC METABOLIC PANEL
ANION GAP: 4 — AB (ref 5–15)
BUN: 19 mg/dL (ref 6–20)
CALCIUM: 8.4 mg/dL — AB (ref 8.9–10.3)
CO2: 23 mmol/L (ref 22–32)
Chloride: 111 mmol/L (ref 101–111)
Creatinine, Ser: 0.78 mg/dL (ref 0.61–1.24)
Glucose, Bld: 144 mg/dL — ABNORMAL HIGH (ref 65–99)
Potassium: 4.3 mmol/L (ref 3.5–5.1)
SODIUM: 138 mmol/L (ref 135–145)

## 2017-02-18 LAB — TYPE AND SCREEN
ABO/RH(D): A POS
ANTIBODY SCREEN: NEGATIVE

## 2017-02-18 LAB — GLUCOSE, CAPILLARY: Glucose-Capillary: 133 mg/dL — ABNORMAL HIGH (ref 65–99)

## 2017-02-18 NOTE — Progress Notes (Signed)
Physical Therapy Treatment Patient Details Name: Timothy Brewer MRN: [id number] DOB: February 10, 1941 Today's Date: 02/18/2017    History of Present Illness LDATHA    PT Comments    POD # 1 Pt progressing well.  Assisted with amb a greater distance, practiced stair and a curb then returned to room and performed THR TE's following HEP handout.  Instructed on proper tech, freq as well as use of ICE.  Pt asked about tub transfer.  Demonstrated and educated on proper tech using a long tub bench.    Pt has met goals to D/C to home after one session.   Follow Up Recommendations  DC plan and follow up therapy as arranged by surgeon     Equipment Recommendations  None recommended by PT    Recommendations for Other Services       Precautions / Restrictions Precautions Precautions: Fall Restrictions Weight Bearing Restrictions: No    Mobility  Bed Mobility               General bed mobility comments: OOB in recliner   Transfers Overall transfer level: Needs assistance Equipment used: Rolling walker (2 wheeled) Transfers: Sit to/from Stand Sit to Stand: Supervision         General transfer comment: cues for technique  Ambulation/Gait Ambulation/Gait assistance: Supervision;Min guard Ambulation Distance (Feet): 145 Feet Assistive device: Rolling walker (2 wheeled) Gait Pattern/deviations: Step-through pattern;Antalgic Gait velocity: WFL   General Gait Details: cues for sequence   Stairs Stairs: Yes     Number of Stairs: 3 General stair comments: up one step (curb) with walker and up 2 steps one rail   Wheelchair Mobility    Modified Rankin (Stroke Patients Only)       Balance                                            Cognition Arousal/Alertness: Awake/alert Behavior During Therapy: WFL for tasks assessed/performed Overall Cognitive Status: Within Functional Limits for tasks assessed                                         Exercises      General Comments        Pertinent Vitals/Pain Pain Assessment: 0-10 Pain Score: 4  Pain Location: hip Pain Descriptors / Indicators: Operative site guarding;Sore Pain Intervention(s): Monitored during session;Repositioned;Ice applied    Home Living                      Prior Function            PT Goals (current goals can now be found in the care plan section) Progress towards PT goals: Progressing toward goals    Frequency    7X/week      PT Plan Current plan remains appropriate    Co-evaluation              AM-PAC PT "6 Clicks" Daily Activity  Outcome Measure  Difficulty turning over in bed (including adjusting bedclothes, sheets and blankets)?: None Difficulty moving from lying on back to sitting on the side of the bed? : A Little Difficulty sitting down on and standing up from a chair with arms (e.g., wheelchair, bedside commode, etc,.)?: A Little Help needed moving to and from  a bed to chair (including a wheelchair)?: A Little Help needed walking in hospital room?: A Little Help needed climbing 3-5 steps with a railing? : A Lot 6 Click Score: 18    End of Session Equipment Utilized During Treatment: Gait belt Activity Tolerance: Patient tolerated treatment well Patient left: in chair;with call bell/phone within reach;with chair alarm set Nurse Communication: (pt ready for D/C to home) PT Visit Diagnosis: Difficulty in walking, not elsewhere classified (R26.2)     Time: 0915-1000 PT Time Calculation (min) (ACUTE ONLY): 45 min  Charges:  $Gait Training: 8-22 mins $Therapeutic Exercise: 8-22 mins $Therapeutic Activity: 8-22 mins                    G Codes:       Rica Koyanagi  PTA [facility]  Acute  Rehab Pager      [phone]

## 2017-02-18 NOTE — Progress Notes (Signed)
     Subjective: 1 Day Post-Op Procedure(s) (LRB): LEFT TOTAL HIP ARTHROPLASTY ANTERIOR APPROACH (Left)   Patient reports pain as none, states that he has no significant pain.  No events throughout the night.  Looking forward to working with PT again, felt he did well yesterday.  Ready to be discharged home.   Objective:   VITALS:   Vitals:   02/18/17 0106 02/18/17 0525  BP: 123/62 (!) 130/53  Pulse: 87 81  Resp: 18 16  Temp: 97.7 F (36.5 C) 97.8 F (36.6 C)  SpO2: 97% 97%    Dorsiflexion/Plantar flexion intact Incision: dressing C/D/I No cellulitis present Compartment soft  LABS Recent Labs    02/18/17 0556  HGB 10.1*  HCT 29.9*  WBC 13.6*  PLT 233    Recent Labs    02/18/17 0556  NA 138  K 4.3  BUN 19  CREATININE 0.78  GLUCOSE 144*     Assessment/Plan: 1 Day Post-Op Procedure(s) (LRB): LEFT TOTAL HIP ARTHROPLASTY ANTERIOR APPROACH (Left) Foley cath d/c'ed Advance diet Up with therapy D/C IV fluids Discharge home Follow up in 2 weeks at [facility]. Follow up with OLIN,Britiney Blahnik D in 2 weeks.  Contact information:  [facility] [address] [place] [phone]    Overweight (BMI 25-29.9) Estimated body mass index is 27.88 kg/m as calculated from the following:   Height as of this encounter: 5\' 7"  (1.702 m).   Weight as of this encounter: 80.7 kg (178 lb). Patient also counseled that weight may inhibit the healing process Patient counseled that losing weight will help with future health issues      West Pugh. Aizley Stenseth   PAC  02/18/2017, 9:12 AM

## 2017-02-20 NOTE — Discharge Summary (Signed)
Physician Discharge Summary  Patient ID: Timothy Brewer MRN: [id number] DOB/AGE: [birth year] 76 y.o.  Admit date: 02/17/2017 Discharge date: 02/18/2017   Procedures:  Procedure(s) (LRB): LEFT TOTAL HIP ARTHROPLASTY ANTERIOR APPROACH (Left)  Attending Physician:  Dr. Paralee Cancel   Admission Diagnoses:   Left hip primary OA / pain  Discharge Diagnoses:  Principal Problem:   S/P left THA, AA Active Problems:   Overweight (BMI 25.0-29.9)  Past Medical History:  Diagnosis Date  . Arthritis   . Pre-diabetes    on metformin    HPI:    Timothy Brewer, 76 y.o. male, has a history of pain and functional disability in the left hip(s) due to arthritis and patient has failed non-surgical conservative treatments for greater than 12 weeks to include NSAID's and/or analgesics, corticosteriod injections and activity modification.  Onset of symptoms was gradual starting 3+ years ago with gradually worsening course since that time.The patient noted no past surgery on the left hip(s).  Patient currently rates pain in the left hip at 8 out of 10 with activity. Patient has night pain, worsening of pain with activity and weight bearing, trendelenberg gait, pain that interfers with activities of daily living and pain with passive range of motion. Patient has evidence of periarticular osteophytes and joint space narrowing by imaging studies. This condition presents safety issues increasing the risk of falls. There is no current active infection.  Risks, benefits and expectations were discussed with the patient.  Risks including but not limited to the risk of anesthesia, blood clots, nerve damage, blood vessel damage, failure of the prosthesis, infection and up to and including death.  Patient understand the risks, benefits and expectations and wishes to proceed with surgery.   PCP: Antony Contras, MD   Discharged Condition: good  Hospital Course:  Patient underwent the above stated procedure on  02/17/2017. Patient tolerated the procedure well and brought to the recovery room in good condition and subsequently to the floor.  POD #1 BP: 130/53 ; Pulse: 81 ; Temp: 97.8 F (36.6 C) ; Resp: 16 Patient reports pain as none, states that he has no significant pain.  No events throughout the night.  Looking forward to working with PT again, felt he did well yesterday.  Ready to be discharged home. Dorsiflexion/plantar flexion intact, incision: dressing C/D/I, no cellulitis present and compartment soft.   LABS  Basename    HGB     10.1  HCT     29.9    Discharge Exam: General appearance: alert, cooperative and no distress Extremities: Homans sign is negative, no sign of DVT, no edema, redness or tenderness in the calves or thighs and no ulcers, gangrene or trophic changes  Disposition: Home with follow up in 2 weeks   Follow-up Information    Paralee Cancel, MD. Schedule an appointment as soon as possible for a visit in 2 week(s).   Specialty:  Orthopedic Surgery Contact information: [address] [place] [address] [phone]           Discharge Instructions    Call MD / Call 911   Complete by:  As directed    If you experience chest pain or shortness of breath, CALL 911 and be transported to the hospital emergency room.  If you develope a fever above 101 F, pus (white drainage) or increased drainage or redness at the wound, or calf pain, call your surgeon's office.   Change dressing   Complete by:  As directed  Maintain surgical dressing until follow up in the clinic. If the edges start to pull up, may reinforce with tape. If the dressing is no longer working, may remove and cover with gauze and tape, but must keep the area dry and clean.  Call with any questions or concerns.   Constipation Prevention   Complete by:  As directed    Drink plenty of fluids.  Prune juice may be helpful.  You may use a stool softener, such as Colace (over the counter)  100 mg twice a day.  Use MiraLax (over the counter) for constipation as needed.   Diet - low sodium heart healthy   Complete by:  As directed    Discharge instructions   Complete by:  As directed    Maintain surgical dressing until follow up in the clinic. If the edges start to pull up, may reinforce with tape. If the dressing is no longer working, may remove and cover with gauze and tape, but must keep the area dry and clean.  Follow up in 2 weeks at [facility]. Call with any questions or concerns.   Increase activity slowly as tolerated   Complete by:  As directed    Weight bearing as tolerated with assist device (walker, cane, etc) as directed, use it as long as suggested by your surgeon or therapist, typically at least 4-6 weeks.   TED hose   Complete by:  As directed    Use stockings (TED hose) for 2 weeks on both leg(s).  You may remove them at night for sleeping.      Allergies as of 02/18/2017      Reactions   Iodine Hives, Other (See Comments)   Internal only      Medication List    STOP taking these medications   aspirin EC 81 MG tablet Replaced by:  aspirin 81 MG chewable tablet   meloxicam 15 MG tablet Commonly known as:  MOBIC     TAKE these medications   aspirin 81 MG chewable tablet Commonly known as:  ASPIRIN CHILDRENS Chew 1 tablet (81 mg total) by mouth 2 (two) times daily. Take for 4 weeks, then resume regular dose. Replaces:  aspirin EC 81 MG tablet   atorvastatin 20 MG tablet Commonly known as:  LIPITOR Take 20 mg by mouth 2 (two) times a week.   cetirizine 10 MG tablet Commonly known as:  ZYRTEC Take 10 mg by mouth at bedtime.   clobetasol cream 0.05 % Commonly known as:  TEMOVATE Apply 1 application topically daily.   docusate sodium 100 MG capsule Commonly known as:  COLACE Take 1 capsule (100 mg total) by mouth 2 (two) times daily.   ferrous sulfate 325 (65 FE) MG tablet Commonly known as:  FERROUSUL Take 1 tablet (325 mg  total) by mouth 3 (three) times daily with meals.   fexofenadine 180 MG tablet Commonly known as:  ALLEGRA Take 180 mg by mouth daily.   FIBER-CAPS PO Take 1 capsule by mouth 2 (two) times daily.   HYDROcodone-acetaminophen 7.5-325 MG tablet Commonly known as:  NORCO Take 1-2 tablets by mouth every 4 (four) hours as needed for moderate pain or severe pain.   metFORMIN 500 MG 24 hr tablet Commonly known as:  GLUCOPHAGE-XR Take 500 mg by mouth 2 (two) times daily.   methocarbamol 500 MG tablet Commonly known as:  ROBAXIN Take 1 tablet (500 mg total) by mouth every 6 (six) hours as needed for muscle spasms.  multivitamin tablet Take 1 tablet by mouth daily.   OVER THE COUNTER MEDICATION Take 2 tablets by mouth daily. Active PK Supplement   polyethylene glycol packet Commonly known as:  MIRALAX / GLYCOLAX Take 17 g by mouth 2 (two) times daily.   PRESERVISION AREDS 2 PO Take 1 capsule by mouth 2 (two) times daily.            Discharge Care Instructions  (From admission, onward)        Start     Ordered   02/18/17 0000  Change dressing    Comments:  Maintain surgical dressing until follow up in the clinic. If the edges start to pull up, may reinforce with tape. If the dressing is no longer working, may remove and cover with gauze and tape, but must keep the area dry and clean.  Call with any questions or concerns.   02/18/17 9381       Signed: West Pugh. Jaykwon Morones   PA-C  02/20/2017, 3:51 PM

## 2017-03-03 ENCOUNTER — Emergency Department (HOSPITAL_COMMUNITY)
Admission: EM | Admit: 2017-03-03 | Discharge: 2017-03-03 | Disposition: A | Discharge status: Home / Self Care | Payer: PPO | Attending: Emergency Medicine | Admitting: Emergency Medicine

## 2017-03-03 ENCOUNTER — Encounter (HOSPITAL_COMMUNITY): Payer: Self-pay | Admitting: Emergency Medicine

## 2017-03-03 ENCOUNTER — Emergency Department (HOSPITAL_COMMUNITY): Payer: PPO

## 2017-03-03 DIAGNOSIS — Z7982 Long term (current) use of aspirin: Secondary | ICD-10-CM | POA: Diagnosis not present

## 2017-03-03 DIAGNOSIS — Z79899 Other long term (current) drug therapy: Secondary | ICD-10-CM | POA: Diagnosis not present

## 2017-03-03 DIAGNOSIS — G8918 Other acute postprocedural pain: Secondary | ICD-10-CM | POA: Insufficient documentation

## 2017-03-03 DIAGNOSIS — Z888 Allergy status to other drugs, medicaments and biological substances status: Secondary | ICD-10-CM | POA: Diagnosis not present

## 2017-03-03 DIAGNOSIS — F17210 Nicotine dependence, cigarettes, uncomplicated: Secondary | ICD-10-CM | POA: Diagnosis not present

## 2017-03-03 DIAGNOSIS — M25552 Pain in left hip: Secondary | ICD-10-CM

## 2017-03-03 DIAGNOSIS — Z96642 Presence of left artificial hip joint: Secondary | ICD-10-CM | POA: Diagnosis not present

## 2017-03-03 DIAGNOSIS — Z7984 Long term (current) use of oral hypoglycemic drugs: Secondary | ICD-10-CM | POA: Diagnosis not present

## 2017-03-03 NOTE — ED Provider Notes (Signed)
[facility] DEPT Provider Note   CSN: [id number] Arrival date & time: 03/03/17  1236     History   Chief Complaint Chief Complaint  Patient presents with  . Hip Pain    HPI Timothy Brewer is a 76 y.o. 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 Alvan Dame, who presents to the ED with complaints of left hip pain that began 5 days ago.  Patient states that he had been doing well after his hip replacement, was no longer needing his walker or his cane, was requiring less pain medication, and overall doing fairly well.  He was kneeling while getting his laundry out, and when he went to get up he felt a pop in his left hip and the pain began.  Since then he is continued to have pain in his left hip and has required the use of his cane again.  He describes his pain is 7/10 intermittent while walking sharp left posterior hip pain that radiates into the thigh down towards the knee, worse with walking, and relieved with oxycodone and Robaxin.  He has an appointment tomorrow with Dr. Alvan Dame for follow-up after his replacement, but he was concerned that something had gone wrong with his hip replacement so he came today for evaluation.  He denies any erythema or warmth by the incision, fevers, chills, CP, SOB, abd pain, N/V/D/C, hematuria, dysuria, numbness, tingling, focal weakness, or any other complaints at this time.    The history is provided by the patient and medical records. No language interpreter was used.  Hip Pain  This is a new problem. The current episode started more than 2 days ago. The problem occurs daily. The problem has not changed since onset.Pertinent negatives include no chest pain, no abdominal pain and no shortness of breath. The symptoms are aggravated by walking. The symptoms are relieved by narcotics and medications. Treatments tried: oxycodone and robaxin. The treatment provided moderate relief.     Past Medical History:  Diagnosis Date  . Arthritis   . Pre-diabetes    on metformin    Patient Active Problem List   Diagnosis Date Noted  . Overweight (BMI 25.0-29.9) 02/18/2017  . 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 Plantar fasciitis 06/09/2016    Past Surgical History:  Procedure Laterality Date  . BACK SURGERY     c3,c4,c5,c6 ,l4,l5  . EYE SURGERY     bil cataracts  . NASAL SEPTUM SURGERY    . TONSILLECTOMY    . 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   Procedure: LEFT TOTAL HIP ARTHROPLASTY ANTERIOR APPROACH;  Surgeon: Paralee Cancel, MD;  Location: [facility] ORS;  Service: Orthopedics;  Laterality: Left;  70 mins       Home Medications    Prior to Admission medications   Medication Sig Start Date End Date Taking? Authorizing Provider  aspirin (ASPIRIN CHILDRENS) 81 MG chewable tablet Chew 1 tablet (81 mg total) by mouth 2 (two) times daily. Take for 4 weeks, then resume regular dose. 02/17/17 03/19/17  Danae Orleans, PA-C  atorvastatin (LIPITOR) 20 MG tablet Take 20 mg by mouth 2 (two) times a week.  04/18/16   [provider]  Calcium Polycarbophil (FIBER-CAPS PO) Take 1 capsule by mouth 2 (two) times daily.     [provider]  cetirizine (ZYRTEC) 10 MG tablet Take 10 mg by mouth at bedtime.    [provider]  clobetasol cream (TEMOVATE)  9.50 % Apply 1 application topically daily.    [provider]  docusate sodium (COLACE) 100 MG capsule Take 1 capsule (100 mg total) by mouth 2 (two) times daily. 02/17/17   Danae Orleans, PA-C  ferrous sulfate (FERROUSUL) 325 (65 FE) MG tablet Take 1 tablet (325 mg total) by mouth 3 (three) times daily with meals. 02/17/17   Danae Orleans, PA-C  fexofenadine (ALLEGRA) 180 MG tablet Take 180 mg by mouth daily.    [provider]  HYDROcodone-acetaminophen (NORCO) 7.5-325 MG tablet Take 1-2 tablets by mouth every 4 (four) hours as needed for moderate pain or severe pain. 02/17/17   Danae Orleans, PA-C  metFORMIN (GLUCOPHAGE-XR) 500 MG 24 hr tablet Take 500 mg by mouth 2 (two) times daily.  04/18/16   [provider]  methocarbamol (ROBAXIN) 500 MG tablet Take 1 tablet (500 mg total) by mouth every 6 (six) hours as needed for muscle spasms. 02/17/17   Danae Orleans, PA-C  Multiple Vitamin (MULTIVITAMIN) tablet Take 1 tablet by mouth daily.    [provider]  Multiple Vitamins-Minerals (PRESERVISION AREDS 2 PO) Take 1 capsule by mouth 2 (two) times daily.    [provider]  OVER THE COUNTER MEDICATION Take 2 tablets by mouth daily. Active PK Supplement    [provider]  polyethylene glycol (MIRALAX / GLYCOLAX) packet Take 17 g by mouth 2 (two) times daily. 02/17/17   Danae Orleans, PA-C    Family History No family history on file.  Social History Social History   Tobacco Use  . Smoking status: Current Every Day Smoker    Types: Cigarettes  . Smokeless tobacco: Never Used  . Tobacco comment: 8-10 a day  Substance Use Topics  . Alcohol use: Yes    Alcohol/week: 0.6 oz    Types: 1 Glasses of wine per week    Comment: socially  . Drug use: No     Allergies   Iodine   Review of Systems Review of Systems  Constitutional: Negative for chills and fever.  Respiratory: Negative for shortness of breath.   Cardiovascular: Negative for chest pain.  Gastrointestinal: Negative for abdominal pain, constipation, diarrhea, nausea and vomiting.  Genitourinary: Negative for dysuria and hematuria.  Musculoskeletal: Positive for arthralgias. Negative for joint swelling and myalgias.  Skin: Negative for color change.  Allergic/Immunologic: Positive for immunocompromised state (pre-diabetes).  Neurological: Negative for weakness and numbness.  Psychiatric/Behavioral: Negative for confusion.   All other systems reviewed and are negative for acute change except as noted in the HPI.    Physical Exam Updated Vital Signs BP 132/62 (BP  Location: Left Arm)   Pulse 96   Temp 97.8 F (36.6 C) (Oral)   Resp 17   SpO2 100%   Physical Exam  Constitutional: He is oriented to person, place, and time. Vital signs are normal. He appears well-developed and well-nourished.  Non-toxic appearance. No distress.  Afebrile, nontoxic, NAD  HENT:  Head: Normocephalic and atraumatic.  Mouth/Throat: Oropharynx is clear and moist and mucous membranes are normal.  Eyes: Conjunctivae and EOM are normal. Right eye exhibits no discharge. Left eye exhibits no discharge.  Neck: Normal range of motion. Neck supple.  Cardiovascular: Normal rate, regular rhythm, normal heart sounds and intact distal pulses. Exam reveals no gallop and no friction rub.  No murmur heard. Pulmonary/Chest: Effort normal and breath sounds normal. No respiratory distress. He has no decreased breath sounds. He has no wheezes. He has no rhonchi. He has  no rales.  Abdominal: Soft. Normal appearance and bowel sounds are normal. He exhibits no distension. There is no tenderness. There is no rigidity, no rebound, no guarding, no CVA tenderness, no tenderness at McBurney's point and negative Murphy's sign.  Musculoskeletal:       Left hip: He exhibits decreased range of motion (due to pain). He exhibits no tenderness, no bony tenderness, no swelling, no crepitus and no deformity.  L hip with well healing incision over anterolateral hip, no erythema or warmth, hip with slightly diminished ext and int rotation due to pain, with no focal bony or joint line TTP, no bruising or swelling, no crepitus or deformity, no limb length discrepancy or abnormal rotation. No pain with log roll testing. Strength and sensation grossly intact, distal pulses intact, compartments soft.   Neurological: He is alert and oriented to person, place, and time. He has normal strength. No sensory deficit.  Skin: Skin is warm, dry and intact. No rash noted.  Psychiatric: He has a normal mood and affect.  Nursing  note and vitals reviewed.    ED Treatments / Results  Labs (all labs ordered are listed, but only abnormal results are displayed) Labs Reviewed - No data to display  EKG  EKG Interpretation None       Radiology Dg Hip Unilat With Pelvis 2-3 Views Left  Result Date: 03/03/2017 CLINICAL DATA:  Left hip pain x 4 days; pt states that had hip surgery 2 weeks ago, and was doing fine. 4 days ago he was on his knee picking up laundry and left hip started; pain radiates to left buttocks EXAM: DG HIP (WITH OR WITHOUT PELVIS) 2-3V LEFT COMPARISON:  02/17/2017 FINDINGS: PE left hip total arthroplasty is well-seated and well-aligned, unchanged from prior exam. There is no fracture. No bone lesion. No evidence of loosening of the orthopedic hardware. Mild superolateral right hip joint space narrowing stable from the prior study. SI joints and symphysis pubis are normally aligned. Soft tissues are unremarkable. IMPRESSION: 1. No acute findings. 2. Well-positioned left hip total arthroplasty. No evidence prosthetic loosening. Electronically Signed   By: Lajean Manes M.D.   On: 03/03/2017 13:43    Procedures Procedures (including critical care time)  Medications Ordered in ED Medications - No data to display   Initial Impression / Assessment and Plan / ED Course  I have reviewed the triage vital signs and the nursing notes.  Pertinent labs & imaging results that were available during my care of the patient were reviewed by me and considered in my medical decision making (see chart for details).     76 y.o. male here with L hip pain after feeling a pop in it when he got up from kneeling 5 days ago. Had been doing well, not needing his walker or cane, but since the incident he has needed a cane to ambulate. On exam, no area of tenderness to palpation, incision looks great without evidence of infection, pain with ext rotation and int rotation, extremity NVI with soft compartments, no spinal  tenderness. Will obtain xray; pt declines wanting pain meds. Discussed case with my attending Dr. Ralene Bathe who agrees with plan. Will reassess shortly.   2:15 PM Xray negative for acute finding. Could be capsular tear/strain pain vs muscular tear/strain. Advised use of home pain meds and muscle relaxants, ice/heat use, and f/up with his orthopedist tomorrow for his already scheduled visit, and for recheck of his symptoms. I explained the diagnosis and have given explicit precautions  to return to the ER including for any other new or worsening symptoms. The patient understands and accepts the medical plan as it's been dictated and I have answered their questions. Discharge instructions concerning home care and prescriptions have been given. The patient is STABLE and is discharged to home in good condition.    Final Clinical Impressions(s) / ED Diagnoses   Final diagnoses:  Left hip pain  Status post left hip replacement    ED Discharge Orders    [address] [place], [place] 03/03/17 1415    Quintella Reichert, MD 03/04/17 [phone]

## 2017-03-03 NOTE — ED Triage Notes (Signed)
Patient reports that he had left hip surgery 2 weeks ago and 4 days ago he was on his knees getting laundry out of dryer and when he went to stand either felt/heard pop and continued to have pain with movement since.

## 2017-03-03 NOTE — Discharge Instructions (Signed)
Take your home pain medications and muscle relaxants as needed for pain. Do not drive or operate machinery with muscle relaxant or narcotic use. Use ice and heat to areas of soreness, no more than 20 minutes at a time every hour for each. Follow up with your orthopedist tomorrow for your already scheduled visit, and to recheck symptoms. Return to ER for emergent changing or worsening of symptoms.

## 2017-03-24 DIAGNOSIS — L309 Dermatitis, unspecified: Secondary | ICD-10-CM | POA: Diagnosis not present

## 2017-03-24 DIAGNOSIS — Z1211 Encounter for screening for malignant neoplasm of colon: Secondary | ICD-10-CM | POA: Diagnosis not present

## 2017-03-24 DIAGNOSIS — E559 Vitamin D deficiency, unspecified: Secondary | ICD-10-CM | POA: Diagnosis not present

## 2017-03-24 DIAGNOSIS — Z0000 Encounter for general adult medical examination without abnormal findings: Secondary | ICD-10-CM | POA: Diagnosis not present

## 2017-03-24 DIAGNOSIS — E782 Mixed hyperlipidemia: Secondary | ICD-10-CM | POA: Diagnosis not present

## 2017-03-24 DIAGNOSIS — E119 Type 2 diabetes mellitus without complications: Secondary | ICD-10-CM | POA: Diagnosis not present

## 2017-03-24 DIAGNOSIS — Z7984 Long term (current) use of oral hypoglycemic drugs: Secondary | ICD-10-CM | POA: Diagnosis not present

## 2017-03-24 DIAGNOSIS — Z1389 Encounter for screening for other disorder: Secondary | ICD-10-CM | POA: Diagnosis not present

## 2017-03-24 DIAGNOSIS — J309 Allergic rhinitis, unspecified: Secondary | ICD-10-CM | POA: Diagnosis not present

## 2017-03-24 DIAGNOSIS — M85852 Other specified disorders of bone density and structure, left thigh: Secondary | ICD-10-CM | POA: Diagnosis not present

## 2017-03-24 DIAGNOSIS — R030 Elevated blood-pressure reading, without diagnosis of hypertension: Secondary | ICD-10-CM | POA: Diagnosis not present

## 2017-03-25 DIAGNOSIS — Z1211 Encounter for screening for malignant neoplasm of colon: Secondary | ICD-10-CM | POA: Diagnosis not present

## 2017-04-01 DIAGNOSIS — Z471 Aftercare following joint replacement surgery: Secondary | ICD-10-CM | POA: Diagnosis not present

## 2017-04-01 DIAGNOSIS — Z96642 Presence of left artificial hip joint: Secondary | ICD-10-CM | POA: Diagnosis not present

## 2017-04-09 DIAGNOSIS — E119 Type 2 diabetes mellitus without complications: Secondary | ICD-10-CM | POA: Diagnosis not present

## 2017-04-09 DIAGNOSIS — Z961 Presence of intraocular lens: Secondary | ICD-10-CM | POA: Diagnosis not present

## 2017-04-09 DIAGNOSIS — H524 Presbyopia: Secondary | ICD-10-CM | POA: Diagnosis not present

## 2017-04-09 DIAGNOSIS — H35371 Puckering of macula, right eye: Secondary | ICD-10-CM | POA: Diagnosis not present

## 2017-04-09 DIAGNOSIS — H5211 Myopia, right eye: Secondary | ICD-10-CM | POA: Diagnosis not present

## 2017-05-18 DIAGNOSIS — M8588 Other specified disorders of bone density and structure, other site: Secondary | ICD-10-CM | POA: Diagnosis not present

## 2017-06-07 ENCOUNTER — Inpatient Hospital Stay (HOSPITAL_COMMUNITY)
Admission: EM | Admit: 2017-06-07 | Discharge: 2017-06-08 | DRG: 065 | Disposition: A | Discharge status: Home / Self Care | Payer: PPO | Attending: Internal Medicine | Admitting: Internal Medicine

## 2017-06-07 ENCOUNTER — Encounter (HOSPITAL_COMMUNITY): Payer: Self-pay | Admitting: Emergency Medicine

## 2017-06-07 ENCOUNTER — Inpatient Hospital Stay (HOSPITAL_COMMUNITY): Payer: PPO

## 2017-06-07 ENCOUNTER — Emergency Department (HOSPITAL_COMMUNITY): Payer: PPO

## 2017-06-07 DIAGNOSIS — Z79899 Other long term (current) drug therapy: Secondary | ICD-10-CM | POA: Diagnosis not present

## 2017-06-07 DIAGNOSIS — G8191 Hemiplegia, unspecified affecting right dominant side: Secondary | ICD-10-CM | POA: Diagnosis not present

## 2017-06-07 DIAGNOSIS — I6300 Cerebral infarction due to thrombosis of unspecified precerebral artery: Secondary | ICD-10-CM | POA: Diagnosis not present

## 2017-06-07 DIAGNOSIS — Z7982 Long term (current) use of aspirin: Secondary | ICD-10-CM

## 2017-06-07 DIAGNOSIS — R531 Weakness: Secondary | ICD-10-CM | POA: Diagnosis not present

## 2017-06-07 DIAGNOSIS — I639 Cerebral infarction, unspecified: Secondary | ICD-10-CM | POA: Diagnosis not present

## 2017-06-07 DIAGNOSIS — F17210 Nicotine dependence, cigarettes, uncomplicated: Secondary | ICD-10-CM | POA: Diagnosis not present

## 2017-06-07 DIAGNOSIS — I5030 Unspecified diastolic (congestive) heart failure: Secondary | ICD-10-CM | POA: Diagnosis not present

## 2017-06-07 DIAGNOSIS — I6789 Other cerebrovascular disease: Secondary | ICD-10-CM | POA: Diagnosis present

## 2017-06-07 DIAGNOSIS — E1151 Type 2 diabetes mellitus with diabetic peripheral angiopathy without gangrene: Secondary | ICD-10-CM | POA: Diagnosis present

## 2017-06-07 DIAGNOSIS — R29702 NIHSS score 2: Secondary | ICD-10-CM | POA: Diagnosis not present

## 2017-06-07 DIAGNOSIS — R220 Localized swelling, mass and lump, head: Secondary | ICD-10-CM | POA: Diagnosis not present

## 2017-06-07 DIAGNOSIS — Z7984 Long term (current) use of oral hypoglycemic drugs: Secondary | ICD-10-CM | POA: Diagnosis not present

## 2017-06-07 DIAGNOSIS — Z91041 Radiographic dye allergy status: Secondary | ICD-10-CM | POA: Diagnosis not present

## 2017-06-07 DIAGNOSIS — I69398 Other sequelae of cerebral infarction: Secondary | ICD-10-CM

## 2017-06-07 DIAGNOSIS — E785 Hyperlipidemia, unspecified: Secondary | ICD-10-CM | POA: Diagnosis present

## 2017-06-07 DIAGNOSIS — M1990 Unspecified osteoarthritis, unspecified site: Secondary | ICD-10-CM | POA: Diagnosis present

## 2017-06-07 DIAGNOSIS — Z96642 Presence of left artificial hip joint: Secondary | ICD-10-CM | POA: Diagnosis not present

## 2017-06-07 LAB — CBC
HEMATOCRIT: 43 % (ref 39.0–52.0)
Hemoglobin: 14.8 g/dL (ref 13.0–17.0)
MCH: 30.8 pg (ref 26.0–34.0)
MCHC: 34.4 g/dL (ref 30.0–36.0)
MCV: 89.6 fL (ref 78.0–100.0)
Platelets: 297 10*3/uL (ref 150–400)
RBC: 4.8 MIL/uL (ref 4.22–5.81)
RDW: 15.2 % (ref 11.5–15.5)
WBC: 7.3 10*3/uL (ref 4.0–10.5)

## 2017-06-07 LAB — DIFFERENTIAL
Basophils Absolute: 0 10*3/uL (ref 0.0–0.1)
Basophils Relative: 0 %
Eosinophils Absolute: 0.3 10*3/uL (ref 0.0–0.7)
Eosinophils Relative: 5 %
LYMPHS PCT: 21 %
Lymphs Abs: 1.5 10*3/uL (ref 0.7–4.0)
MONO ABS: 0.7 10*3/uL (ref 0.1–1.0)
Monocytes Relative: 10 %
NEUTROS ABS: 4.7 10*3/uL (ref 1.7–7.7)
Neutrophils Relative %: 64 %

## 2017-06-07 LAB — I-STAT CHEM 8, ED
BUN: 21 mg/dL — AB (ref 6–20)
CALCIUM ION: 1.18 mmol/L (ref 1.15–1.40)
CHLORIDE: 103 mmol/L (ref 101–111)
Creatinine, Ser: 0.8 mg/dL (ref 0.61–1.24)
GLUCOSE: 149 mg/dL — AB (ref 65–99)
HCT: 44 % (ref 39.0–52.0)
Hemoglobin: 15 g/dL (ref 13.0–17.0)
Potassium: 4.1 mmol/L (ref 3.5–5.1)
Sodium: 139 mmol/L (ref 135–145)
TCO2: 24 mmol/L (ref 22–32)

## 2017-06-07 LAB — URINALYSIS, ROUTINE W REFLEX MICROSCOPIC
BILIRUBIN URINE: NEGATIVE
Glucose, UA: NEGATIVE mg/dL
Hgb urine dipstick: NEGATIVE
Ketones, ur: NEGATIVE mg/dL
Leukocytes, UA: NEGATIVE
NITRITE: NEGATIVE
Protein, ur: NEGATIVE mg/dL
SPECIFIC GRAVITY, URINE: 1.01 (ref 1.005–1.030)
pH: 7 (ref 5.0–8.0)

## 2017-06-07 LAB — ETHANOL

## 2017-06-07 LAB — RAPID URINE DRUG SCREEN, HOSP PERFORMED
AMPHETAMINES: NOT DETECTED
Barbiturates: NOT DETECTED
Benzodiazepines: NOT DETECTED
Cocaine: NOT DETECTED
Opiates: NOT DETECTED
Tetrahydrocannabinol: NOT DETECTED

## 2017-06-07 LAB — COMPREHENSIVE METABOLIC PANEL
ALT: 18 U/L (ref 17–63)
AST: 18 U/L (ref 15–41)
Albumin: 3.4 g/dL — ABNORMAL LOW (ref 3.5–5.0)
Alkaline Phosphatase: 66 U/L (ref 38–126)
Anion gap: 9 (ref 5–15)
BILIRUBIN TOTAL: 0.4 mg/dL (ref 0.3–1.2)
BUN: 22 mg/dL — ABNORMAL HIGH (ref 6–20)
CALCIUM: 9.2 mg/dL (ref 8.9–10.3)
CO2: 23 mmol/L (ref 22–32)
CREATININE: 0.87 mg/dL (ref 0.61–1.24)
Chloride: 105 mmol/L (ref 101–111)
Glucose, Bld: 152 mg/dL — ABNORMAL HIGH (ref 65–99)
Potassium: 4.2 mmol/L (ref 3.5–5.1)
Sodium: 137 mmol/L (ref 135–145)
Total Protein: 6.3 g/dL — ABNORMAL LOW (ref 6.5–8.1)

## 2017-06-07 LAB — I-STAT TROPONIN, ED: Troponin i, poc: 0 ng/mL (ref 0.00–0.08)

## 2017-06-07 LAB — APTT: aPTT: 28 seconds (ref 24–36)

## 2017-06-07 LAB — GLUCOSE, CAPILLARY: Glucose-Capillary: 124 mg/dL — ABNORMAL HIGH (ref 65–99)

## 2017-06-07 LAB — PROTIME-INR
INR: 0.9
Prothrombin Time: 12 seconds (ref 11.4–15.2)

## 2017-06-07 LAB — CBG MONITORING, ED: GLUCOSE-CAPILLARY: 114 mg/dL — AB (ref 65–99)

## 2017-06-07 LAB — ECHOCARDIOGRAM COMPLETE

## 2017-06-07 MED ORDER — ACETAMINOPHEN 160 MG/5ML PO SOLN
650.0000 mg | ORAL | Status: DC | PRN
Start: 2017-06-07 — End: 2017-06-08

## 2017-06-07 MED ORDER — INSULIN ASPART 100 UNIT/ML ~~LOC~~ SOLN: 0.0000 [IU] | Freq: Three times a day (TID) | SUBCUTANEOUS | Status: DC

## 2017-06-07 MED ORDER — ASPIRIN 81 MG PO CHEW
324.0000 mg | CHEWABLE_TABLET | Freq: Once | ORAL | Status: AC
  Administered 2017-06-07: 324 mg via ORAL
  Filled 2017-06-07: qty 4

## 2017-06-07 MED ORDER — ACETAMINOPHEN 650 MG RE SUPP
650.0000 mg | RECTAL | Status: DC | PRN
Start: 2017-06-07 — End: 2017-06-08

## 2017-06-07 MED ORDER — ASPIRIN 325 MG PO TABS: 325.0000 mg | ORAL_TABLET | Freq: Every day | ORAL | Status: DC

## 2017-06-07 MED ORDER — ATORVASTATIN CALCIUM 40 MG PO TABS
40.0000 mg | ORAL_TABLET | Freq: Every day | ORAL | Status: DC
  Filled 2017-06-07: qty 1

## 2017-06-07 MED ORDER — STROKE: EARLY STAGES OF RECOVERY BOOK
Freq: Once | Status: DC
  Filled 2017-06-07: qty 1

## 2017-06-07 MED ORDER — SODIUM CHLORIDE 0.9 % IV SOLN
INTRAVENOUS | Status: DC
  Administered 2017-06-07: 17:00:00 via INTRAVENOUS
  Administered 2017-06-08: 1000 mL via INTRAVENOUS

## 2017-06-07 MED ORDER — ENOXAPARIN SODIUM 40 MG/0.4ML ~~LOC~~ SOLN
40.0000 mg | SUBCUTANEOUS | Status: DC
  Administered 2017-06-07: 40 mg via SUBCUTANEOUS
  Filled 2017-06-07: qty 0.4

## 2017-06-07 MED ORDER — ACETAMINOPHEN 325 MG PO TABS: 650.0000 mg | ORAL_TABLET | ORAL | Status: DC | PRN

## 2017-06-07 NOTE — ED Notes (Signed)
ED TO INPATIENT HANDOFF REPORT  Name/Age/Gender Timothy Brewer 77 y.o. male  Code Status    Code Status Orders  (From admission, onward)        Start     Ordered   06/07/17 1255  Full code  Continuous     06/07/17 1258    Code Status History    Date Active Date Inactive Code Status Order ID Comments User Context   02/17/2017 1249 02/18/2017 1407 Full Code [id number]  Norman Herrlich Inpatient      Home/SNF/Other Home  Chief Complaint right side pain   Level of Care/Admitting Diagnosis ED Disposition    ED Disposition Condition [place]: [facility] [[id number]]  Level of Care: Telemetry [5]  Diagnosis: CVA (cerebral vascular accident) [facility]) [id number]  Admitting Physician: Georgette Shell [[id number]]  Attending Physician: Georgette Shell [[id number]]  Estimated length of stay: 3 - 4 days  Certification:: I certify this patient will need inpatient services for at least 2 midnights  PT Class (Do Not Modify): Inpatient [101]  PT Acc Code (Do Not Modify): Private [1]       Medical History Past Medical History:  Diagnosis Date  . Arthritis   . Pre-diabetes    on metformin    Allergies Allergies  Allergen Reactions  . Iodine Hives and Other (See Comments)    Internal only    IV Location/Drains/Wounds Patient Lines/Drains/Airways Status   Active Line/Drains/Airways    Name:   Placement date:   Placement time:   Site:   Days:   Peripheral IV 06/07/17 Left Forearm   06/07/17    0949    Forearm   less than 1   Incision (Closed) 02/17/17 Hip Left   02/17/17    1032     110          Labs/Imaging Results for orders placed or performed during the hospital encounter of 06/07/17 (from the past 48 hour(s))  Ethanol     Status: None   Collection Time: 06/07/17  9:38 AM  Result Value Ref Range   Alcohol, Ethyl (B) <10 <10 mg/dL    Comment:        LOWEST DETECTABLE LIMIT FOR SERUM ALCOHOL IS 10 mg/dL FOR MEDICAL  PURPOSES ONLY Performed at [facility], [place] [address] [place], [place] [address]   Protime-INR     Status: None   Collection Time: 06/07/17  9:38 AM  Result Value Ref Range   Prothrombin Time 12.0 11.4 - 15.2 seconds   INR 0.90     Comment: Performed at [facility], [place] [address] [place], [place] [address]  APTT     Status: None   Collection Time: 06/07/17  9:38 AM  Result Value Ref Range   aPTT 28 24 - 36 seconds    Comment: Performed at [facility], [place] [address] [place], [place] [address]  CBC     Status: None   Collection Time: 06/07/17  9:38 AM  Result Value Ref Range   WBC 7.3 4.0 - 10.5 K/uL   RBC 4.80 4.22 - 5.81 MIL/uL   Hemoglobin 14.8 13.0 - 17.0 g/dL   HCT 43.0 39.0 - 52.0 %   MCV 89.6 78.0 - 100.0 fL   MCH 30.8 26.0 - 34.0 pg   MCHC 34.4 30.0 - 36.0 g/dL   RDW 15.2 11.5 - 15.5 %   Platelets 297 150 - 400  K/uL    Comment: Performed at [facility], [place] [address] [place], [place] [address]  Differential     Status: None   Collection Time: 06/07/17  9:38 AM  Result Value Ref Range   Neutrophils Relative % 64 %   Neutro Abs 4.7 1.7 - 7.7 K/uL   Lymphocytes Relative 21 %   Lymphs Abs 1.5 0.7 - 4.0 K/uL   Monocytes Relative 10 %   Monocytes Absolute 0.7 0.1 - 1.0 K/uL   Eosinophils Relative 5 %   Eosinophils Absolute 0.3 0.0 - 0.7 K/uL   Basophils Relative 0 %   Basophils Absolute 0.0 0.0 - 0.1 K/uL    Comment: Performed at [facility], [place] [address], [place], [place] [address]  Comprehensive metabolic panel     Status: Abnormal   Collection Time: 06/07/17  9:38 AM  Result Value Ref Range   Sodium 137 135 - 145 mmol/L   Potassium 4.2 3.5 - 5.1 mmol/L   Chloride 105 101 - 111 mmol/L   CO2 23 22 - 32 mmol/L   Glucose, Bld 152 (H) 65 - 99 mg/dL   BUN 22 (H) 6 - 20 mg/dL   Creatinine, Ser 0.87 0.61 - 1.24 mg/dL   Calcium 9.2 8.9 - 10.3 mg/dL   Total  Protein 6.3 (L) 6.5 - 8.1 g/dL   Albumin 3.4 (L) 3.5 - 5.0 g/dL   AST 18 15 - 41 U/L   ALT 18 17 - 63 U/L   Alkaline Phosphatase 66 38 - 126 U/L   Total Bilirubin 0.4 0.3 - 1.2 mg/dL   GFR calc non Af Amer >60 >60 mL/min   GFR calc Af Amer >60 >60 mL/min    Comment: (NOTE) The eGFR has been calculated using the CKD EPI equation. This calculation has not been validated in all clinical situations. eGFR's persistently <60 mL/min signify possible Chronic Kidney Disease.    Anion gap 9 5 - 15    Comment: Performed at [facility], [place] [address], [place],  [address]  I-stat troponin, ED     Status: None   Collection Time: 06/07/17  9:44 AM  Result Value Ref Range   Troponin i, poc 0.00 0.00 - 0.08 ng/mL   Comment 3            Comment: Due to the release kinetics of cTnI, a negative result within the first hours of the onset of symptoms does not rule out myocardial infarction with certainty. If myocardial infarction is still suspected, repeat the test at appropriate intervals.   I-Stat Chem 8, ED     Status: Abnormal   Collection Time: 06/07/17  9:46 AM  Result Value Ref Range   Sodium 139 135 - 145 mmol/L   Potassium 4.1 3.5 - 5.1 mmol/L   Chloride 103 101 - 111 mmol/L   BUN 21 (H) 6 - 20 mg/dL   Creatinine, Ser 0.80 0.61 - 1.24 mg/dL   Glucose, Bld 149 (H) 65 - 99 mg/dL   Calcium, Ion 1.18 1.15 - 1.40 mmol/L   TCO2 24 22 - 32 mmol/L   Hemoglobin 15.0 13.0 - 17.0 g/dL   HCT 44.0 39.0 - 52.0 %  Urine rapid drug screen (hosp performed)     Status: None   Collection Time: 06/07/17 11:37 AM  Result Value Ref Range   Opiates NONE DETECTED NONE DETECTED   Cocaine NONE DETECTED NONE DETECTED   Benzodiazepines NONE DETECTED NONE DETECTED  Amphetamines NONE DETECTED NONE DETECTED   Tetrahydrocannabinol NONE DETECTED NONE DETECTED   Barbiturates NONE DETECTED NONE DETECTED    Comment: (NOTE) DRUG SCREEN FOR MEDICAL PURPOSES ONLY.  IF CONFIRMATION IS  NEEDED FOR ANY PURPOSE, NOTIFY LAB WITHIN 5 DAYS. LOWEST DETECTABLE LIMITS FOR URINE DRUG SCREEN Drug Class                     Cutoff (ng/mL) Amphetamine and metabolites    1000 Barbiturate and metabolites    200 Benzodiazepine                 675 Tricyclics and metabolites     300 Opiates and metabolites        300 Cocaine and metabolites        300 THC                            50 Performed at [facility], [place] [address], [place], [place] [address]   Urinalysis, Routine w reflex microscopic     Status: None   Collection Time: 06/07/17 11:37 AM  Result Value Ref Range   Color, Urine YELLOW YELLOW   APPearance CLEAR CLEAR   Specific Gravity, Urine 1.010 1.005 - 1.030   pH 7.0 5.0 - 8.0   Glucose, UA NEGATIVE NEGATIVE mg/dL   Hgb urine dipstick NEGATIVE NEGATIVE   Bilirubin Urine NEGATIVE NEGATIVE   Ketones, ur NEGATIVE NEGATIVE mg/dL   Protein, ur NEGATIVE NEGATIVE mg/dL   Nitrite NEGATIVE NEGATIVE   Leukocytes, UA NEGATIVE NEGATIVE    Comment: Performed at [place] [address] [place],  [address]  CBG monitoring, ED     Status: Abnormal   Collection Time: 06/07/17  5:11 PM  Result Value Ref Range   Glucose-Capillary 114 (H) 65 - 99 mg/dL   Ct Head Wo Contrast  Result Date: 06/07/2017 CLINICAL DATA:  Right-sided weakness. EXAM: CT HEAD WITHOUT CONTRAST TECHNIQUE: Contiguous axial images were obtained from the base of the skull through the vertex without intravenous contrast. COMPARISON:  None FINDINGS: Brain: No evidence of acute infarction, hemorrhage, hydrocephalus, extra-axial collection or mass lesion/mass effect. There is mild diffuse low-attenuation within the subcortical and periventricular white matter compatible with chronic microvascular disease. Vascular: No hyperdense vessel or unexpected calcification. Skull: Normal. Negative for fracture or focal lesion. Sinuses/Orbits: Bubbly opacity within the left  maxillary sinus noted. The mastoid air cells are clear. Other: None. IMPRESSION: 1. No acute intracranial abnormalities. 2. Small vessel ischemic change 3. Left maxillary sinus opacification. Electronically Signed   By: Kerby Moors M.D.   On: 06/07/2017 09:44    Pending Labs Unresulted Labs (From admission, onward)   Start     Ordered   06/08/17 0500  Hemoglobin A1c  Tomorrow morning,   R     06/07/17 1258   06/08/17 0500  Lipid panel  Tomorrow morning,   R    Comments:  Fasting    06/07/17 1258      Vitals/Pain Today's Vitals   06/07/17 1546 06/07/17 1642 06/07/17 1736 06/07/17 1815  BP: 130/86 (!) 143/74 (!) 151/74 (!) 159/81  Pulse: 77 82 78 80  Resp: (!) 22 12 (!) 21 19  Temp:      TempSrc:      SpO2: 91% 96% 95% 92%  PainSc:        Isolation Precautions No active isolations  Medications Medications  stroke: mapping our early stages of recovery book (has no administration in time range)  0.9 %  sodium chloride infusion ( Intravenous New Bag/Given 06/07/17 1718)  acetaminophen (TYLENOL) tablet 650 mg (has no administration in time range)    Or  acetaminophen (TYLENOL) solution 650 mg (has no administration in time range)    Or  acetaminophen (TYLENOL) suppository 650 mg (has no administration in time range)  enoxaparin (LOVENOX) injection 40 mg (has no administration in time range)  aspirin tablet 325 mg (325 mg Oral Not Given 06/07/17 1719)  atorvastatin (LIPITOR) tablet 40 mg (has no administration in time range)  insulin aspart (novoLOG) injection 0-9 Units (0 Units Subcutaneous Not Given 06/07/17 1718)  aspirin chewable tablet 324 mg (324 mg Oral Given 06/07/17 1148)    Mobility walks

## 2017-06-07 NOTE — ED Triage Notes (Signed)
Patient here with complaints of stroke like symptoms. Reports that on Thursday he was keeping score at a game and "forgot how to write". Also reports that he is having "difficulty with his right side", states that he cannot pick up right leg "it drags". Denies slurred speech, difficulty eating or drinking.

## 2017-06-07 NOTE — H&P (Signed)
History and Physical    Timothy Brewer TSV:[id number] DOB: Sep 23, 1940 DOA: 06/07/2017  PCP: Antony Contras, MD Patient coming from: Home patient lives alone at home.  Chief Complaint forgot how to write  HPI: Timothy Brewer is a 77 y.o. 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  He reported that he was keeping his core at the game and forgot her to write.  He thought it will go away but then he also had right-sided weakness and started dragging his right leg.  Since the symptoms did not go away he decided to come to the ER.  He denies any slurred speech difficulty eating drinking or swallowing.  He still has difficulty writing and weakness to his right upper and lower extremity.  He denies any headache nausea vomiting visual changes.  He has never had a stroke in the past.  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  Prior to the surgery he had an episode of nausea vomiting and dizziness which has been resolved.  When I saw him in the ER patient was awake alert oriented x3 answer all questions appropriately.  Able to follow commands.  Moving all extremities .  He reported that he was hungry and he wanted to eat.  Denies shortness of breath chest pain palpitations abdominal pain or urinary complaints.  ED Course: Sodium 139 potassium 4.1 BUN 21 creatinine 0.80 liver function test is normal, WBC 7.3 hemoglobin 14.8 platelet count 297.  INR 0.90 glucose 149 UA negative urine drug screen negative alcohol level less than 10.  EKG normal sinus rhythm no acute changes.  CT scan of the head shows no acute intracranial abnormality small vessel ischemic change and left maxillary sinus opacification.  Review of Systems: As per HPI otherwise all other systems reviewed  and are negative  Past Medical History:  Diagnosis Date  . Arthritis   . Pre-diabetes    on metformin    Past Surgical History:  Procedure Laterality Date  . BACK SURGERY     c3,c4,c5,c6 ,l4,l5  . EYE SURGERY     bil cataracts  . NASAL SEPTUM SURGERY    . TONSILLECTOMY    . 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   Procedure: LEFT TOTAL HIP ARTHROPLASTY ANTERIOR APPROACH;  Surgeon: Paralee Cancel, MD;  Location: [facility];  Service: Orthopedics;  Laterality: Left;  70 mins    Social History   Socioeconomic History  . Marital status: Widowed    Spouse name: Not on file  . Number of children: Not on file  . Years of education: Not on file  . Highest education level: Not on file  Occupational History  . Not on file  Social Needs  . Financial resource strain: Not on file  . Food insecurity:    Worry: Not on file    Inability: Not on file  . Transportation needs:    Medical: Not on file    Non-medical: Not on file  Tobacco Use  . Smoking status: Current Every Day Smoker    Types: Cigarettes  . Smokeless tobacco: Never Used  . Tobacco comment: 8-10 a day  Substance and Sexual Activity  . Alcohol use: Yes    Alcohol/week: 0.6 oz    Types: 1 Glasses of wine per week  Comment: socially  . Drug use: No  . Sexual activity: Yes  Lifestyle  . Physical activity:    Days per week: Not on file    Minutes per session: Not on file  . Stress: Not on file  Relationships  . Social connections:    Talks on phone: Not on file    Gets together: Not on file    Attends religious service: Not on file    Active member of club or organization: Not on file    Attends meetings of clubs or organizations: Not on file    Relationship status: Not on file  . Intimate partner violence:    Fear of current or ex partner: Not on file    Emotionally abused: Not on file    Physically abused: Not on file    Forced sexual activity: Not on file  Other Topics Concern  . Not on file  Social  History Narrative  . Not on file    Allergies  Allergen Reactions  . Iodine Hives and Other (See Comments)    Internal only    No family history on file.    Prior to Admission medications   Medication Sig Start Date End Date Taking? Authorizing Provider  aspirin EC 81 MG tablet Take 81 mg by mouth at bedtime.   Yes [provider]  atorvastatin (LIPITOR) 20 MG tablet Take 20 mg by mouth 2 (two) times a week.  04/18/16  Yes [provider]  calcium carbonate (OS-CAL - DOSED IN MG OF ELEMENTAL CALCIUM) 1250 (500 Ca) MG tablet Take 1 tablet by mouth at bedtime.   Yes [provider]  Calcium Polycarbophil (FIBER-CAPS PO) Take 1 capsule by mouth 2 (two) times daily.    Yes [provider]  cetirizine (ZYRTEC) 10 MG tablet Take 10 mg by mouth at bedtime.   Yes [provider]  cholecalciferol (VITAMIN D) 1000 units tablet Take 1,000 Units by mouth at bedtime.   Yes [provider]  clobetasol cream (TEMOVATE) 0.16 % Apply 1 application topically daily.   Yes [provider]  fexofenadine (ALLEGRA) 180 MG tablet Take 180 mg by mouth daily.   Yes [provider]  metFORMIN (GLUCOPHAGE-XR) 500 MG 24 hr tablet Take 500 mg by mouth 2 (two) times daily.  04/18/16  Yes [provider]  Multiple Vitamin (MULTIVITAMIN) tablet Take 1 tablet by mouth daily.   Yes [provider]  Multiple Vitamins-Minerals (PRESERVISION AREDS 2 PO) Take 1 capsule by mouth 2 (two) times daily.   Yes [provider]  polyethylene glycol (MIRALAX / GLYCOLAX) packet Take 17 g by mouth 2 (two) times daily. Patient taking differently: Take 17 g by mouth daily as needed for mild constipation.  02/17/17  Yes Babish, Rodman Key, PA-C  docusate sodium (COLACE) 100 MG capsule Take 1 capsule (100 mg total) by mouth 2 (two) times daily. Patient not taking: Reported on 06/07/2017 02/17/17   Danae Orleans, PA-C  ferrous sulfate (FERROUSUL) 325  (65 FE) MG tablet Take 1 tablet (325 mg total) by mouth 3 (three) times daily with meals. Patient not taking: Reported on 06/07/2017 02/17/17   Danae Orleans, PA-C  HYDROcodone-acetaminophen (NORCO) 7.5-325 MG tablet Take 1-2 tablets by mouth every 4 (four) hours as needed for moderate pain or severe pain. Patient not taking: Reported on 06/07/2017 02/17/17   Danae Orleans, PA-C  methocarbamol (ROBAXIN) 500 MG tablet Take 1 tablet (500 mg total) by mouth every 6 (six) hours as  needed for muscle spasms. Patient not taking: Reported on 06/07/2017 02/17/17   Danae Orleans, PA-C    Physical Exam: Vitals:   06/07/17 0835 06/07/17 1130 06/07/17 1134 06/07/17 1212  BP: (!) 158/80 (!) 162/81 (!) 162/81 (!) 148/87  Pulse: (!) 107 84 91 79  Resp: 18 15 18 19   Temp: 98 F (36.7 C)     TempSrc: Oral     SpO2: 98% 93% 95% 96%     General:Appears calm and comfortable Eyes: PERRL, EOMI, normal lids, iris ENT:  grossly normal hearing, lips & tongue, mmm Neck:  no LAD, masses or thyromegaly Cardiovascular:  RRR, no m/r/g. No LE edema.  Respiratory: CTA bilaterally, no w/r/r. Normal respiratory effort. Abdomen:  soft, ntnd, NABS Skin:  no rash or induration seen on limited exam Musculoskeletal: grossly normal tone BUE/BLE, good ROM, no bony abnormality Psychiatric: grossly normal mood and affect, speech fluent and appropriate, AOx3 Neurologic: He is awake alert and oriented x3.  He moves all his extremities.  His strength is 4 x 5 in the right upper and lower extremities compared to his left side.    Labs on Admission: I have personally reviewed following labs and imaging studies  CBC: Recent Labs  Lab 06/07/17 0938 06/07/17 0946  WBC 7.3  --   NEUTROABS 4.7  --   HGB 14.8 15.0  HCT 43.0 44.0  MCV 89.6  --   PLT 297  --    Basic Metabolic Panel: Recent Labs  Lab 06/07/17 0938 06/07/17 0946  NA 137 139  K 4.2 4.1  CL 105 103  CO2 23  --   GLUCOSE 152* 149*  BUN 22* 21*    CREATININE 0.87 0.80  CALCIUM 9.2  --    GFR: CrCl cannot be calculated (Unknown ideal weight.). Liver Function Tests: Recent Labs  Lab 06/07/17 0938  AST 18  ALT 18  ALKPHOS 66  BILITOT 0.4  PROT 6.3*  ALBUMIN 3.4*   No results for input(s): LIPASE, AMYLASE in the last 168 hours. No results for input(s): AMMONIA in the last 168 hours. Coagulation Profile: Recent Labs  Lab 06/07/17 0938  INR 0.90   Cardiac Enzymes: No results for input(s): CKTOTAL, CKMB, CKMBINDEX, TROPONINI in the last 168 hours. BNP (last 3 results) No results for input(s): PROBNP in the last 8760 hours. HbA1C: No results for input(s): HGBA1C in the last 72 hours. CBG: No results for input(s): GLUCAP in the last 168 hours. Lipid Profile: No results for input(s): CHOL, HDL, LDLCALC, TRIG, CHOLHDL, LDLDIRECT in the last 72 hours. Thyroid Function Tests: No results for input(s): TSH, T4TOTAL, FREET4, T3FREE, THYROIDAB in the last 72 hours. Anemia Panel: No results for input(s): VITAMINB12, FOLATE, FERRITIN, TIBC, IRON, RETICCTPCT in the last 72 hours. Urine analysis:    Component Value Date/Time   COLORURINE YELLOW 06/07/2017 1137   APPEARANCEUR CLEAR 06/07/2017 1137   LABSPEC 1.010 06/07/2017 1137   PHURINE 7.0 06/07/2017 1137   GLUCOSEU NEGATIVE 06/07/2017 1137   HGBUR NEGATIVE 06/07/2017 1137   BILIRUBINUR NEGATIVE 06/07/2017 1137   KETONESUR NEGATIVE 06/07/2017 1137   PROTEINUR NEGATIVE 06/07/2017 1137   NITRITE NEGATIVE 06/07/2017 1137   LEUKOCYTESUR NEGATIVE 06/07/2017 1137    Creatinine Clearance: CrCl cannot be calculated (Unknown ideal weight.).  Sepsis Labs: @LABRCNTIP (procalcitonin:4,lacticidven:4) )No results found for this or any previous visit (from the past 240 hour(s)).   Radiological Exams on Admission: Ct Head Wo Contrast  Result Date: 06/07/2017 CLINICAL DATA:  Right-sided weakness. EXAM: CT  HEAD WITHOUT CONTRAST TECHNIQUE: Contiguous axial images were obtained from  the base of the skull through the vertex without intravenous contrast. COMPARISON:  None FINDINGS: Brain: No evidence of acute infarction, hemorrhage, hydrocephalus, extra-axial collection or mass lesion/mass effect. There is mild diffuse low-attenuation within the subcortical and periventricular white matter compatible with chronic microvascular disease. Vascular: No hyperdense vessel or unexpected calcification. Skull: Normal. Negative for fracture or focal lesion. Sinuses/Orbits: Bubbly opacity within the left maxillary sinus noted. The mastoid air cells are clear. Other: None. IMPRESSION: 1. No acute intracranial abnormalities. 2. Small vessel ischemic change 3. Left maxillary sinus opacification. Electronically Signed   By: Kerby Moors M.D.   On: 06/07/2017 09:44    EKG: Independently reviewed.  Assessment/Plan Active Problems:   * No active hospital problems. *   1] acute stroke patient presented with complaints of forgetting how to write and dragging right leg And right-sided weakness.  Patient was given an aspirin in the ER.  The symptoms started 2 days prior to admission.  CT scan shows no acute findings.  Will order MRI of the brain and carotid ultrasound.  Patient is allergic to iodine he reports that he gets hives he had this reaction many years ago that he cannot remember.  However he does remember he had the hives and he was taken to the hospital.  We will start aspirin and Lipitor check hemoglobin A1c lipid panel.  Patient will be transferred to [facility] to be seen  and followed by neurology.  Patient is requesting food he has no difficulty swallowing or speaking I will order him carb modified diet.  2] diabetes patient reports that he has prediabetes and he takes metformin.  I will hold his metformin during hospital stay put him on sliding scale insulin as needed.  Restart metformin prior to discharge.  His hemoglobin A1c was 5.9 in December 2018.  DVT prophylaxis:  Lovenox Code Status: Full code Family Communication: No family available Disposition Plan: To be determined Consults called: ED physician called neurologist consult and spoke to Dr. Lorraine Lax Admission status: Inpatient   Georgette Shell MD [facility]  If 7PM-7AM, please contact night-coverage [email or web address] Password [facility]  06/07/2017, 12:43 PM

## 2017-06-07 NOTE — ED Notes (Signed)
Patient transported to CT 

## 2017-06-07 NOTE — Progress Notes (Signed)
  Echocardiogram 2D Echocardiogram has been performed.  Timothy Brewer 06/07/2017, 4:06 PM

## 2017-06-07 NOTE — ED Provider Notes (Signed)
[facility] DEPT Provider Note   CSN: [id number] Arrival date & time: 06/07/17  1025     History   Chief Complaint Chief Complaint  Patient presents with  . Stroke Symptoms    HPI Timothy Brewer is a 77 y.o. male.  HPI Male with history of borderline diabetes, high cholesterol presents today complaining of sudden onset of difficulty writing and right-sided weakness that began on Thursday which is 2 days prior to evaluation.  He states that since that time he has had trouble walking with listing to the right and intermittently dragging his right foot.  He continues to have difficulty writing.  He is right-hand dominant.  He denies visual changes, sensory changes, or difficulty speaking or seeing.  He denies any similar symptoms in the past.  He has not been told that he had irregular heartbeat.  His any history of neurological abnormality including stroke.  He does state that in December he had an episode of dizziness that was momentary and resolved. Past Medical History:  Diagnosis Date  . Arthritis   . Pre-diabetes    on metformin    Patient Active Problem List   Diagnosis Date Noted  . Overweight (BMI 25.0-29.9) 02/18/2017  . 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 Plantar fasciitis 06/09/2016    Past Surgical History:  Procedure Laterality Date  . BACK SURGERY     c3,c4,c5,c6 ,l4,l5  . EYE SURGERY     bil cataracts  . NASAL SEPTUM SURGERY    . TONSILLECTOMY    . 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   Procedure: LEFT TOTAL HIP ARTHROPLASTY ANTERIOR APPROACH;  Surgeon: Paralee Cancel, MD;  Location: [facility];  Service: Orthopedics;  Laterality: Left;  70 mins        Home Medications    Prior to Admission medications   Medication Sig Start Date End Date Taking? Authorizing Provider  atorvastatin (LIPITOR) 20 MG tablet Take 20 mg by mouth 2 (two) times a week.  04/18/16   [provider]  Calcium Polycarbophil (FIBER-CAPS PO) Take 1  capsule by mouth 2 (two) times daily.     [provider]  cetirizine (ZYRTEC) 10 MG tablet Take 10 mg by mouth at bedtime.    [provider]  clobetasol cream (TEMOVATE) 8.52 % Apply 1 application topically daily.    [provider]  docusate sodium (COLACE) 100 MG capsule Take 1 capsule (100 mg total) by mouth 2 (two) times daily. 02/17/17   Danae Orleans, PA-C  ferrous sulfate (FERROUSUL) 325 (65 FE) MG tablet Take 1 tablet (325 mg total) by mouth 3 (three) times daily with meals. 02/17/17   Danae Orleans, PA-C  fexofenadine (ALLEGRA) 180 MG tablet Take 180 mg by mouth daily.    [provider]  HYDROcodone-acetaminophen (NORCO) 7.5-325 MG tablet Take 1-2 tablets by mouth every 4 (four) hours as needed for moderate pain or severe pain. 02/17/17   Danae Orleans, PA-C  metFORMIN (GLUCOPHAGE-XR) 500 MG 24 hr tablet Take 500 mg by mouth 2 (two) times daily.  04/18/16   [provider]  methocarbamol (ROBAXIN) 500 MG tablet Take 1 tablet (500 mg total) by mouth every 6 (six) hours as needed for muscle spasms. 02/17/17   Danae Orleans, PA-C  Multiple Vitamin (MULTIVITAMIN) tablet Take 1 tablet by mouth daily.    [provider]  Multiple Vitamins-Minerals (PRESERVISION AREDS 2 PO) Take 1 capsule by mouth 2 (two) times daily.    [provider]  OVER THE COUNTER MEDICATION Take 2 tablets by mouth daily. Active PK Supplement    [provider]  polyethylene glycol (MIRALAX / GLYCOLAX) packet Take 17 g by mouth 2 (two) times daily. 02/17/17   Danae Orleans, PA-C    Family History No family history on file.  Social History Social History   Tobacco Use  . Smoking status: Current Every Day Smoker    Types: Cigarettes  . Smokeless tobacco: Never Used  . Tobacco comment: 8-10 a day  Substance Use Topics  . Alcohol use: Yes    Alcohol/week: 0.6 oz    Types: 1 Glasses of wine per week    Comment: socially  . Drug use:  No     Allergies   Iodine   Review of Systems Review of Systems  All other systems reviewed and are negative.    Physical Exam Updated Vital Signs BP (!) 158/80 (BP Location: Left Arm)   Pulse (!) 107   Temp 98 F (36.7 C) (Oral)   Resp 18   SpO2 98%   Physical Exam  Constitutional: He is oriented to person, place, and time. He appears well-developed and well-nourished.  HENT:  Head: Normocephalic and atraumatic.  Right Ear: External ear normal.  Left Ear: External ear normal.  Nose: Nose normal.  Mouth/Throat: Oropharynx is clear and moist.  Eyes: Pupils are equal, round, and reactive to light. EOM are normal.  Neck: Normal range of motion. Neck supple.  Pulmonary/Chest: Effort normal and breath sounds normal.  Abdominal: Soft. Bowel sounds are normal.  Musculoskeletal: He exhibits edema.  Neurological: He is alert and oriented to person, place, and time. He displays normal reflexes. No cranial nerve deficit.  Right leg drift on exam with normal left leg strength Right palmar drift Deep tendon reflexes equal bilateral patella and ankle Face is equal with equal smile and eyebrow raises.  Skin: Skin is warm and dry. Capillary refill takes less than 2 seconds.  Psychiatric: He has a normal mood and affect.  Nursing note and vitals reviewed.    ED Treatments / Results  Labs (all labs ordered are listed, but only abnormal results are displayed) Labs Reviewed - No data to display  EKG None  Radiology No results found.  Procedures Procedures (including critical care time)  Medications Ordered in ED Medications - No data to display   Initial Impression / Assessment and Plan / ED Course  I have reviewed the triage vital signs and the nursing notes.  Pertinent labs & imaging results that were available during my care of the patient were reviewed by me and considered in my medical decision making (see chart for details).     77 year old man presents today  with strokelike symptoms with right-sided weakness.  Head CT shows no evidence of acute abnormality.  TPA not given due to patient being outside of the timeframe.  Patient's care discussed with Dr. Lorraine Lax.  He advises that patient should have CT Angio of head and states this can be performed after admission to [facility].  Discussed with Dr. Zigmund Daniel on-call for hospitalist.  She will see patient and patient is to be transferred to [facility]  Final Clinical Impressions(s) / ED Diagnoses   Final diagnoses:  Cerebrovascular accident (CVA), unspecified mechanism [facility])    ED Discharge Orders    None       Pattricia Boss, MD 06/07/17 1153

## 2017-06-07 NOTE — ED Notes (Signed)
[facility] has been notified to transport pt to [facility]

## 2017-06-07 NOTE — Plan of Care (Signed)
  Problem: Pain Managment: Goal: General experience of comfort will improve Outcome: Completed/Met

## 2017-06-08 ENCOUNTER — Encounter (HOSPITAL_COMMUNITY): Payer: Self-pay | Admitting: Radiology

## 2017-06-08 ENCOUNTER — Inpatient Hospital Stay (HOSPITAL_COMMUNITY): Payer: PPO

## 2017-06-08 DIAGNOSIS — I639 Cerebral infarction, unspecified: Secondary | ICD-10-CM

## 2017-06-08 DIAGNOSIS — I6300 Cerebral infarction due to thrombosis of unspecified precerebral artery: Secondary | ICD-10-CM

## 2017-06-08 LAB — LIPID PANEL
CHOLESTEROL: 180 mg/dL (ref 0–200)
HDL: 56 mg/dL (ref >40)
LDL Cholesterol: 100 mg/dL — ABNORMAL HIGH (ref 0–99)
Total CHOL/HDL Ratio: 3.2 RATIO
Triglycerides: 121 mg/dL (ref <150)
VLDL: 24 mg/dL (ref 0–40)

## 2017-06-08 LAB — VITAMIN B12: Vitamin B-12: 403 pg/mL (ref 180–914)

## 2017-06-08 LAB — GLUCOSE, CAPILLARY
Glucose-Capillary: 115 mg/dL — ABNORMAL HIGH (ref 65–99)
Glucose-Capillary: 80 mg/dL (ref 65–99)

## 2017-06-08 LAB — HEMOGLOBIN A1C
Hgb A1c MFr Bld: 6 % — ABNORMAL HIGH (ref 4.8–5.6)
Mean Plasma Glucose: 125.5 mg/dL

## 2017-06-08 MED ORDER — CLOPIDOGREL BISULFATE 75 MG PO TABS: 75.0000 mg | ORAL_TABLET | Freq: Every day | ORAL | 0 refills | Status: DC

## 2017-06-08 MED ORDER — ATORVASTATIN CALCIUM 20 MG PO TABS: 20.0000 mg | ORAL_TABLET | Freq: Every day | ORAL | 0 refills | Status: DC

## 2017-06-08 MED ORDER — ASPIRIN EC 81 MG PO TBEC: 81.0000 mg | DELAYED_RELEASE_TABLET | Freq: Every day | ORAL | 0 refills | Status: DC

## 2017-06-08 MED ORDER — CLOPIDOGREL BISULFATE 75 MG PO TABS
75.0000 mg | ORAL_TABLET | Freq: Every day | ORAL | Status: DC
  Administered 2017-06-08: 75 mg via ORAL
  Filled 2017-06-08: qty 1

## 2017-06-08 MED ORDER — ASPIRIN EC 81 MG PO TBEC
81.0000 mg | DELAYED_RELEASE_TABLET | Freq: Every day | ORAL | Status: DC
  Administered 2017-06-08: 81 mg via ORAL
  Filled 2017-06-08: qty 1

## 2017-06-08 MED ORDER — ATORVASTATIN CALCIUM 10 MG PO TABS: 20.0000 mg | ORAL_TABLET | Freq: Every day | ORAL | Status: DC

## 2017-06-08 NOTE — Care Management Note (Signed)
Case Management Note  Patient Details  Name: ZYMIRE TURNBO MRN: [id number] Date of Birth: June 14, 1940  Subjective/Objective:                    Action/Plan: Pt discharging home with self care. CM consulted for outpatient therapy. Pt would like to attend [employer]. Order in epic and information on the AVS.  Pt has PCP, insurance and transportation home.     Expected Discharge Date:  06/08/17               Expected Discharge Plan:  OP Rehab  In-House Referral:     Discharge planning Services  CM Consult  Post Acute Care Choice:    Choice offered to:     DME Arranged:    DME Agency:     HH Arranged:    HH Agency:     Status of Service:  Completed, signed off  If discussed at [employer] of Stay Meetings, dates discussed:    Additional Comments:  Pollie Friar, RN 06/08/2017, 2:46 PM

## 2017-06-08 NOTE — Progress Notes (Signed)
TCD completed. Oda Cogan, BS, RDMS, RVT

## 2017-06-08 NOTE — Progress Notes (Signed)
PT Cancellation Note  Patient Details Name: Timothy Brewer MRN: [id number] DOB: [birth year]   Cancelled Treatment:    Reason Eval/Treat Not Completed: Active bedrest order Will await increase in activity orders prior to PT evaluation. Will follow.   [name] 06/08/2017, 7:56 AM Wray Kearns, [place], DPT [phone]

## 2017-06-08 NOTE — Evaluation (Signed)
Physical Therapy Evaluation Patient Details Name: Timothy Brewer MRN: [id number] DOB: Jan 18, 1941 Today's Date: 06/08/2017   History of Present Illness  Patient is a 77 y/o male who presents with difficulty writing and right sided weakness. Head CT-unremarkable. 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 PMH includes pre-diabetes, left THA.  Clinical Impression  Patient presents with incoordination RUE/LE, difficulty with fine motor tasks, slow processing and impaired mobility s/p above. Tolerated gait training with Min guard-Min A for balance/safety due to impaired coordination and weakness of RLE. When fatigued, pt dragging RLE. Pt aware of deficits and able to correct as needed. Encouraged functional tasks utilizing [place], piano, buttons etc. Pt independent PTA and lives alone but has a friend that can stay with him at d/c. Would benefit from CIR to maximize independence and mobility prior to return home. Will follow acutely.    Follow Up Recommendations Outpatient PT;Supervision for mobility/OOB(neuro OPPT)    Equipment Recommendations  None recommended by PT    Recommendations for Other Services       Precautions / Restrictions Precautions Precautions: Fall Restrictions Weight Bearing Restrictions: No      Mobility  Bed Mobility Overal bed mobility: Modified Independent             General bed mobility comments: No assist needed.   Transfers Overall transfer level: Needs assistance Equipment used: None Transfers: Sit to/from Stand Sit to Stand: Min guard         General transfer comment: Min guard for safety. Stood from [employer], reaching to hold onto counter for support.   Ambulation/Gait Ambulation/Gait assistance: Min assist;Min guard Ambulation Distance (Feet): 250 Feet Assistive device: 1 person hand held assist Gait Pattern/deviations: Step-through pattern;Decreased stride length;Decreased step length -  right;Narrow base of support;Staggering left;Staggering right Gait velocity: decreased   General Gait Details: Slow, unsteady gait with incoordination of RLE, staggering and decreased foot clearance esp when fatigued. Pt aware of this. 1 standing rest break.   Stairs            Wheelchair Mobility    Modified Rankin (Stroke Patients Only) Modified Rankin (Stroke Patients Only) Pre-Morbid Rankin Score: No significant disability Modified Rankin: Moderately severe disability     Balance Overall balance assessment: Needs assistance Sitting-balance support: Feet supported;No upper extremity supported Sitting balance-Leahy Scale: Good     Standing balance support: During functional activity Standing balance-Leahy Scale: Fair                               Pertinent Vitals/Pain Pain Assessment: No/denies pain    Home Living Family/patient expects to be discharged to:: Private residence Living Arrangements: Alone Available Help at Discharge: Friend(s);Available 24 hours/day Type of Home: Apartment Home Access: Stairs to enter Entrance Stairs-Rails: Right Entrance Stairs-Number of Steps: 15 Home Layout: One level Home Equipment: Walker - 2 wheels;Cane - single point      Prior Function Level of Independence: Independent         Comments: Likes to play piano, drives.      Hand Dominance   Dominant Hand: Right    Extremity/Trunk Assessment   Upper Extremity Assessment Upper Extremity Assessment: Defer to OT evaluation;RUE deficits/detail RUE Deficits / Details: impaired FTN with some dysmetria, difficulty with fine motor tasks- buttons, writing RUE Coordination: decreased fine motor;decreased gross motor    Lower Extremity Assessment Lower Extremity Assessment: RLE deficits/detail RLE Deficits / Details: Grossly ~  4/5 throughout, Proprioception WFL at hallux RLE Coordination: decreased fine motor;decreased gross motor       Communication    Communication: No difficulties(slower processing per pt report)  Cognition Arousal/Alertness: Awake/alert Behavior During Therapy: WFL for tasks assessed/performed Overall Cognitive Status: Impaired/Different from baseline                                 General Comments: Reports some slow processing from baseline.      General Comments General comments (skin integrity, edema, etc.): Practiced writing numbers, doing crossword puzzles, and doing buttons with some difficulty.     Exercises     Assessment/Plan    PT Assessment Patient needs continued PT services  PT Problem List Decreased strength;Decreased balance;Decreased coordination       PT Treatment Interventions Balance training;Patient/family education;Gait training;Stair training;Therapeutic exercise;Neuromuscular re-education;Therapeutic activities    PT Goals (Current goals can be found in the Care Plan section)  Acute Rehab PT Goals Patient Stated Goal: to be able to play the piano and get back to PLOF PT Goal Formulation: With patient Time For Goal Achievement: 06/22/17 Potential to Achieve Goals: Good    Frequency Min 4X/week   Barriers to discharge Decreased caregiver support lives alone and has 1 flight of stairs to get into apt but friend able to help    Co-evaluation               AM-PAC PT "6 Clicks" Daily Activity  Outcome Measure Difficulty turning over in bed (including adjusting bedclothes, sheets and blankets)?: None Difficulty moving from lying on back to sitting on the side of the bed? : None Difficulty sitting down on and standing up from a chair with arms (e.g., wheelchair, bedside commode, etc,.)?: None Help needed moving to and from a bed to chair (including a wheelchair)?: A Little Help needed walking in hospital room?: A Little Help needed climbing 3-5 steps with a railing? : A Little 6 Click Score: 21    End of Session Equipment Utilized During Treatment: Gait  belt Activity Tolerance: Patient tolerated treatment well Patient left: in bed;with call bell/phone within reach;with bed alarm set Nurse Communication: Mobility status PT Visit Diagnosis: Unsteadiness on feet (R26.81);Muscle weakness (generalized) (M62.81);Difficulty in walking, not elsewhere classified (R26.2);Hemiplegia and hemiparesis Hemiplegia - Right/Left: Right Hemiplegia - dominant/non-dominant: Dominant Hemiplegia - caused by: Cerebral infarction    Time: 1255-1326 PT Time Calculation (min) (ACUTE ONLY): 31 min   Charges:   PT Evaluation $PT Eval Moderate Complexity: 1 Mod PT Treatments $Gait Training: 8-22 mins   PT G Codes:        Wray Kearns, PT, DPT [phone]    Marguarite Arbour A Seraya Jobst 06/08/2017, 1:40 PM

## 2017-06-08 NOTE — Discharge Summary (Signed)
Physician Discharge Summary  [name]:[id number] DOB: [birth year] DOA: 06/07/2017  PCP: Antony Contras, MD  Admit date: 06/07/2017 Discharge date: 06/08/2017  Admitted From: Home  Disposition:  Home   Recommendations for Outpatient Follow-up:  1. Follow up with PCP in 1-2 weeks 2. Please obtain BMP/CBC in one week 3. Assess BP start medication if Hypertensive.    Home Health; out patient PT  Discharge Condition: Stable.  CODE STATUS: full code.  Diet recommendation: Heart Healthy   Brief/Interim Summary: Timothy Brewer is a 77 y.o. 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  He reported that he was keeping his core at the game and forgot her to write.  He thought it will go away but then he also had right-sided weakness and started dragging his right leg.  Since the symptoms did not go away he decided to come to the ER.  He denies any slurred speech difficulty eating drinking or swallowing.  He still has difficulty writing and weakness to his right upper and lower extremity.  He denies any headache nausea vomiting visual changes.  He has never had a stroke in the past.  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  Prior to the surgery he had an episode of nausea vomiting and dizziness which has been resolved.  When I saw him in the ER patient was awake alert oriented x3 answer all questions appropriately.  Able to follow commands.  Moving all extremities .  He reported that he was hungry and he wanted to eat.  Denies shortness of breath chest pain palpitations abdominal pain or urinary complaints.  ED Course: Sodium 139 potassium 4.1 BUN 21 creatinine 0.80 liver function test is normal, WBC 7.3 hemoglobin 14.8 platelet count 297.  INR 0.90  glucose 149 UA negative urine drug screen negative alcohol level less than 10.  EKG normal sinus rhythm no acute changes.  CT scan of the head shows no acute intracranial abnormality small vessel ischemic change and left maxillary sinus opacification.  1-Sub acute stroke, thalamic.  presented with complaints of forgetting how to write and dragging right leg And right-sided weakness.  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 Smoking cessation counseling.  LDL 100, Lipitor change from Twice a week to daily.  Carotid doppler; 1-39% stenosis.  ECHO; no evidence of thrombus.  Now on aspirin and plavix for 3 weeks, then plavix alone.  Out patient PT.  Permissive HTN, on admission BP in the 150, today at 129. monitor for HTN out patient.   2-DM; continue with metformin.   HLD; change Lipitor to daily.     Discharge Diagnoses:  Active Problems:   CVA (cerebral vascular accident) [facility])    Discharge Instructions  Discharge Instructions    Ambulatory referral to Neurology   Complete by:  As directed    Follow up with stroke clinic NP (Jessica Vanschaick or Cecille Rubin, if both not available, consider Dr. Antony Contras, Dr. Bess Harvest, or Dr. Sarina Ill) at [facility] in about 4 weeks.   Ambulatory referral to Occupational Therapy   Complete by:  As directed    Ambulatory referral to Physical Therapy  Complete by:  As directed    Diet - low sodium heart healthy   Complete by:  As directed    Increase activity slowly   Complete by:  As directed      Allergies as of 06/08/2017      Reactions   Iodine Hives, Other (See Comments)   Internal only      Medication List    STOP taking these medications   calcium carbonate 1250 (500 Ca) MG tablet Commonly known as:  OS-CAL - dosed in mg of elemental  calcium   cetirizine 10 MG tablet Commonly known as:  ZYRTEC   docusate sodium 100 MG capsule Commonly known as:  COLACE   HYDROcodone-acetaminophen 7.5-325 MG tablet Commonly known as:  NORCO   methocarbamol 500 MG tablet Commonly known as:  ROBAXIN   PRESERVISION AREDS 2 PO     TAKE these medications   aspirin EC 81 MG tablet Take 1 tablet (81 mg total) by mouth at bedtime.   atorvastatin 20 MG tablet Commonly known as:  LIPITOR Take 1 tablet (20 mg total) by mouth daily at 6 PM. What changed:  when to take this   cholecalciferol 1000 units tablet Commonly known as:  VITAMIN D Take 1,000 Units by mouth at bedtime.   clobetasol cream 0.05 % Commonly known as:  TEMOVATE Apply 1 application topically daily.   clopidogrel 75 MG tablet Commonly known as:  PLAVIX Take 1 tablet (75 mg total) by mouth daily. Start taking on:  06/09/2017   ferrous sulfate 325 (65 FE) MG tablet Commonly known as:  FERROUSUL Take 1 tablet (325 mg total) by mouth 3 (three) times daily with meals.   fexofenadine 180 MG tablet Commonly known as:  ALLEGRA Take 180 mg by mouth daily.   FIBER-CAPS PO Take 1 capsule by mouth 2 (two) times daily.   metFORMIN 500 MG 24 hr tablet Commonly known as:  GLUCOPHAGE-XR Take 500 mg by mouth 2 (two) times daily.   multivitamin tablet Take 1 tablet by mouth daily.   polyethylene glycol packet Commonly known as:  MIRALAX / GLYCOLAX Take 17 g by mouth 2 (two) times daily. What changed:    when to take this  reasons to take this      Follow-up Information    [facility] Follow up.   Specialty:  Rehabilitation Why:  They will contact you for the first appointment. Contact information: [address] [place] [id number] [place] [address] [phone]       [facility] Follow up in 4 week(s).   Specialty:  Neurology Why:  stroke clinic. office will call with  appt date and time. Contact information: [place] [phone]         Allergies  Allergen Reactions  . Iodine Hives and Other (See Comments)    Internal only    Consultations: Neurology   Procedures/Studies: Ct Head Wo Contrast  Result Date: 06/07/2017 CLINICAL DATA:  Right-sided weakness. EXAM: CT HEAD WITHOUT CONTRAST TECHNIQUE: Contiguous axial images were obtained from the base of the skull through the vertex without intravenous contrast. COMPARISON:  None FINDINGS: Brain: No evidence of acute infarction, hemorrhage, hydrocephalus, extra-axial collection or mass lesion/mass effect. There is mild diffuse low-attenuation within the subcortical and periventricular white matter compatible with chronic microvascular disease. Vascular: No hyperdense vessel or unexpected calcification. Skull: Normal. Negative for fracture or focal lesion. Sinuses/Orbits: Bubbly opacity within the left maxillary  sinus noted. The mastoid air cells are clear. Other: None. IMPRESSION: 1. No acute intracranial abnormalities. 2. Small vessel ischemic change 3. Left maxillary sinus opacification. Electronically Signed   By: Kerby Moors M.D.   On: 06/07/2017 09:44   Mr Brain Wo Contrast  Result Date: 06/08/2017 CLINICAL DATA:  RIGHT extremity weakness for 4 days. Follow-up stroke. EXAM: MRI HEAD WITHOUT CONTRAST TECHNIQUE: Multiplanar, multiecho pulse sequences of the brain and surrounding structures were obtained without intravenous contrast. COMPARISON:  CT HEAD June 07, 2017 FINDINGS: INTRACRANIAL CONTENTS: 1 cm reduced diffusion LEFT thalamus versus posterior limb of external capsule with nearly normalized ADC values and FLAIR T2 hyperintense signal. No susceptibility artifact to suggest hemorrhage. Scattered patchy supratentorial and pontine white matter FLAIR T2 hyperintensities. 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 Tiny basal ganglia T2 hyperintensities  associated with chronic small vessel ischemic disease. No midline shift, mass effect or masses. No abnormal extra-axial fluid collections. VASCULAR: Normal major intracranial vascular flow voids present at skull base. SKULL AND UPPER CERVICAL SPINE: No abnormal sellar expansion. No suspicious calvarial bone marrow signal. Craniocervical junction maintained. SINUSES/ORBITS: Mild paranasal sinus mucosal thickening. Trace bilateral mastoid air cell effusion.The included ocular globes and orbital contents are non-suspicious. Status post bilateral ocular lens implants. OTHER: None. IMPRESSION: 1. 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 2. 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 Electronically Signed   By: Elon Alas M.D.   On: 06/08/2017 05:01   Mr Cervical Spine Wo Contrast  Result Date: 06/08/2017 CLINICAL DATA:  RIGHT-sided weakness. Onset several days ago. Proprioception defects. Hyperreflexia. EXAM: MRI CERVICAL SPINE WITHOUT CONTRAST TECHNIQUE: Multiplanar, multisequence MR imaging of the cervical spine was performed. No intravenous contrast was administered. COMPARISON:  MR brain 06/08/2017.  CT head 06/07/2017. FINDINGS: Alignment: Physiologic. Vertebrae: No fracture, evidence of discitis, or bone lesion. Cord: Normal signal and morphology.  Posterior columns are normal. Posterior Fossa, vertebral arteries, paraspinal tissues: Unremarkable. Disc levels: C2-3: Annular bulge. Disc space narrowing. Facet arthropathy. No impingement. C3-4: Shallow central protrusion. Disc space narrowing. Facet arthropathy. BILATERAL C4 foraminal narrowing appears worse on the LEFT. C4-5: Status post ACDF. Solid appearing arthrodesis. No impingement. Susceptibility artifact from plate and screws. C5-6: Status post ACDF. Solid appearing arthrodesis. No impingement. C6-7: Annular bulge. Facet arthropathy. No impingement. Prominent anterior disc herniation  projects into the retropharyngeal soft tissues, doubtful clinical significance. C7-T1:  Facet arthropathy.  No impingement. IMPRESSION: Unremarkable appearing C4-C6 ACDF. 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 BILATERAL C4 foraminal narrowing appears worse on the LEFT. No significant spinal stenosis or cord compression. No intrinsic cord abnormality is observed with regard to the question of proprioception defects. Electronically Signed   By: Staci Righter M.D.   On: 06/08/2017 10:04      Subjective: He is feeling stable. His brain doesn't coordinate or does what he wants.    Discharge Exam: Vitals:   06/08/17 0500 06/08/17 1216  BP: (!) 144/75 129/69  Pulse: 81 83  Resp: 20 20  Temp: 98.4 F (36.9 C) 98.1 F (36.7 C)  SpO2: 97% 94%   Vitals:   06/07/17 2015 06/07/17 2353 06/08/17 0500 06/08/17 1216  BP: (!) 153/65 (!) 152/75 (!) 144/75 129/69  Pulse: 94 85 81 83  Resp: 20 20 20 20   Temp: 98.4 F (36.9 C) 98.8 F (37.1 C) 98.4 F (36.9 C) 98.1 F (36.7 C)  TempSrc: Oral Oral Oral Oral  SpO2: 95% 93%  97% 94%  Weight: 78 kg (171 lb 15.3 oz)     Height: 5\' 7"  (1.702 m)       General: Pt is alert, awake, not in acute distress Cardiovascular: RRR, S1/S2 +, no rubs, no gallops Respiratory: CTA bilaterally, no wheezing, no rhonchi Abdominal: Soft, NT, ND, bowel sounds + Extremities: no edema, no cyanosis    The results of significant diagnostics from this hospitalization (including imaging, microbiology, ancillary and laboratory) are listed below for reference.     Microbiology: No results found for this or any previous visit (from the past 240 hour(s)).   Labs: BNP (last 3 results) No results for input(s): BNP in the last 8760 hours. Basic Metabolic Panel: Recent Labs  Lab 06/07/17 0938 06/07/17 0946  NA 137 139  K 4.2 4.1  CL 105 103  CO2 23  --   GLUCOSE 152* 149*  BUN 22* 21*  CREATININE 0.87 0.80  CALCIUM  9.2  --    Liver Function Tests: Recent Labs  Lab 06/07/17 0938  AST 18  ALT 18  ALKPHOS 66  BILITOT 0.4  PROT 6.3*  ALBUMIN 3.4*   No results for input(s): LIPASE, AMYLASE in the last 168 hours. No results for input(s): AMMONIA in the last 168 hours. CBC: Recent Labs  Lab 06/07/17 0938 06/07/17 0946  WBC 7.3  --   NEUTROABS 4.7  --   HGB 14.8 15.0  HCT 43.0 44.0  MCV 89.6  --   PLT 297  --    Cardiac Enzymes: No results for input(s): CKTOTAL, CKMB, CKMBINDEX, TROPONINI in the last 168 hours. BNP: Invalid input(s): POCBNP CBG: Recent Labs  Lab 06/07/17 1711 06/07/17 2356 06/08/17 0640 06/08/17 1137  GLUCAP 114* 124* 115* 80   D-Dimer No results for input(s): DDIMER in the last 72 hours. Hgb A1c Recent Labs    06/08/17 0727  HGBA1C 6.0*   Lipid Profile Recent Labs    06/08/17 0727  CHOL 180  HDL 56  LDLCALC 100*  TRIG 121  CHOLHDL 3.2   Thyroid function studies No results for input(s): TSH, T4TOTAL, T3FREE, THYROIDAB in the last 72 hours.  Invalid input(s): FREET3 Anemia work up Recent Labs    06/08/17 0721  VITAMINB12 403   Urinalysis    Component Value Date/Time   COLORURINE YELLOW 06/07/2017 [place] 06/07/2017 1137   LABSPEC 1.010 06/07/2017 1137   PHURINE 7.0 06/07/2017 1137   GLUCOSEU NEGATIVE 06/07/2017 1137   HGBUR NEGATIVE 06/07/2017 1137   [place] 06/07/2017 1137   [place] 06/07/2017 1137   PROTEINUR NEGATIVE 06/07/2017 1137   NITRITE NEGATIVE 06/07/2017 1137   LEUKOCYTESUR NEGATIVE 06/07/2017 1137   Sepsis Labs Invalid input(s): PROCALCITONIN,  WBC,  LACTICIDVEN Microbiology No results found for this or any previous visit (from the past 240 hour(s)).   Time coordinating discharge: Over 30 minutes  SIGNED:   Elmarie Shiley, MD  [facility] 06/08/2017, 2:54 PM Pager [phone]  If 7PM-7AM, please contact night-coverage [email or web address] Password [email or web address]

## 2017-06-08 NOTE — Progress Notes (Addendum)
STROKE TEAM PROGRESS NOTE   INTERVAL HISTORY His vascular tech is at the bedside.  No family present. He reports difficulty controlling his R arm and leg in space, knowing where they are. Friends arrived during rounds. No new complaints.  CBC:  Recent Labs  Lab 06/07/17 0938 06/07/17 0946  WBC 7.3  --   NEUTROABS 4.7  --   HGB 14.8 15.0  HCT 43.0 44.0  MCV 89.6  --   PLT 297  --     Basic Metabolic Panel:  Recent Labs  Lab 06/07/17 0938 06/07/17 0946  NA 137 139  K 4.2 4.1  CL 105 103  CO2 23  --   GLUCOSE 152* 149*  BUN 22* 21*  CREATININE 0.87 0.80  CALCIUM 9.2  --    Lipid Panel:     Component Value Date/Time   CHOL 180 06/08/2017 0727   TRIG 121 06/08/2017 0727   HDL 56 06/08/2017 0727   CHOLHDL 3.2 06/08/2017 0727   VLDL 24 06/08/2017 0727   LDLCALC 100 (H) 06/08/2017 0727   HgbA1c:  Lab Results  Component Value Date   HGBA1C 6.0 (H) 06/08/2017   Urine Drug Screen:     Component Value Date/Time   LABOPIA NONE DETECTED 06/07/2017 1137   COCAINSCRNUR NONE DETECTED 06/07/2017 1137   LABBENZ NONE DETECTED 06/07/2017 1137   AMPHETMU NONE DETECTED 06/07/2017 1137   THCU NONE DETECTED 06/07/2017 1137   LABBARB NONE DETECTED 06/07/2017 1137    Alcohol Level     Component Value Date/Time   ETH <10 06/07/2017 0938     PHYSICAL EXAM Vitals:   06/07/17 1935 06/07/17 2015 06/07/17 2353 06/08/17 0500  BP: (!) 183/84 (!) 153/65 (!) 152/75 (!) 144/75  Pulse: 100 94 85 81  Resp: (!) 25 20 20 20   Temp:  98.4 F (36.9 C) 98.8 F (37.1 C) 98.4 F (36.9 C)  TempSrc:  Oral Oral Oral  SpO2: 90% 95% 93% 97%  Weight:  78 kg (171 lb 15.3 oz)    Height:  5\' 7"  (1.702 m)     Mental Status: Alert, oriented, thought content appropriate.  Speech fluent without evidence of aphasia.  Able to follow all commands without difficulty. Cranial Nerves: II:  Visual fields intact without extinction to DSS. PERRL  III,IV, VI: EOMI without nystagmus. No ptosis V,VII:  Smile symmetric, facial temp sensation equal bilaterally VIII: hearing intact to voice IX,X: Palate rises symmetrically XI: Symmetric shoulder shrug XII: midline tongue extension  Motor: Right :  Upper extremity   5/5                                      Left:     Upper extremity   5/5             Lower extremity   5/5                                                  Lower extremity   5/5 Normal tone throughout; no atrophy noted. Diminished fine finger movements on the right. Orbits left-to-right approximately. No drift Sensory: Temp and light touch intact proximally in all 4 ext without extinction Proprioception impaired on the right, leg > arm Mild orbiting of L over  R Plantars: Right: downgoing               Left: downgoing Cerebellar: No ataxia HS or FNF bilaterally Gait: Deferred   ASSESSMENT/PLAN Timothy Brewer is a 77 y.o. male with history of HLD and pre-DB presenting with R sided weakness since 3/28. He did not receive IV t-PA due to delay in arrival.   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 Small vessel disease. L maxillary sinus opacification   MRI  Early small L thalamic lacune. Old R thalamic lacune. Small vessel disease. Atrophy.  MRI CSpine C4-6 unremarkable. C3-4 shallow central perfusion and facet arthopathy. B C4 L>R foraminal narrowing (hx previous C3, C4 surgery). No significant spinal stenosis or ord compression. No intrinsic cord abnormality  TCD  pending   Carotid Doppler  B ICA 1-39% stenosis, VAs antegrade   2D Echo  EF 60-65%. No source of embolus   Lovenox 40 mg sq daily for VTE prophylaxis  Fall precautions  Diet Carb Modified Fluid consistency: Thin; Room service appropriate? Yes  aspirin 81 mg daily prior to admission, now on aspirin 325 mg daily. Given TIA/stroke in setting of small vessel disease, will place on aspirin 81 mg  and plavix 75 mg daily x 3 weeks, then Plavix alone. Orders adjusted.   Therapy recommendations:  Ok to be OOB from stroke standpoint. Orders placed -> OP OT and PT. Orders placed for care management to arrange  Disposition:  Home w/ OP therapies  Follow up Stroke clinic in 4 weeks. Orders placed  Blood pressure  No hx Hypertension  slightly elevated 150s . Permissive hypertension (OK if < 220/120) but gradually normalize in 5-7 days . Long-term BP goal normotensive  Hyperlipidemia  Home meds:  lipitor 20 on Sun and Wed only, placed on lipitor 40 mg daily here.   decrease Lipitor to 20 mg every day  LDL 100, goal < 70  Continue statin at discharge  [place] PTA   HgbA1c 6.0, goal < 7.0  Controlled  Other Stroke Risk Factors  Advanced age  Cigarette smoker, advised to stop smoking  ETOH use, advised no more than 2 drinks/day  UDS / ETOH level negative   Hospital day # [place], MSN, APRN, ANVP-BC, AGPCNP-BC Advanced Practice Stroke Nurse Timothy Brewer for Schedule & Pager information 06/08/2017 10:26 AM  I have personally examined this patient, reviewed notes, independently viewed imaging studies, participated in medical decision making and plan of care.ROS completed by me personally and pertinent positives fully documented  I have made any additions or clarifications directly to the above note. Agree with note above.  He presented with left thalamic infarct due to small vessel disease. Recommend dual antiplatelet therapy for 3 weeks followed by Plavix alone. Maintain aggressive risk factor modification. Follow-up as an outpatient in the stroke clinic in 6 weeks. Greater than 50% time during this 25 minute visit was spent on counseling and coordination of care about his lacunar infarct and answering questions.  Timothy Contras, Timothy Brewer Medical Director [place] Pager: [phone] 06/08/2017 4:43 PM  To  contact Stroke Continuity provider, please refer to [email or web address] After hours, contact General Neurology

## 2017-06-08 NOTE — Care Management Note (Signed)
Case Management Note  Patient Details  Name: Timothy Brewer MRN: [id number] Date of Birth: 07-30-1940  Subjective/Objective:    Pt admitted with CVA. He is from home alone.                Action/Plan: Awaiting PT/OT recommendations. CM following for d/c needs, physician orders.   Expected Discharge Date:  06/08/17               Expected Discharge Plan:     In-House Referral:     Discharge planning Services     Post Acute Care Choice:    Choice offered to:     DME Arranged:    DME Agency:     HH Arranged:    HH Agency:     Status of Service:  In process, will continue to follow  If discussed at Long Length of Stay Meetings, dates discussed:    Additional Comments:  Pollie Friar, RN 06/08/2017, 11:55 AM

## 2017-06-08 NOTE — Evaluation (Signed)
SLP Cancellation Note  Patient Details Name: Timothy Brewer MRN: [id number] DOB: December 12, 1940   Cancelled treatment:       Reason Eval/Treat Not Completed: Patient at procedure or test/unavailable  Will continue efforts. Luanna Salk, [place] [facility] SLP [phone]    Macario Golds 06/08/2017, 10:36 AM

## 2017-06-08 NOTE — Progress Notes (Signed)
Physical Therapy Treatment Patient Details Name: Timothy Brewer MRN: [id number] DOB: 1940-12-01 Today's Date: 06/08/2017    History of Present Illness Patient is a 77 y/o male who presents with difficulty writing and right sided weakness. Head CT-unremarkable. 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 PMH includes pre-diabetes, left THA.    PT Comments    Seen for second session this PM as pt discharging today and needs to negotiate 1 flight of stairs to get into his apt. Education on safe stair negotiation and technique. Pt understands not to be distracted when negotiating steps and to have his lady friend with him for support. Plans to have friend stay with him for a few days until coordination/strength improve. Continue to recommend neuro OPPT to improve overall functional mobility and safety.    Follow Up Recommendations  Outpatient PT;Supervision for mobility/OOB(neuro OPPT)     Equipment Recommendations  None recommended by PT    Recommendations for Other Services       Precautions / Restrictions Precautions Precautions: Fall Restrictions Weight Bearing Restrictions: No    Mobility  Bed Mobility Overal bed mobility: Modified Independent             General bed mobility comments: Walking in hallway with OT upon PT arrival.   Transfers Overall transfer level: Needs assistance Equipment used: None Transfers: Sit to/from Stand Sit to Stand: Min guard         General transfer comment: Min guard for safety.  Ambulation/Gait Ambulation/Gait assistance: Min guard Ambulation Distance (Feet): 150 Feet Assistive device: None Gait Pattern/deviations: Step-through pattern;Decreased stride length;Decreased step length - right;Narrow base of support;Staggering left;Staggering right Gait velocity: decreased   General Gait Details: More steady gait than previously with some mild incoordination RLE. Drifting at times but no overt  LOB.   Stairs Stairs: Yes   Stair Management: Alternating pattern;Step to pattern;One rail Right Number of Stairs: 13 General stair comments: Cues for safety and technique. Cues to stay not distracted when negotiating stairs.   Wheelchair Mobility    Modified Rankin (Stroke Patients Only) Modified Rankin (Stroke Patients Only) Pre-Morbid Rankin Score: No significant disability Modified Rankin: Moderately severe disability     Balance Overall balance assessment: Needs assistance Sitting-balance support: Feet supported;No upper extremity supported Sitting balance-Leahy Scale: Good     Standing balance support: During functional activity Standing balance-Leahy Scale: Fair Standing balance comment: ABle to manipulate things in the room without UE support and no LOB>                            Cognition Arousal/Alertness: Awake/alert Behavior During Therapy: WFL for tasks assessed/performed Overall Cognitive Status: Impaired/Different from baseline Area of Impairment: Problem solving                             Problem Solving: Slow processing General Comments: Reports some slow processing from baseline.      Exercises      General Comments General comments (skin integrity, edema, etc.): Provided handout on FM exercises. Pt verbalizing understanding      Pertinent Vitals/Pain Pain Assessment: No/denies pain    Home Living Family/patient expects to be discharged to:: Private residence Living Arrangements: Alone Available Help at Discharge: Friend(s);Available 24 hours/day Type of Home: Apartment Home Access: Stairs to enter Entrance Stairs-Rails: Right Home Layout: One level Home Equipment: Walker - 2 wheels;Cane - single point  Additional Comments: plans to stay with friend with no steps    Prior Function Level of Independence: Independent      Comments: ADLs, AIDLs, driving and enjoys playing piano   PT Goals (current goals can now  be found in the care plan section) Acute Rehab PT Goals Patient Stated Goal: to be able to play the piano and get back to PLOF PT Goal Formulation: With patient Time For Goal Achievement: 06/22/17 Potential to Achieve Goals: Good Progress towards PT goals: Progressing toward goals    Frequency    Min 4X/week      PT Plan Current plan remains appropriate    Co-evaluation              AM-PAC PT "6 Clicks" Daily Activity  Outcome Measure  Difficulty turning over in bed (including adjusting bedclothes, sheets and blankets)?: None Difficulty moving from lying on back to sitting on the side of the bed? : None Difficulty sitting down on and standing up from a chair with arms (e.g., wheelchair, bedside commode, etc,.)?: None Help needed moving to and from a bed to chair (including a wheelchair)?: None Help needed walking in hospital room?: A Little Help needed climbing 3-5 steps with a railing? : A Little 6 Click Score: 22    End of Session Equipment Utilized During Treatment: Gait belt Activity Tolerance: Patient tolerated treatment well Patient left: in bed;with call bell/phone within reach(sitting EOB.) Nurse Communication: Mobility status PT Visit Diagnosis: Unsteadiness on feet (R26.81);Muscle weakness (generalized) (M62.81);Difficulty in walking, not elsewhere classified (R26.2);Hemiplegia and hemiparesis Hemiplegia - Right/Left: Right Hemiplegia - dominant/non-dominant: Dominant Hemiplegia - caused by: Cerebral infarction     Time: 9357-0177 PT Time Calculation (min) (ACUTE ONLY): 8 min  Charges:  $Gait Training: 8-22 mins                    G Codes:       Wray Kearns, PT, DPT [phone]     [place] 06/08/2017, 3:49 PM

## 2017-06-08 NOTE — Evaluation (Signed)
Occupational Therapy Evaluation Patient Details Name: Timothy Brewer MRN: [id number] DOB: [birth year] Today's Date: 06/08/2017    History of Present Illness Patient is a 77 y/o male who presents with difficulty writing and right sided weakness. Head CT-unremarkable. 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 PMH includes pre-diabetes, left THA.   Clinical Impression   PTA pt was living alone and was independent. Pt currently performing ADLs and functional mobility at supervision-Min Guard A level for safety with slight balance deficits. Pt presenting with decreased balance and FM skills in RUE (dominant hand). Providing education on safe tub transfer techniques; pt demonstrating understanding. Providing education and handout on FM exercises for carry over to home; pt verbalized understanding and was thankful for information. Answering all questions in preparation for dc today. Recommend dc to home with follow up to neuro outpatient for further OT to optimize safety, independence with ADLs, and return to PLOF.      Follow Up Recommendations  Outpatient OT;Supervision - Intermittent(Neuro OP OT)    Equipment Recommendations  None recommended by OT    Recommendations for Other Services       Precautions / Restrictions Precautions Precautions: Fall Restrictions Weight Bearing Restrictions: No      Mobility Bed Mobility Overal bed mobility: Modified Independent             General bed mobility comments: No assist needed.   Transfers Overall transfer level: Needs assistance Equipment used: None Transfers: Sit to/from Stand Sit to Stand: Min guard         General transfer comment: Min guard for safety.    Balance Overall balance assessment: Needs assistance Sitting-balance support: Feet supported;No upper extremity supported Sitting balance-Leahy Scale: Good     Standing balance support: During functional activity Standing  balance-Leahy Scale: Fair                             ADL either performed or assessed with clinical judgement   ADL Overall ADL's : Needs assistance/impaired Eating/Feeding: Set up;Sitting   Grooming: Supervision/safety;Standing;Oral care   Upper Body Bathing: Min guard;Standing   Lower Body Bathing: Min guard;Sit to/from stand   Upper Body Dressing : Min guard;Standing Upper Body Dressing Details (indicate cue type and reason): Donning second gown like jacket while standing at sink Lower Body Dressing: Min guard;Sit to/from stand   Toilet Transfer: Supervision/safety;Ambulation Toilet Transfer Details (indicate cue type and reason): Simulated in room     Tub/ Shower Transfer: Tub transfer;Min guard;Ambulation Tub/Shower Transfer Details (indicate cue type and reason): Educating pt on safe tub transfer techniques with decreased balance Functional mobility during ADLs: Min guard General ADL Comments: Pt performing ADLs and functional mobility at supervision-Min guard A level for safety. Pt with slight balance deficits. Main complain is FM deficits in RUE (dominand hand).      Vision Baseline Vision/History: No visual deficits Patient Visual Report: No change from baseline       Perception     Praxis      Pertinent Vitals/Pain Pain Assessment: No/denies pain     Hand Dominance Right   Extremity/Trunk Assessment Upper Extremity Assessment Upper Extremity Assessment: RUE deficits/detail RUE Deficits / Details: Decreased fine motor skills as seen during finger opposition and ADLs. During targeted reach, pt undershooting and requiring increased time.  RUE Coordination: decreased fine motor;decreased gross motor   Lower Extremity Assessment Lower Extremity Assessment: Defer to PT evaluation RLE Deficits /  Details: Grossly ~4/5 throughout, Proprioception WFL at hallux RLE Coordination: decreased fine motor;decreased gross motor   Cervical / Trunk  Assessment Cervical / Trunk Assessment: Normal   Communication Communication Communication: No difficulties(slower processing per pt report)   Cognition Arousal/Alertness: Awake/alert Behavior During Therapy: WFL for tasks assessed/performed Overall Cognitive Status: Impaired/Different from baseline                                 General Comments: Reports some slow processing from baseline.   General Comments  Provided handout on FM exercises. Pt verbalizing understanding    Exercises     Shoulder Instructions      Home Living Family/patient expects to be discharged to:: Private residence Living Arrangements: Alone Available Help at Discharge: Friend(s);Available 24 hours/day Type of Home: Apartment Home Access: Stairs to enter Entrance Stairs-Number of Steps: 15 Entrance Stairs-Rails: Right Home Layout: One level     Bathroom Shower/Tub: Teacher, early years/pre: Standard     Home Equipment: Environmental consultant - 2 wheels;Cane - single point   Additional Comments: plans to stay with friend with no steps      Prior Functioning/Environment Level of Independence: Independent        Comments: ADLs, AIDLs, driving and enjoys playing piano        OT Problem List: Impaired balance (sitting and/or standing);Impaired UE functional use      OT Treatment/Interventions:      OT Goals(Current goals can be found in the care plan section) Acute Rehab OT Goals Patient Stated Goal: to be able to play the piano and get back to PLOF OT Goal Formulation: All assessment and education complete, DC therapy  OT Frequency:     Barriers to D/C:            Co-evaluation              AM-PAC PT "6 Clicks" Daily Activity     Outcome Measure Help from another person eating meals?: None Help from another person taking care of personal grooming?: None Help from another person toileting, which includes using toliet, bedpan, or urinal?: None Help from another  person bathing (including washing, rinsing, drying)?: A Little Help from another person to put on and taking off regular upper body clothing?: None Help from another person to put on and taking off regular lower body clothing?: A Little 6 Click Score: 22   End of Session Equipment Utilized During Treatment: Gait belt Nurse Communication: Mobility status  Activity Tolerance: Patient tolerated treatment well Patient left: (OOB with PT)  OT Visit Diagnosis: Other abnormalities of gait and mobility (R26.89);Muscle weakness (generalized) (M62.81)                Time: 3009-2330 OT Time Calculation (min): 14 min Charges:  OT General Charges $OT Visit: 1 Visit OT Evaluation $OT Eval Moderate Complexity: 1 Mod G-Codes:     [place] MSOT, OTR/L Acute Rehab Pager: [phone] Office: [place] 06/08/2017, 3:10 PM

## 2017-06-08 NOTE — Progress Notes (Signed)
OT Cancellation    06/08/17 0900  OT Visit Information  Last OT Received On 06/08/17  Reason Eval/Treat Not Completed Patient at procedure or test/ unavailable;Active bedrest order (With active bedrest orders. Off the floor for CT. Will return as schedule allows.)   Safire Gordin MSOT, OTR/L Acute Rehab Pager: [phone] Office: [phone]

## 2017-06-08 NOTE — Plan of Care (Signed)
Progressing with independent activites.

## 2017-06-08 NOTE — Discharge Instructions (Signed)
Take aspirin and plavix for three weeks. After that take only plavix.

## 2017-06-08 NOTE — Consult Note (Signed)
Referring Physician: Dr. Tyrell Antonio    Chief Complaint: Right sided weakness  HPI: Timothy Brewer is a right handed 77 y.o. male presenting with right sided weakness which began last Thursday. He first noticed this as difficulty using his right hand to write. He later noticed that he was slightly dragging his left leg when walking, but is unsure regarding exact time of onset. He states that he has trouble sensing where his right leg is while walking.   PMHx includes pre-diabetes and hypercholesterolemia. He has no prior history of stroke.   Past Medical History:  Diagnosis Date  . Arthritis   . Pre-diabetes    on metformin    Past Surgical History:  Procedure Laterality Date  . BACK SURGERY     c3,c4,c5,c6 ,l4,l5  . EYE SURGERY     bil cataracts  . NASAL SEPTUM SURGERY    . TONSILLECTOMY    . 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 Social History:  reports that he has been smoking cigarettes.  He has never used smokeless tobacco. He reports that he drinks about 0.6 oz of alcohol per week. He reports that he does not use drugs.  Allergies:  Allergies  Allergen Reactions  . Iodine Hives and Other (See Comments)    Internal only    Medications:  Prior to Admission:  Medications Prior to Admission  Medication Sig Dispense Refill Last Dose  . aspirin EC 81 MG tablet Take 81 mg by mouth at bedtime.   06/06/2017 at Unknown time  . atorvastatin (LIPITOR) 20 MG tablet Take 20 mg by mouth 2 (two) times a week.    06/07/2017 at Unknown time  . calcium carbonate (OS-CAL - DOSED IN MG OF ELEMENTAL CALCIUM) 1250 (500 Ca) MG tablet Take 1 tablet by mouth at bedtime.   06/06/2017 at Unknown time  . Calcium Polycarbophil (FIBER-CAPS PO) Take 1 capsule by mouth 2 (two) times daily.    06/07/2017 at Unknown time   . cetirizine (ZYRTEC) 10 MG tablet Take 10 mg by mouth at bedtime.   06/06/2017 at Unknown time  . cholecalciferol (VITAMIN D) 1000 units tablet Take 1,000 Units by mouth at bedtime.   06/06/2017 at Unknown time  . clobetasol cream (TEMOVATE) 4.62 % Apply 1 application topically daily.   Past Month at Unknown time  . fexofenadine (ALLEGRA) 180 MG tablet Take 180 mg by mouth daily.   06/07/2017 at Unknown time  . metFORMIN (GLUCOPHAGE-XR) 500 MG 24 hr tablet Take 500 mg by mouth 2 (two) times daily.    06/07/2017 at Unknown time  . Multiple Vitamin (MULTIVITAMIN) tablet Take 1 tablet by mouth daily.   06/07/2017 at Unknown time  . Multiple Vitamins-Minerals (PRESERVISION AREDS 2 PO) Take 1 capsule by mouth 2 (two) times daily.   06/07/2017 at Unknown time  . polyethylene glycol (MIRALAX / GLYCOLAX) packet Take 17 g by mouth 2 (two) times daily. (Patient taking differently: Take 17 g by mouth daily as needed for mild constipation. ) 14 each 0 06/06/2017 at Unknown time  . docusate sodium (COLACE) 100 MG capsule Take 1 capsule (100 mg total) by mouth 2 (two) times daily. (Patient not taking: Reported on 06/07/2017) 10 capsule 0 Not Taking at Unknown time  . ferrous sulfate (FERROUSUL) 325 (65 FE) MG tablet  Take 1 tablet (325 mg total) by mouth 3 (three) times daily with meals. (Patient not taking: Reported on 06/07/2017)  3 Not Taking at Unknown time  . HYDROcodone-acetaminophen (NORCO) 7.5-325 MG tablet Take 1-2 tablets by mouth every 4 (four) hours as needed for moderate pain or severe pain. (Patient not taking: Reported on 06/07/2017) 60 tablet 0 Not Taking at Unknown time  . methocarbamol (ROBAXIN) 500 MG tablet Take 1 tablet (500 mg total) by mouth every 6 (six) hours as needed for muscle spasms. (Patient not taking: Reported on 06/07/2017) 40 tablet 0 Not Taking at Unknown time   Scheduled: .  stroke: mapping our early stages of recovery book   Does not apply Once  . aspirin  325 mg Oral Daily  .  atorvastatin  40 mg Oral q1800  . enoxaparin (LOVENOX) injection  40 mg Subcutaneous Q24H  . insulin aspart  0-9 Units Subcutaneous TID WC   Continuous: . sodium chloride 1,000 mL (06/08/17 0301)    ROS: Denies headache or CP. No vision loss, trouble with speech, confusion, sensory numbness, facial droop, dysphagia or paresthesia. Other ROS as per HPI.   Physical Examination: Blood pressure (!) 144/75, pulse 81, temperature 98.4 F (36.9 C), temperature source Oral, resp. rate 20, height _0  (1.702 m), weight 78 kg (171 lb 15.3 oz), SpO2 97 %.  HEENT: [place]/AT Lungs: Respirations unlabored Ext: No edema  Neurologic Examination: Mental Status: Alert, oriented, thought content appropriate.  Speech fluent without evidence of aphasia.  Able to follow all commands without difficulty. Cranial Nerves: II:  Visual fields intact without extinction to DSS. PERRL  III,IV, VI: EOMI without nystagmus. No ptosis V,VII: Smile symmetric, facial temp sensation equal bilaterally VIII: hearing intact to voice IX,X: Palate rises symmetrically XI: Symmetric shoulder shrug XII: midline tongue extension  Motor: Right : Upper extremity   5/5    Left:     Upper extremity   5/5  Lower extremity   5/5     Lower extremity   5/5 Normal tone throughout; no atrophy noted No pronator drift Sensory: Temp and light touch intact proximally in all 4 ext without extinction Proprioception impaired bilateral toes, worse on the right.  Deep Tendon Reflexes:  Right biceps and brachioradialis 2+ Left biceps and brachioradialis 3+ Right patellar and achilles 2+ Left patellar 4+ (crossed adductor), Left achilles 3+ Plantars: Right: downgoing  Left: downgoing Cerebellar: No ataxia with FNF bilaterally Gait: Deferred  Results for orders placed or performed during the hospital encounter of 06/07/17 (from the past 48 hour(s))  Ethanol     Status: None   Collection Time: 06/07/17  9:38 AM  Result Value Ref Range    Alcohol, Ethyl (B) <10 <10 mg/dL    Comment:        LOWEST DETECTABLE LIMIT FOR SERUM ALCOHOL IS 10 mg/dL FOR MEDICAL PURPOSES ONLY Performed at [facility] [address] [place], [place] [address]   Protime-INR     Status: None   Collection Time: 06/07/17  9:38 AM  Result Value Ref Range   Prothrombin Time 12.0 11.4 - 15.2 seconds   INR 0.90     Comment: Performed at [facility], [place] [address], [place],  [address]  APTT     Status: None   Collection Time: 06/07/17  9:38 AM  Result Value Ref Range   aPTT 28 24 - 36 seconds    Comment: Performed at [facility], [place] [address] [place], [place] [address]  CBC     Status: None   Collection Time: 06/07/17  9:38 AM  Result Value Ref Range   WBC 7.3 4.0 - 10.5 K/uL   RBC 4.80 4.22 - 5.81 MIL/uL   Hemoglobin 14.8 13.0 - 17.0 g/dL   HCT 43.0 39.0 - 52.0 %   MCV 89.6 78.0 - 100.0 fL   MCH 30.8 26.0 - 34.0 pg   MCHC 34.4 30.0 - 36.0 g/dL   RDW 15.2 11.5 - 15.5 %   Platelets 297 150 - 400 K/uL    Comment: Performed at [facility], [place] [address], [place], [place] [address]  Differential     Status: None   Collection Time: 06/07/17  9:38 AM  Result Value Ref Range   Neutrophils Relative % 64 %   Neutro Abs 4.7 1.7 - 7.7 K/uL   Lymphocytes Relative 21 %   Lymphs Abs 1.5 0.7 - 4.0 K/uL   Monocytes Relative 10 %   Monocytes Absolute 0.7 0.1 - 1.0 K/uL   Eosinophils Relative 5 %   Eosinophils Absolute 0.3 0.0 - 0.7 K/uL   Basophils Relative 0 %   Basophils Absolute 0.0 0.0 - 0.1 K/uL    Comment: Performed at [facility], [place] [address], [place], [place] [address]  Comprehensive metabolic panel     Status: Abnormal   Collection Time: 06/07/17  9:38 AM  Result Value Ref Range   Sodium 137 135 - 145 mmol/L   Potassium 4.2 3.5 - 5.1 mmol/L   Chloride 105 101 - 111 mmol/L   CO2 23 22 - 32 mmol/L   Glucose, Bld 152 (H) 65 - 99  mg/dL   BUN 22 (H) 6 - 20 mg/dL   Creatinine, Ser 0.87 0.61 - 1.24 mg/dL   Calcium 9.2 8.9 - 10.3 mg/dL   Total Protein 6.3 (L) 6.5 - 8.1 g/dL   Albumin 3.4 (L) 3.5 - 5.0 g/dL   AST 18 15 - 41 U/L   ALT 18 17 - 63 U/L   Alkaline Phosphatase 66 38 - 126 U/L   Total Bilirubin 0.4 0.3 - 1.2 mg/dL   GFR calc non Af Amer >60 >60 mL/min   GFR calc Af Amer >60 >60 mL/min    Comment: (NOTE) The eGFR has been calculated using the CKD EPI equation. This calculation has not been validated in all clinical situations. eGFR's persistently <60 mL/min signify possible Chronic Kidney Disease.    Anion gap 9 5 - 15    Comment: Performed at [facility], [place] [address], [place], [place] [address]  I-stat troponin, ED     Status: None   Collection Time: 06/07/17  9:44 AM  Result Value Ref Range   Troponin i, poc 0.00 0.00 - 0.08 ng/mL   Comment 3            Comment: Due to the release kinetics of cTnI, a negative result within the first hours of the onset of symptoms does not rule out myocardial infarction with certainty. If myocardial infarction is still suspected, repeat the test at appropriate intervals.   I-Stat Chem 8, ED     Status: Abnormal   Collection Time: 06/07/17  9:46 AM  Result Value Ref Range   Sodium 139 135 - 145 mmol/L   Potassium 4.1 3.5 - 5.1 mmol/L   Chloride 103 101 - 111 mmol/L   BUN 21 (H) 6 - 20 mg/dL  Creatinine, Ser 0.80 0.61 - 1.24 mg/dL   Glucose, Bld 149 (H) 65 - 99 mg/dL   Calcium, Ion 1.18 1.15 - 1.40 mmol/L   TCO2 24 22 - 32 mmol/L   Hemoglobin 15.0 13.0 - 17.0 g/dL   HCT 44.0 39.0 - 52.0 %  Urine rapid drug screen (hosp performed)     Status: None   Collection Time: 06/07/17 11:37 AM  Result Value Ref Range   Opiates NONE DETECTED NONE DETECTED   Cocaine NONE DETECTED NONE DETECTED   Benzodiazepines NONE DETECTED NONE DETECTED   Amphetamines NONE DETECTED NONE DETECTED   Tetrahydrocannabinol NONE DETECTED NONE DETECTED    Barbiturates NONE DETECTED NONE DETECTED    Comment: (NOTE) DRUG SCREEN FOR MEDICAL PURPOSES ONLY.  IF CONFIRMATION IS NEEDED FOR ANY PURPOSE, NOTIFY LAB WITHIN 5 DAYS. LOWEST DETECTABLE LIMITS FOR URINE DRUG SCREEN Drug Class                     Cutoff (ng/mL) Amphetamine and metabolites    1000 Barbiturate and metabolites    200 Benzodiazepine                 161 Tricyclics and metabolites     300 Opiates and metabolites        300 Cocaine and metabolites        300 THC                            50 Performed at [facility], [place] [address], [place], [place] [address]   Urinalysis, Routine w reflex microscopic     Status: None   Collection Time: 06/07/17 11:37 AM  Result Value Ref Range   Color, Urine YELLOW YELLOW   APPearance CLEAR CLEAR   Specific Gravity, Urine 1.010 1.005 - 1.030   pH 7.0 5.0 - 8.0   Glucose, UA NEGATIVE NEGATIVE mg/dL   Hgb urine dipstick NEGATIVE NEGATIVE   Bilirubin Urine NEGATIVE NEGATIVE   Ketones, ur NEGATIVE NEGATIVE mg/dL   Protein, ur NEGATIVE NEGATIVE mg/dL   Nitrite NEGATIVE NEGATIVE   Leukocytes, UA NEGATIVE NEGATIVE    Comment: Performed at [facility] [address] [place], [place] [address]  CBG monitoring, ED     Status: Abnormal   Collection Time: 06/07/17  5:11 PM  Result Value Ref Range   Glucose-Capillary 114 (H) 65 - 99 mg/dL  Glucose, capillary     Status: Abnormal   Collection Time: 06/07/17 11:56 PM  Result Value Ref Range   Glucose-Capillary 124 (H) 65 - 99 mg/dL   Comment 1 Notify RN    Comment 2 Document in Chart    Ct Head Wo Contrast  Result Date: 06/07/2017 CLINICAL DATA:  Right-sided weakness. EXAM: CT HEAD WITHOUT CONTRAST TECHNIQUE: Contiguous axial images were obtained from the base of the skull through the vertex without intravenous contrast. COMPARISON:  None FINDINGS: Brain: No evidence of acute infarction, hemorrhage, hydrocephalus, extra-axial collection or mass  lesion/mass effect. There is mild diffuse low-attenuation within the subcortical and periventricular white matter compatible with chronic microvascular disease. Vascular: No hyperdense vessel or unexpected calcification. Skull: Normal. Negative for fracture or focal lesion. Sinuses/Orbits: Bubbly opacity within the left maxillary sinus noted. The mastoid air cells are clear. Other: None. IMPRESSION: 1. No acute intracranial abnormalities. 2. Small vessel ischemic change 3. Left maxillary sinus opacification. Electronically Signed   By: Queen Slough.D.  On: 06/07/2017 09:44   Mr Brain Wo Contrast  Result Date: 06/08/2017 CLINICAL DATA:  RIGHT extremity weakness for 4 days. Follow-up stroke. EXAM: MRI HEAD WITHOUT CONTRAST TECHNIQUE: Multiplanar, multiecho pulse sequences of the brain and surrounding structures were obtained without intravenous contrast. COMPARISON:  CT HEAD June 07, 2017 FINDINGS: INTRACRANIAL CONTENTS: 1 cm reduced diffusion LEFT thalamus versus posterior limb of external capsule with nearly normalized ADC values and FLAIR T2 hyperintense signal. No susceptibility artifact to suggest hemorrhage. Scattered patchy supratentorial and pontine white matter FLAIR T2 hyperintensities. 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 Tiny basal ganglia T2 hyperintensities associated with chronic small vessel ischemic disease. No midline shift, mass effect or masses. No abnormal extra-axial fluid collections. VASCULAR: Normal major intracranial vascular flow voids present at skull base. SKULL AND UPPER CERVICAL SPINE: No abnormal sellar expansion. No suspicious calvarial bone marrow signal. Craniocervical junction maintained. SINUSES/ORBITS: Mild paranasal sinus mucosal thickening. Trace bilateral mastoid air cell effusion.The included ocular globes and orbital contents are non-suspicious. Status post bilateral ocular lens implants. OTHER: None. IMPRESSION: 1. 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 2. 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 Electronically Signed   By: Elon Alas M.D.   On: 06/08/2017 05:01    Assessment: 77 y.o. male with new onset of right sided weakness, since Thursday 1. MRI brain reveals an early subacute LEFT thalamus versus external capsule lacunar infarct, which accounts for the patient's right sided motor symptoms. Classifiable as having failed ASA for stroke prevention.  2. Stroke Risk Factors - hypercholesterolemia, pre-diabetes 3. 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 Proprioceptive deficits bilateral toes. DDx includes large fiber neuropathy and posterior column myelopathy  Plan: 1. HgbA1c, fasting lipid panel 2. MRA of the brain without contrast 3. PT consult, OT consult, Speech consult 4. Echocardiogram 5. Carotid dopplers 6. Switch ASA to Plavix. Continue atorvastatin. 7. Risk factor modification 8. Telemetry monitoring 9. Frequent neuro checks 10. Serum B12 level 11. MRI cervical spine to assess for possible posterior column myelopathy.     _0  signed: Dr. Kerney Elbe 06/08/2017, 6:01 AM

## 2017-06-08 NOTE — Progress Notes (Signed)
Preliminary results by tech - Carotid Duplex Completed. 1-39% stenosis in bilateral internal carotid arteries. Vertebral arteries are patent with antegrade flow. Oda Cogan, BS, RDMS, RVT

## 2017-06-09 DIAGNOSIS — I6930 Unspecified sequelae of cerebral infarction: Secondary | ICD-10-CM | POA: Diagnosis not present

## 2017-06-09 DIAGNOSIS — Z7984 Long term (current) use of oral hypoglycemic drugs: Secondary | ICD-10-CM | POA: Diagnosis not present

## 2017-06-09 DIAGNOSIS — I10 Essential (primary) hypertension: Secondary | ICD-10-CM | POA: Diagnosis not present

## 2017-06-09 DIAGNOSIS — E119 Type 2 diabetes mellitus without complications: Secondary | ICD-10-CM | POA: Diagnosis not present

## 2017-06-09 DIAGNOSIS — E782 Mixed hyperlipidemia: Secondary | ICD-10-CM | POA: Diagnosis not present

## 2017-06-11 ENCOUNTER — Other Ambulatory Visit: Payer: Self-pay

## 2017-06-11 NOTE — Patient Outreach (Signed)
Patient triggered Red on [facility]i Stroke Dashboard, notification sent to Enzo Montgomery, RN

## 2017-06-11 NOTE — Patient Outreach (Signed)
[facility] Care Management  06/11/2017  Timothy Brewer 11-01-1940 [id number]     EMMI-STROKE RED ON EMMI ALERT Day # 1 Date: 06/10/17 Red Alert Reason: "Scheduled a follow up appt? No"   Outreach attempt #1 to patient.Spoke with patient who voices he is doing fairly well. He is currently in route to a senior citizens event. RN CM reviewed and addressed red alert with patient. He voices that he was unsure if machine was taking about PCP of neuro appt. Patient confirmed that he has already completed PCP appt on 06/09/17. He is awaiting neurology office to contact him for appt as advised on d/c summary. Patient states he has not heard from outpatient rehab yet. Advised patient that it may take a few days to get therapy set up. He has d/c paperwork with rehab contact info and will follow up with them if he has not heard anything soon. He voices that he has not been cleared to drive yet but has reliable person to take him to appts. He has all his meds. He denies any issues or concerns regarding them. Advised patient that they would continue to get automated EMMI-Stroke post discharge calls to assess how they are doing following recent hospitalization and will receive a call from a nurse if any of their responses were abnormal. Patient voiced understanding and was appreciative of f/u call.       Plan: RN CM will close case at this time as no further interventions needed.   Enzo Montgomery, RN,BSN,CCM [facility] Telephonic Care Management Coordinator Direct Phone: [phone] Toll Free: [phone] Fax: [phone]

## 2017-06-30 ENCOUNTER — Ambulatory Visit: Payer: PPO | Attending: Internal Medicine | Admitting: Occupational Therapy

## 2017-06-30 ENCOUNTER — Encounter: Payer: Self-pay | Admitting: Physical Therapy

## 2017-06-30 ENCOUNTER — Encounter: Payer: Self-pay | Admitting: Occupational Therapy

## 2017-06-30 ENCOUNTER — Ambulatory Visit: Payer: PPO | Admitting: Physical Therapy

## 2017-06-30 ENCOUNTER — Other Ambulatory Visit: Payer: Self-pay

## 2017-06-30 DIAGNOSIS — R2689 Other abnormalities of gait and mobility: Secondary | ICD-10-CM

## 2017-06-30 DIAGNOSIS — M6281 Muscle weakness (generalized): Secondary | ICD-10-CM | POA: Insufficient documentation

## 2017-06-30 NOTE — Therapy (Signed)
[facility] [address] [place], [place], [address] Phone: [phone]   Fax:  [phone]  Physical Therapy Evaluation  Patient Details  Name: Timothy Brewer MRN: [id number] Date of Birth: 06-27-1940 No data recorded  Encounter Date: 06/30/2017  PT End of Session - 06/30/17 1625    Visit Number  1    Number of Visits  1    PT Start Time  1532    PT Stop Time  1618    PT Time Calculation (min)  46 min    Activity Tolerance  Patient tolerated treatment well    Behavior During Therapy  [facility] for tasks assessed/performed       Past Medical History:  Diagnosis Date  . Arthritis   . Pre-diabetes    on metformin    Past Surgical History:  Procedure Laterality Date  . BACK SURGERY     c3,c4,c5,c6 ,l4,l5  . EYE SURGERY     bil cataracts  . NASAL SEPTUM SURGERY    . TONSILLECTOMY    . 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   Subjective Assessment - 06/30/17 1532    Subjective  Pt had CVA 3 weeks ago, while playing [employer], had some coordination issues with RUE with writing, R foot dragging.  MRI showed mild stroke.  Feels that the R foot dragging has resolved.  No falls.    Pertinent History  L THR 02/17/17    Patient Stated Goals  My stamina    Currently in Pain?  No/denies         [facility] PT Assessment - 06/30/17 1536      Assessment   Medical Diagnosis  Stroke    Onset Date/Surgical Date  06/04/17    Hand Dominance  Right    Next MD Visit  07/08/2017      Precautions   Precautions  -- No driving      Restrictions   Weight Bearing Restrictions  No      Balance Screen   Has the patient fallen in the past 6 months  No    Has the patient had a decrease in activity level because of a fear of  falling?   No    Is the patient reluctant to leave their home because of a fear of falling?   No      Home Environment   Living Environment  Private residence    Living Arrangements  Alone    Available Help at Discharge  -- Cleaning lady    Type of [place] to enter    Entrance Stairs-Number of Steps  13    Entrance Stairs-Rails  Right    Home Layout  One level      Prior Function   Level of Independence  Independent;Independent with basic ADLs;Independent with household mobility without device;Independent with community mobility without device;Independent with homemaking with ambulation    Vocation  Retired    Nurse, adult     Leisure  Exercise, Reading, walking, time with friends, grandfather      Posture/Postural Control   Posture/Postural Control  No significant limitations      ROM / Strength   AROM / PROM / Strength  Strength      Strength   Overall Strength Comments  Grossly tested at least 4+/5 bilateral lower extremities      Transfers   Transfers  Sit to Stand;Stand to Sit    Sit to Stand  6: Modified independent (Device/Increase time);From chair/3-in-1;With upper extremity assist    Stand to Sit  6: Modified independent (Device/Increase time);To chair/3-in-1;With upper extremity assist      Ambulation/Gait   Ambulation/Gait  Yes    Ambulation/Gait Assistance  7: Independent    Ambulation Distance (Feet)  200 Feet    Assistive device  None    Gait Pattern  Step-through pattern    Ambulation Surface  Level;Indoor    Gait velocity  10 sec = 3.28 ft/sec      Standardized Balance Assessment   Standardized Balance Assessment  Timed Up and Go Test    Balance Master Testing  Sensory Organization Test      Timed Up and Go Test   Normal TUG (seconds)  10.43    TUG Comments  Scores >13.5 seconds indicate increased fall risk.      Balance Master Testing    Results  Composite balance score 69/100; Sensory Analysis:   somatosensory, vision, vestibular system all WNL      Functional Gait  Assessment   Gait assessed   Yes    Gait Level Surface  Walks 20 ft in less than 7 sec but greater than 5.5 sec, uses assistive device, slower speed, mild gait deviations, or deviates 6-10 in outside of the 12 in walkway width.    Change in Gait Speed  Able to smoothly change walking speed without loss of balance or gait deviation. Deviate no more than 6 in outside of the 12 in walkway width.    Gait with Horizontal Head Turns  Performs head turns smoothly with no change in gait. Deviates no more than 6 in outside 12 in walkway width    Gait with Vertical Head Turns  Performs head turns with no change in gait. Deviates no more than 6 in outside 12 in walkway width.    Gait and Pivot Turn  Pivot turns safely within 3 sec and stops quickly with no loss of balance.    Step Over Obstacle  Is able to step over one shoe box (4.5 in total height) without changing gait speed. No evidence of imbalance.    Gait with Narrow Base of Support  Ambulates 4-7 steps.    Gait with Eyes Closed  Cannot walk 20 ft without assistance, severe gait deviations or imbalance, deviates greater than 15 in outside 12 in walkway width or will not attempt task.    Ambulating Backwards  Walks 20 ft, uses assistive device, slower speed, mild gait deviations, deviates 6-10 in outside 12 in walkway width.    Steps  Alternating feet, must use rail.    Total Score  21    FGA comment:  Scores <22/30 indicates increased fall risk.                Objective measurements completed on examination: See above findings.              PT Education - 06/30/17 1628    Education provided  Yes    Education Details  Educated in objective measures, situations that may create difficulty with balance based on veering to R with gait with EC.  Discussed gradual progression back to walking in neighborhood for exercise.    Person(s)  Educated  Patient    Methods   Explanation    Comprehension  Verbalized understanding                  Plan - 06/30/17 1625    Clinical Impression Statement  Pt is a 77 year old male who presents to OP PT with history of CVA 06/04/17, with R sided weakness, which reportedly has resolved.  He has history of L TKR 02/17/17, which he had returned to independent ambulation prior to CVA.  Pt feels he is 96% back to normal, and pt is WNL with gait velocity, TUG scores, Sensory Organization test scores.  He is at slight fall risk per 21/30 FGA score, but feels this is back to baseline.  Educated patient in compensations for lowly lit situations.  Pt does not feel he needs PT at this time.    Clinical Presentation  Stable    Clinical Decision Making  Low    PT Frequency  One time visit    PT Next Visit Plan  Eval only-pt feels he is so close to baseline that he does not need additional PT.    Consulted and Agree with Plan of Care  Patient       Patient will benefit from skilled therapeutic intervention in order to improve the following deficits and impairments:     Visit Diagnosis: Other abnormalities of gait and mobility     Problem List Patient Active Problem List   Diagnosis Date Noted  . CVA (cerebral vascular accident) ([place]) 06/07/2017  . Overweight (BMI 25.0-29.9) 02/18/2017  . 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 Plantar fasciitis 06/09/2016    Frazier Butt. 06/30/2017, 4:30 PM Frazier Butt., PT  [facility] [address] [place], [place], [address] Phone: [phone]   Fax:  [phone]  Name: Timothy Brewer MRN: [id number] Date of Birth: 05/12/1940

## 2017-06-30 NOTE — Therapy (Signed)
[facility] [address] [place], [place], [address] Phone: [phone]   Fax:  [phone]  Occupational Therapy Evaluation  Patient Details  Name: Timothy Brewer MRN: [id number] Date of Birth: Mar 07, 1941 Referring Provider: Carney Harder A (Will send documentation to Neurologist- Antony Contras)   Encounter Date: 06/30/2017  OT End of Session - 06/30/17 1705    Visit Number  1    Number of Visits  1       Past Medical History:  Diagnosis Date  . Arthritis   . Pre-diabetes    on metformin    Past Surgical History:  Procedure Laterality Date  . BACK SURGERY     c3,c4,c5,c6 ,l4,l5  . EYE SURGERY     bil cataracts  . NASAL SEPTUM SURGERY    . TONSILLECTOMY    . 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  Subjective Assessment - 06/30/17 1457    Subjective   I feel like I am about 90-95% of where I was before the stroke.      Currently in Pain?  No/denies        [facility] OT Assessment - 06/30/17 0001      Assessment   Medical Diagnosis  Stroke    Referring Provider  Carney Harder A Will send documentation to Neurologist- Antony Contras    Onset Date/Surgical Date  06/04/17    Hand Dominance  Right    Next MD Visit  07/08/2017      Precautions   Precaution Comments  "No driving until cleared by MD"      Restrictions   Weight Bearing Restrictions  No      Balance Screen   Has the patient fallen in the past 6 months  No      Prior Function   Level of Independence  Independent;Independent with basic ADLs;Independent with household mobility without device;Independent with community mobility without device;Independent with homemaking with ambulation    Vocation  Retired    Management consultant     Leisure  Exercise, Reading, walking, time with friends, grandfather      ADL   Eating/Feeding  Independent    Grooming  Independent    Environmental health practitioner  Independent    ADL comments  Patient instructed not to drive, however admitted to driving short distances out of necessity.  Patient has no obvious visual, cognitive, or motor impairments that might impede his ability to safely drive per gross assessment.        IADL   Prior Level of Function Shopping  Independent    Shopping  Takes care of all shopping needs independently    Prior Level of Function Light Housekeeping  Independent    Light Housekeeping  Does personal laundry completely    Prior Level of Function Meal Prep  Independent    Meal Prep  Plans, prepares and serves adequate meals independently    Prior Level of Function Sales executive  Drives own vehicle    Prior  Level of Function Medication Managment  Independent    Medication Management  Is responsible for taking medication in correct dosages at correct time    Prior Level of Function Financial Management  Independent    Financial Management  Manages financial matters independently (budgets, writes checks, pays rent, bills goes to bank), collects and keeps track of income      Written Expression   Dominant Hand  Right    Handwriting  100% legible    Written Experience  Within Functional Limits      Vision - History   Baseline Vision  No visual deficits    Visual History  Cataracts    Additional Comments  Lens implants one distance and one close      Vision Assessment   Eye Alignment  Within Functional Limits    Ocular Range of Motion  Within Functional Limits    Alignment/Gaze Preference  Within Defined Limits    Tracking/Visual Pursuits   Able to track stimulus in all quads without difficulty    Saccades  Within functional limits    Comment  No visual deficits since stroke      Activity Tolerance   Activity Tolerance  Endurance does not limit participation in activity    Activity Tolerance Comments  I don't have the stamina I once had      Cognition   Overall Cognitive Status  Within Functional Limits for tasks assessed      Posture/Postural Control   Posture/Postural Control  No significant limitations      Sensation   Light Touch  Appears Intact      Coordination   Gross Motor Movements are Fluid and Coordinated  Yes    Fine Motor Movements are Fluid and Coordinated  Yes    9 Hole Peg Test  Right;Left    Right 9 Hole Peg Test  22.38    Left 9 Hole Peg Test  20.44      Perception   Perception  Within Functional Limits      Praxis   Praxis  Intact      Strength   Overall Strength Comments  Gross assessment is 4+/5 throughout UE's Bilaterally      Hand Function   Right Hand Gross Grasp  Functional    Right Hand Grip (lbs)  65    Right Hand Lateral Pinch  19 lbs    Right Hand 3 Point Pinch  19 lbs    Left Hand Gross Grasp  Functional    Left Hand Grip (lbs)  64    Left Hand Lateral Pinch  19 lbs    Left 3 point pinch  19 lbs                      OT Education - 06/30/17 1705    Education provided  Yes    Education Details  Evaluation findings and no apparent need for further OT services    Person(s) Educated  Patient    Methods  Explanation    Comprehension  Verbalized understanding                 Plan - 06/30/17 1705    Clinical Impression Statement  Patient is a 77 year old man after recent overnight hospitalization on 06/07/17 for stroke. Patient with initial report of decreased awareness of right foot during gait, and decreased coordiantion in hand with activities like typing and handwriting.  These issues have seemed to resolve and  patient is functioning independently  in his home.      Occupational Profile and client history currently impacting functional performance  Retired Programme researcher, broadcasting/film/video, father, grandfather, friend    Plan  No further OT warranted       Patient will benefit from skilled therapeutic intervention in order to improve the following deficits and impairments:     Visit Diagnosis: Muscle weakness (generalized) - Plan: Ot plan of care cert/re-cert    Problem List Patient Active Problem List   Diagnosis Date Noted  . CVA (cerebral vascular accident) ([place]) 06/07/2017  . Overweight (BMI 25.0-29.9) 02/18/2017  . 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 Plantar fasciitis 06/09/2016    Mariah Milling, OTR/L 06/30/2017, 5:20 PM  [facility] [address] [place], [place], [address] Phone: [phone]   Fax:  [phone]  Name: Timothy Brewer MRN: [id number] Date of Birth: 07/09/1940

## 2017-07-06 DIAGNOSIS — L508 Other urticaria: Secondary | ICD-10-CM | POA: Diagnosis not present

## 2017-07-08 ENCOUNTER — Ambulatory Visit (INDEPENDENT_AMBULATORY_CARE_PROVIDER_SITE_OTHER): Payer: PPO | Admitting: Adult Health

## 2017-07-08 ENCOUNTER — Encounter: Payer: Self-pay | Admitting: Adult Health

## 2017-07-08 VITALS — BP 106/58 | HR 104 | Ht 67.0 in | Wt 172.8 lb

## 2017-07-08 DIAGNOSIS — E785 Hyperlipidemia, unspecified: Secondary | ICD-10-CM

## 2017-07-08 DIAGNOSIS — I639 Cerebral infarction, unspecified: Secondary | ICD-10-CM | POA: Diagnosis not present

## 2017-07-08 DIAGNOSIS — I6381 Other cerebral infarction due to occlusion or stenosis of small artery: Secondary | ICD-10-CM

## 2017-07-08 MED ORDER — ASPIRIN EC 325 MG PO TBEC: 325.0000 mg | DELAYED_RELEASE_TABLET | Freq: Every day | ORAL | 0 refills | Status: DC

## 2017-07-08 NOTE — Patient Instructions (Addendum)
Start aspirin 325 mg daily  and lipitor  for secondary stroke prevention  continue to follow up with PCP regarding cholesterol, blood pressure and hives  Continue to monitor blood pressure at home  Continue prednisone for hives  You are clear to drive. We recommend that you ride with a family member or friend for the first could of times and once they feel comfortable with you driving, please drive to places you are familiar with only, avoid high traffic areas and drive during day time hours.    Maintain strict control of hypertension with blood pressure goal below 130/90, diabetes with hemoglobin A1c goal below 6.5% and cholesterol with LDL cholesterol (bad cholesterol) goal below 70 mg/dL. I also advised the patient to eat a healthy diet with plenty of whole grains, cereals, fruits and vegetables, exercise regularly and maintain ideal body weight.  Followup in the future with me in 3 months or call earlier if needed

## 2017-07-08 NOTE — Progress Notes (Signed)
I agree with the above plan 

## 2017-07-08 NOTE — Progress Notes (Signed)
[facility] [address] [place]. [place] [address] [phone]       OFFICE FOLLOW UP NOTE  [name] Date of Birth:  12-24-1940 Medical Record Number:  [id number]   Reason for Referral:  hospital stroke follow up  CHIEF COMPLAINT:  Chief Complaint  Patient presents with  . Follow-up    Stroke follow up pt seen by Dr. Leonie Man in hospital     HPI: Timothy Brewer is being seen today for initial visit in the office for left thalamic stroke on 06/07/17. History obtained from patient, daughter and chart review. Reviewed all radiology images and labs personally.  Timothy Brewer is a 77 y.o. male with history of HLD and pre-DB presenting with R sided weakness for past 3 days. He did not receive IV t-PA due to delay in arrival. CT head reviewed and showed no acute hemorrhage. MRI brain reviewed and showed small left thalamic lacune and old right thalamic lacune secondary to small vessel disease. Carotid doppler showed bilateral stenosis of 1-39%. 2D echo showed EF 60-65% without source of embolus. Transcranial doppler showed diffuse intracranial atherosclerosis. Patient was previously on aspirin 81mg  PTA and recommended DAPT for 3 weeks and then continue Plavix alone. LDL 100 and recommended lipitor 20mg  daily (previously lipitor 20 on Sun and Wed only). Patient discharged home in stable condition.   Since discharge, patient has been doing well and is accompanied at today's appointment with his daughter.  He continued to take Plavix and aspirin for 3 weeks total without side effects of bleeding or bruising and continued on Plavix only on Sunday.  Monday morning, patient awoke with a large, raised hives in bilateral upper and lower extremities, groin area, chest and abdomen.  Patient was told by PCP to stop Plavix, aspirin and valsartan.  He was placed on prednisone taper but patient has not seen significant difference in his hives at this time.  Denies new  medications, laundry detergent, or body soap.  Denies difficulty breathing or swallowing.  He did state that on Wednesday Thursday and Friday of last week he took Airborne cold medicine but he has taken this before and tolerated it well.  He will be following up with PCP on Friday.  He does continue to take Lipitor without side effects or myalgias.  He did attend one appointment with both PT/OT where he was released after first appointment.  Blood pressure today satisfactory at 106/58.  Patient returned to all previous activities without restrictions.  Patient is inquiring whether he would be able to drive at this time.  Denies new or worsening stroke/TIA symptoms.   ROS:   14 system review of systems performed and negative with exception of ringing in ears, drooling, cough, wheezing, shortness of breath, chest tightness, rash and itching  PMH:  Past Medical History:  Diagnosis Date  . Arthritis   . Diabetes mellitus without complication ([place])   . Hypertension   . Pre-diabetes    on metformin  . Stroke [facility])     PSH:  Past Surgical History:  Procedure Laterality Date  . BACK SURGERY     c3,c4,c5,c6 ,l4,l5  . EYE SURGERY     bil cataracts  . NASAL SEPTUM SURGERY    . TONSILLECTOMY    . 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   Procedure: LEFT TOTAL HIP ARTHROPLASTY ANTERIOR APPROACH;  Surgeon: Paralee Cancel, MD;  Location: [facility];  Service: Orthopedics;  Laterality: Left;  70 mins  Social History:  Social History   Socioeconomic History  . Marital status: Widowed    Spouse name: Not on file  . Number of children: Not on file  . Years of education: Not on file  . Highest education level: Not on file  Occupational History  . Not on file  Social Needs  . Financial resource strain: Not on file  . Food insecurity:    Worry: Not on file    Inability: Not on file  . Transportation needs:    Medical: Not on file    Non-medical: Not on file  Tobacco Use  . Smoking status:  Former Smoker    Types: Cigarettes    Last attempt to quit: 07/09/2014    Years since quitting: 3.0  . Smokeless tobacco: Never Used  . Tobacco comment: 8-10 a day  Substance and Sexual Activity  . Alcohol use: Yes    Alcohol/week: 0.6 oz    Types: 1 Glasses of wine per week    Comment: socially  . Drug use: No  . Sexual activity: Yes  Lifestyle  . Physical activity:    Days per week: Not on file    Minutes per session: Not on file  . Stress: Not on file  Relationships  . Social connections:    Talks on phone: Not on file    Gets together: Not on file    Attends religious service: Not on file    Active member of club or organization: Not on file    Attends meetings of clubs or organizations: Not on file    Relationship status: Not on file  . Intimate partner violence:    Fear of current or ex partner: Not on file    Emotionally abused: Not on file    Physically abused: Not on file    Forced sexual activity: Not on file  Other Topics Concern  . Not on file  Social History Narrative  . Not on file    Family History:  Family History  Problem Relation Age of Onset  . Stroke Maternal Grandmother     Medications:   Current Outpatient Medications on File Prior to Visit  Medication Sig Dispense Refill  . atorvastatin (LIPITOR) 20 MG tablet Take 1 tablet (20 mg total) by mouth daily at 6 PM. 30 tablet 0  . Calcium Carb-Cholecalciferol (CALCIUM 600 + D PO) Take by mouth.    . Calcium Polycarbophil (FIBER-CAPS PO) Take 1 capsule by mouth 2 (two) times daily.     . cetirizine (KLS ALLER-TEC) 10 MG tablet Take 10 mg by mouth daily.    . cholecalciferol (VITAMIN D) 1000 units tablet Take 2,000 Units by mouth at bedtime.     . clobetasol cream (TEMOVATE) 7.34 % Apply 1 application topically daily.    . ferrous sulfate (FERROUSUL) 325 (65 FE) MG tablet Take 1 tablet (325 mg total) by mouth 3 (three) times daily with meals.  3  . fexofenadine (ALLEGRA) 180 MG tablet Take 180 mg by  mouth every evening.     . fluticasone (CUTIVATE) 0.05 % cream Apply topically 2 (two) times daily.    . metFORMIN (GLUCOPHAGE-XR) 500 MG 24 hr tablet Take 500 mg by mouth 2 (two) times daily.     . Multiple Vitamin (MULTIVITAMIN) tablet Take 1 tablet by mouth daily.    . Multiple Vitamins-Minerals (RA VISION-VITE PRESERVE PO) Take by mouth 2 (two) times daily.    . polyethylene glycol (MIRALAX / GLYCOLAX) packet Take  17 g by mouth 2 (two) times daily. (Patient taking differently: Take 17 g by mouth daily as needed for mild constipation. ) 14 each 0  . predniSONE (DELTASONE) 20 MG tablet   0  . valsartan (DIOVAN) 80 MG tablet      No current facility-administered medications on file prior to visit.     Allergies:   Allergies  Allergen Reactions  . Iodine Hives and Other (See Comments)    Internal only     Physical Exam  Vitals:   07/08/17 1303  BP: (!) 106/58  Pulse: (!) 104  Weight: 172 lb 12.8 oz (78.4 kg)  Height: 5\' 7"  (1.702 m)   Body mass index is 27.06 kg/m. No exam data present  General: well developed, well nourished, seated, in no evident distress Head: head normocephalic and atraumatic.   Neck: supple with no carotid or supraclavicular bruits Cardiovascular: regular rate and rhythm, no murmurs Musculoskeletal: no deformity Skin: Large, red, raised hives spread throughout body Vascular:  Normal pulses all extremities  Neurologic Exam Mental Status: Awake and fully alert. Oriented to place and time. Recent and remote memory intact. Attention span, concentration and fund of knowledge appropriate. Mood and affect appropriate.  Cranial Nerves: Fundoscopic exam reveals sharp disc margins. Pupils equal, briskly reactive to light. Extraocular movements full without nystagmus. Visual fields full to confrontation. Hearing intact. Facial sensation intact. Face, tongue, palate moves normally and symmetrically.  Motor: Normal bulk and tone. Normal strength in all tested  extremity muscles. Sensory.: intact to touch , pinprick , position and vibratory sensation.  Coordination: Rapid alternating movements normal in all extremities. Finger-to-nose and heel-to-shin performed accurately bilaterally. Gait and Station: Arises from chair without difficulty. Stance is normal. Gait demonstrates normal stride length and balance . Able to heel, toe and tandem walk without difficulty.  Reflexes: 1+ and symmetric. Toes downgoing.    NIHSS  0 Modified Rankin  0    Diagnostic Data (Labs, Imaging, Testing)  CT HEAD WO CONTRAST 06/07/17 IMPRESSION: 1. No acute intracranial abnormalities. 2. Small vessel ischemic change 3. Left maxillary sinus opacification.  MR BRAIN WO CONTRAST 06/08/17 IMPRESSION: 1. 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 2. 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  2D ECHOCARDIOGRAM 06/07/17 Study Conclusions - Procedure narrative: Transthoracic echocardiography. Image   quality was poor. The study was technically difficult, as a   result of poor acoustic windows and poor sound wave transmission. - Left ventricle: The cavity size was normal. Wall thickness was   increased in a pattern of mild LVH. Systolic function was normal.   The estimated ejection fraction was in the range of 60% to 65%.   Wall motion was normal; there were no regional wall motion   abnormalities. Doppler parameters are consistent with abnormal   left ventricular relaxation (grade 1 diastolic dysfunction). The   E/e&' ratio is <8, suggesting normal LV filling pressure. - Mitral valve: Mildly thickened leaflets . There was trivial   regurgitation. - Left atrium: The atrium was normal in size. - Inferior vena cava: The vessel was normal in size. The   respirophasic diameter changes were in the normal range (>= 50%),   consistent with normal central venous pressure. Impressions: - Technically difficult  study. LVEF 60-65%, mild LVH, normal wall   motion, grade 1 DD, normal LV filling pressure, trivial MR,   normal LA size, normal IVC.  US CAROTID DUPLEX BILATERAL 06/08/17 Final Interpretation: Right Carotid: Velocities  in the right ICA are consistent with a 1-39% stenosis. Left Carotid: Velocities in the left ICA are consistent with a 1-39% stenosis. Vertebrals: Bilateral vertebral arteries demonstrate antegrade flow.  [place] TRANSCRANIAL DOPPLER 06/08/17 Final Interpretation: Poor orbital windows limits evaluation of ophthalmic and terminal carotid arteries. Normal mean flow velocities in majority of identified vessels of anterior and posterior cerebral circulation. Globally elevated pulsatility indexes suggest diffuse  intracranial atherosclerosis likely    ASSESSMENT: Timothy Brewer is a 77 y.o. 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 Vascular risk factors include HLD, HTN and pre-DM.  Possible medication reaction to Plavix and patient was restarted on aspirin 325.  Patient recovered well without residual symptoms.    PLAN: -Recommended to start aspirin 325 mg daily  and continue Lipitor for secondary stroke prevention -F/u with PCP regarding your HLD, blood pressure and hives management -at this time, patient's blood pressure log and blood pressure at today's appointment satisfactory and may not need to return on blood pressure medication -Hives started approximately 3 weeks after starting Plavix, aspirin and valsartan.  It is difficult to say what medication caused reaction as he was told to stop all 3.  Hive reaction does correlate to a medication reaction.  Patient will continue aspirin as he was on this prior to recent stroke.  We will hold off on restarting Plavix at this time -continue to monitor BP at home -Patient has been cleared to drive and recommended that family member lives with him for the first few times until they give him the okay and at  that time he will drive only to places he is familiar with, low traffic areas and during daylight hours only.  Patient verbalized understanding. -Maintain strict control of hypertension with blood pressure goal below 130/90, diabetes with hemoglobin A1c goal below 6.5% and cholesterol with LDL cholesterol (bad cholesterol) goal below 70 mg/dL. I also advised the patient to eat a healthy diet with plenty of whole grains, cereals, fruits and vegetables, exercise regularly and maintain ideal body weight.  Follow up in 3 months or call earlier if needed   Greater than 50% time during this 25 minute consultation visit was spent on counseling and coordination of care about HLD, discussion about risk benefit of anticoagulation and answering questions.    Venancio Poisson, AGNP-BC  [facility] [address] [place], [place] [address]  Phone [phone] Fax [phone]

## 2017-07-09 ENCOUNTER — Other Ambulatory Visit: Payer: Self-pay | Admitting: Family Medicine

## 2017-07-09 ENCOUNTER — Ambulatory Visit
Admission: RE | Admit: 2017-07-09 | Discharge: 2017-07-09 | Disposition: A | Discharge status: Home / Self Care | Payer: PPO | Source: Ambulatory Visit | Attending: Family Medicine | Admitting: Family Medicine

## 2017-07-09 DIAGNOSIS — I6930 Unspecified sequelae of cerebral infarction: Secondary | ICD-10-CM | POA: Diagnosis not present

## 2017-07-09 DIAGNOSIS — R062 Wheezing: Secondary | ICD-10-CM

## 2017-07-09 DIAGNOSIS — Z7984 Long term (current) use of oral hypoglycemic drugs: Secondary | ICD-10-CM | POA: Diagnosis not present

## 2017-07-09 DIAGNOSIS — L509 Urticaria, unspecified: Secondary | ICD-10-CM | POA: Diagnosis not present

## 2017-07-09 DIAGNOSIS — I10 Essential (primary) hypertension: Secondary | ICD-10-CM | POA: Diagnosis not present

## 2017-07-09 DIAGNOSIS — E1169 Type 2 diabetes mellitus with other specified complication: Secondary | ICD-10-CM | POA: Diagnosis not present

## 2017-07-09 DIAGNOSIS — E782 Mixed hyperlipidemia: Secondary | ICD-10-CM | POA: Diagnosis not present

## 2017-07-09 DIAGNOSIS — J439 Emphysema, unspecified: Secondary | ICD-10-CM | POA: Diagnosis not present

## 2017-07-10 ENCOUNTER — Encounter (HOSPITAL_COMMUNITY): Payer: Self-pay | Admitting: Emergency Medicine

## 2017-07-10 ENCOUNTER — Emergency Department (HOSPITAL_COMMUNITY)
Admission: EM | Admit: 2017-07-10 | Discharge: 2017-07-11 | Disposition: A | Discharge status: Home / Self Care | Payer: PPO | Attending: Emergency Medicine | Admitting: Emergency Medicine

## 2017-07-10 ENCOUNTER — Other Ambulatory Visit: Payer: Self-pay

## 2017-07-10 DIAGNOSIS — Z7982 Long term (current) use of aspirin: Secondary | ICD-10-CM | POA: Diagnosis not present

## 2017-07-10 DIAGNOSIS — T887XXA Unspecified adverse effect of drug or medicament, initial encounter: Secondary | ICD-10-CM | POA: Insufficient documentation

## 2017-07-10 DIAGNOSIS — Y829 Unspecified medical devices associated with adverse incidents: Secondary | ICD-10-CM | POA: Diagnosis not present

## 2017-07-10 DIAGNOSIS — I10 Essential (primary) hypertension: Secondary | ICD-10-CM | POA: Diagnosis not present

## 2017-07-10 DIAGNOSIS — T45525A Adverse effect of antithrombotic drugs, initial encounter: Secondary | ICD-10-CM | POA: Insufficient documentation

## 2017-07-10 DIAGNOSIS — T50905A Adverse effect of unspecified drugs, medicaments and biological substances, initial encounter: Secondary | ICD-10-CM

## 2017-07-10 DIAGNOSIS — E119 Type 2 diabetes mellitus without complications: Secondary | ICD-10-CM | POA: Insufficient documentation

## 2017-07-10 DIAGNOSIS — T380X1A Poisoning by glucocorticoids and synthetic analogues, accidental (unintentional), initial encounter: Secondary | ICD-10-CM | POA: Diagnosis not present

## 2017-07-10 DIAGNOSIS — M0230 Reiter's disease, unspecified site: Secondary | ICD-10-CM | POA: Diagnosis not present

## 2017-07-10 DIAGNOSIS — Z87891 Personal history of nicotine dependence: Secondary | ICD-10-CM | POA: Diagnosis not present

## 2017-07-10 DIAGNOSIS — M02839 Other reactive arthropathies, unspecified wrist: Secondary | ICD-10-CM | POA: Diagnosis not present

## 2017-07-10 DIAGNOSIS — Z8673 Personal history of transient ischemic attack (TIA), and cerebral infarction without residual deficits: Secondary | ICD-10-CM | POA: Insufficient documentation

## 2017-07-10 DIAGNOSIS — R21 Rash and other nonspecific skin eruption: Secondary | ICD-10-CM | POA: Diagnosis present

## 2017-07-10 DIAGNOSIS — Z96642 Presence of left artificial hip joint: Secondary | ICD-10-CM | POA: Insufficient documentation

## 2017-07-10 DIAGNOSIS — Z79899 Other long term (current) drug therapy: Secondary | ICD-10-CM | POA: Diagnosis not present

## 2017-07-10 LAB — CBC WITH DIFFERENTIAL/PLATELET
Basophils Absolute: 0 10*3/uL (ref 0.0–0.1)
Basophils Relative: 0 %
EOS ABS: 0 10*3/uL (ref 0.0–0.7)
EOS PCT: 0 %
HCT: 39.3 % (ref 39.0–52.0)
Hemoglobin: 13.6 g/dL (ref 13.0–17.0)
Lymphocytes Relative: 14 %
Lymphs Abs: 1.8 10*3/uL (ref 0.7–4.0)
MCH: 30.8 pg (ref 26.0–34.0)
MCHC: 34.6 g/dL (ref 30.0–36.0)
MCV: 88.9 fL (ref 78.0–100.0)
MONO ABS: 1 10*3/uL (ref 0.1–1.0)
MONOS PCT: 8 %
Neutro Abs: 9.8 10*3/uL — ABNORMAL HIGH (ref 1.7–7.7)
Neutrophils Relative %: 78 %
Platelets: 303 10*3/uL (ref 150–400)
RBC: 4.42 MIL/uL (ref 4.22–5.81)
RDW: 15.6 % — AB (ref 11.5–15.5)
WBC: 12.7 10*3/uL — ABNORMAL HIGH (ref 4.0–10.5)

## 2017-07-10 MED ORDER — MORPHINE SULFATE (PF) 4 MG/ML IV SOLN
4.0000 mg | INTRAVENOUS | Status: DC | PRN
  Administered 2017-07-10: 4 mg via INTRAVENOUS
  Filled 2017-07-10: qty 1

## 2017-07-10 MED ORDER — ONDANSETRON HCL 4 MG/2ML IJ SOLN
4.0000 mg | Freq: Once | INTRAMUSCULAR | Status: AC
  Administered 2017-07-10: 4 mg via INTRAVENOUS
  Filled 2017-07-10: qty 2

## 2017-07-10 MED ORDER — FAMOTIDINE IN NACL 20-0.9 MG/50ML-% IV SOLN
20.0000 mg | Freq: Two times a day (BID) | INTRAVENOUS | Status: DC
  Administered 2017-07-10: 20 mg via INTRAVENOUS
  Filled 2017-07-10: qty 50

## 2017-07-10 NOTE — ED Triage Notes (Signed)
Patient complaining of a rash he has had for about 3 weeks. Patient has been on steriods and has been seen by md. Patient states tonight he felt some difficulty swallowing his food at dinner. Patient is breathing ok.

## 2017-07-11 LAB — BASIC METABOLIC PANEL
Anion gap: 14 (ref 5–15)
BUN: 28 mg/dL — ABNORMAL HIGH (ref 6–20)
CHLORIDE: 106 mmol/L (ref 101–111)
CO2: 20 mmol/L — AB (ref 22–32)
Calcium: 9 mg/dL (ref 8.9–10.3)
Creatinine, Ser: 0.94 mg/dL (ref 0.61–1.24)
GFR calc non Af Amer: >60 mL/min (ref >60)
Glucose, Bld: 104 mg/dL — ABNORMAL HIGH (ref 65–99)
Potassium: 4.2 mmol/L (ref 3.5–5.1)
Sodium: 140 mmol/L (ref 135–145)

## 2017-07-11 LAB — SEDIMENTATION RATE: SED RATE: 49 mm/h — AB (ref 0–16)

## 2017-07-11 LAB — CK: CK TOTAL: 33 U/L — AB (ref 49–397)

## 2017-07-11 MED ORDER — OMEPRAZOLE 20 MG PO CPDR: 20.0000 mg | DELAYED_RELEASE_CAPSULE | Freq: Two times a day (BID) | ORAL | 1 refills | Status: DC

## 2017-07-11 MED ORDER — NAPROXEN 500 MG PO TABS: 500.0000 mg | ORAL_TABLET | Freq: Two times a day (BID) | ORAL | 0 refills | Status: DC

## 2017-07-11 MED ORDER — KETOROLAC TROMETHAMINE 30 MG/ML IJ SOLN
30.0000 mg | Freq: Once | INTRAMUSCULAR | Status: AC
  Administered 2017-07-11: 30 mg via INTRAVENOUS
  Filled 2017-07-11: qty 1

## 2017-07-11 NOTE — Discharge Instructions (Addendum)
Finish prednisone as prescribed. Prilosec twice per day for gastritis/reflux. Start naproxen upon completion of prednisone if your joint pain persists.

## 2017-07-11 NOTE — ED Provider Notes (Signed)
[facility] DEPT Provider Note   CSN: [id number] Arrival date & time: 07/10/17  2104     History   Chief Complaint Chief Complaint  Patient presents with  . Rash  . Hypertension    HPI Timothy Brewer is a 77 y.o. male.  Chief complaint is joint pain, rash, medication reaction  HPI 77 year old male.  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  Hospitalized overnight.  Discharged on valsartan, and Plavix.  Developed a rash and some joint pain.  Rash for 2 weeks.  Developed joint pain just yesterday.  He was placed on prednisone tapering dose over 9 days starting 4 days ago.  His rash is improved.  His wrist pain has worsened.  He was stopped from taking the valsartan, and Plavix and continues on aspirin.  Past Medical History:  Diagnosis Date  . Arthritis   . Diabetes mellitus without complication ([place])   . Hypertension   . Pre-diabetes    on metformin  . Stroke [facility])     Patient Active Problem List   Diagnosis Date Noted  . CVA (cerebral vascular accident) ([place]) 06/07/2017  . Overweight (BMI 25.0-29.9) 02/18/2017  . 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 Plantar fasciitis 06/09/2016    Past Surgical History:  Procedure Laterality Date  . BACK SURGERY     c3,c4,c5,c6 ,l4,l5  . EYE SURGERY     bil cataracts  . NASAL SEPTUM SURGERY    . TONSILLECTOMY    . 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   Procedure: LEFT TOTAL HIP ARTHROPLASTY ANTERIOR APPROACH;  Surgeon: Paralee Cancel, MD;  Location: [facility] ORS;  Service: Orthopedics;  Laterality: Left;  70 mins        Home Medications    Prior to Admission medications   Medication Sig Start Date End Date Taking? Authorizing Provider  aspirin EC 325 MG tablet Take 1 tablet (325 mg total) by mouth daily. 07/08/17  Yes Venancio Poisson, NP  atorvastatin (LIPITOR) 20 MG tablet Take 1 tablet (20 mg total) by mouth daily at 6 PM. 06/08/17  Yes Regalado, Belkys A, MD  Calcium Carb-Cholecalciferol (CALCIUM 600 + D PO) Take 1  tablet by mouth daily.    Yes [provider]  Calcium Polycarbophil (FIBER-CAPS PO) Take 1 capsule by mouth 2 (two) times daily.    Yes [provider]  cetirizine (KLS ALLER-TEC) 10 MG tablet Take 10 mg by mouth daily.   Yes [provider]  cholecalciferol (VITAMIN D) 1000 units tablet Take 2,000 Units by mouth at bedtime.    Yes [provider]  fexofenadine (ALLEGRA) 180 MG tablet Take 180 mg by mouth every evening.    Yes [provider]  fluticasone (CUTIVATE) 0.05 % cream Apply 1 application topically 2 (two) times daily as needed (irritation).    Yes [provider]  metFORMIN (GLUCOPHAGE-XR) 500 MG 24 hr tablet Take 500 mg by mouth 2 (two) times daily.  04/18/16  Yes [provider]  Multiple Vitamin (MULTIVITAMIN) tablet Take 1 tablet by mouth daily.   Yes [provider]  Multiple Vitamins-Minerals (RA VISION-VITE PRESERVE PO) Take 1 tablet by mouth 2 (two) times daily.    Yes [provider]  polyethylene glycol (MIRALAX / GLYCOLAX) packet Take 17 g by mouth 2 (two) times daily. Patient taking differently: Take 17 g by mouth daily as needed for mild constipation.  02/17/17  Yes Babish, Rodman Key, PA-C  predniSONE (DELTASONE) 20 MG tablet Take 20 mg by mouth  See admin instructions. 40 mg 05/04, 05/05, then 20 mg x 3 days 07/06/17  Yes [provider]  ferrous sulfate (FERROUSUL) 325 (65 FE) MG tablet Take 1 tablet (325 mg total) by mouth 3 (three) times daily with meals. Patient not taking: Reported on 07/11/2017 02/17/17   Danae Orleans, PA-C  naproxen (NAPROSYN) 500 MG tablet Take 1 tablet (500 mg total) by mouth 2 (two) times daily. 07/11/17   Tanna Furry, MD  omeprazole (PRILOSEC) 20 MG capsule Take 1 capsule (20 mg total) by mouth 2 (two) times daily. 07/11/17   Tanna Furry, MD    Family History Family History  Problem Relation Age of Onset  . Stroke Maternal Grandmother     Social  History Social History   Tobacco Use  . Smoking status: Former Smoker    Types: Cigarettes    Last attempt to quit: 07/09/2014    Years since quitting: 3.0  . Smokeless tobacco: Never Used  . Tobacco comment: 8-10 a day  Substance Use Topics  . Alcohol use: Yes    Alcohol/week: 0.6 oz    Types: 1 Glasses of wine per week    Comment: socially  . Drug use: No     Allergies   Iodine   Review of Systems Review of Systems  Constitutional: Negative for appetite change, chills, diaphoresis, fatigue and fever.  HENT: Negative for mouth sores, sore throat and trouble swallowing.   Eyes: Negative for visual disturbance.  Respiratory: Negative for cough, chest tightness, shortness of breath and wheezing.   Cardiovascular: Negative for chest pain.  Gastrointestinal: Negative for abdominal distention, abdominal pain, diarrhea, nausea and vomiting.  Endocrine: Negative for polydipsia, polyphagia and polyuria.  Genitourinary: Negative for dysuria, frequency and hematuria.  Musculoskeletal: Positive for arthralgias and joint swelling. Negative for gait problem.  Skin: Positive for rash. Negative for color change and pallor.  Neurological: Negative for dizziness, syncope, light-headedness and headaches.  Hematological: Does not bruise/bleed easily.  Psychiatric/Behavioral: Negative for behavioral problems and confusion.     Physical Exam Updated Vital Signs BP (!) 143/78   Pulse 90   Temp 98.4 F (36.9 C) (Oral)   Resp 17   Ht 5\' 7"  (1.702 m)   Wt 78 kg (172 lb)   SpO2 91%   BMI 26.94 kg/m   Physical Exam  Constitutional: He is oriented to person, place, and time. He appears well-developed and well-nourished. No distress.  HENT:  Head: Normocephalic.  Eyes: Pupils are equal, round, and reactive to light. Conjunctivae are normal. No scleral icterus.  Neck: Normal range of motion. Neck supple. No thyromegaly present.  Cardiovascular: Normal rate and regular rhythm. Exam reveals  no gallop and no friction rub.  No murmur heard. Pulmonary/Chest: Effort normal and breath sounds normal. No respiratory distress. He has no wheezes. He has no rales.  Abdominal: Soft. Bowel sounds are normal. He exhibits no distension. There is no tenderness. There is no rebound.  Musculoskeletal: Normal range of motion.  Has pain with movement of his bilateral wrist.  Question periarticular soft tissue swelling.  Full range of motion of the hands elbows shoulders.  Normal exam to the hips knees ankles.  Neurological: He is alert and oriented to person, place, and time.  Skin: Skin is warm and dry. No rash noted.  That she erythematous macular rash.  He states "this is nothing compared to how it was 5 days ago".  Psychiatric: He has a normal mood and affect. His behavior is normal.  ED Treatments / Results  Labs (all labs ordered are listed, but only abnormal results are displayed) Labs Reviewed  CBC WITH DIFFERENTIAL/PLATELET - Abnormal; Notable for the following components:      Result Value   WBC 12.7 (*)    RDW 15.6 (*)    Neutro Abs 9.8 (*)    All other components within normal limits  BASIC METABOLIC PANEL - Abnormal; Notable for the following components:   CO2 20 (*)    Glucose, Bld 104 (*)    BUN 28 (*)    All other components within normal limits  SEDIMENTATION RATE - Abnormal; Notable for the following components:   Sed Rate 49 (*)    All other components within normal limits  CK - Abnormal; Notable for the following components:   Total CK 33 (*)    All other components within normal limits    EKG None  Radiology Dg Chest 2 View  Result Date: 07/10/2017 CLINICAL DATA:  77 year old male with a history of wheezing and shortness of breath EXAM: CHEST - 2 VIEW COMPARISON:  None. FINDINGS: Cardiomediastinal silhouette within normal limits. No evidence of central vascular congestion. No interlobular septal thickening. No pneumothorax.  No pleural effusion. Stigmata of  emphysema, with increased retrosternal airspace, flattened hemidiaphragms, increased AP diameter, and hyperinflation on the AP view. Coarsened interstitial markings in the lower lungs. Degenerative changes of the spine. No acute displaced fracture identified. IMPRESSION: Chronic lung changes and emphysema without evidence of superimposed acute cardiopulmonary disease. Electronically Signed   By: Corrie Mckusick D.O.   On: 07/10/2017 08:29    Procedures Procedures (including critical care time)  Medications Ordered in ED Medications  ondansetron (ZOFRAN) injection 4 mg (4 mg Intravenous Given 07/10/17 2341)  ketorolac (TORADOL) 30 MG/ML injection 30 mg (30 mg Intravenous Given 07/11/17 0102)     Initial Impression / Assessment and Plan / ED Course  I have reviewed the triage vital signs and the nursing notes.  Pertinent labs & imaging results that were available during my care of the patient were reviewed by me and considered in my medical decision making (see chart for details).    Likely rash and reactive arthritis secondary to medications.  He is improving with p.o. steroids.  He states that he feels like it is uncomfortable to swallow.  His pharynx is normal.  Likely esophagitis from the steroids.  He has no chest pain.  His sed rate is elevated.  His CPK is not significantly elevated.  His renal function is acceptable.  Plan will be continue with steroid taper.  Was given pain medications here.  After completion of the steroids will continue on anti-inflammatories as needed until his joint pain improved.  Primary care follow-up.  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

## 2017-08-04 DIAGNOSIS — L509 Urticaria, unspecified: Secondary | ICD-10-CM | POA: Diagnosis not present

## 2017-08-04 DIAGNOSIS — M2550 Pain in unspecified joint: Secondary | ICD-10-CM | POA: Diagnosis not present

## 2017-08-14 DIAGNOSIS — I6930 Unspecified sequelae of cerebral infarction: Secondary | ICD-10-CM | POA: Diagnosis not present

## 2017-08-14 DIAGNOSIS — L509 Urticaria, unspecified: Secondary | ICD-10-CM | POA: Diagnosis not present

## 2017-08-14 DIAGNOSIS — E1169 Type 2 diabetes mellitus with other specified complication: Secondary | ICD-10-CM | POA: Diagnosis not present

## 2017-08-14 DIAGNOSIS — Z7984 Long term (current) use of oral hypoglycemic drugs: Secondary | ICD-10-CM | POA: Diagnosis not present

## 2017-08-14 DIAGNOSIS — I10 Essential (primary) hypertension: Secondary | ICD-10-CM | POA: Diagnosis not present

## 2017-08-14 DIAGNOSIS — E782 Mixed hyperlipidemia: Secondary | ICD-10-CM | POA: Diagnosis not present

## 2017-08-14 DIAGNOSIS — J439 Emphysema, unspecified: Secondary | ICD-10-CM | POA: Diagnosis not present

## 2017-08-21 DIAGNOSIS — J3089 Other allergic rhinitis: Secondary | ICD-10-CM | POA: Diagnosis not present

## 2017-08-21 DIAGNOSIS — J301 Allergic rhinitis due to pollen: Secondary | ICD-10-CM | POA: Diagnosis not present

## 2017-08-21 DIAGNOSIS — L501 Idiopathic urticaria: Secondary | ICD-10-CM | POA: Diagnosis not present

## 2017-09-21 ENCOUNTER — Ambulatory Visit: Payer: PPO | Admitting: Allergy and Immunology

## 2017-10-07 ENCOUNTER — Ambulatory Visit (INDEPENDENT_AMBULATORY_CARE_PROVIDER_SITE_OTHER): Payer: PPO | Admitting: Adult Health

## 2017-10-07 ENCOUNTER — Encounter: Payer: Self-pay | Admitting: Adult Health

## 2017-10-07 VITALS — BP 139/76 | HR 68 | Ht 67.0 in | Wt 177.5 lb

## 2017-10-07 DIAGNOSIS — I639 Cerebral infarction, unspecified: Secondary | ICD-10-CM | POA: Diagnosis not present

## 2017-10-07 DIAGNOSIS — E785 Hyperlipidemia, unspecified: Secondary | ICD-10-CM | POA: Diagnosis not present

## 2017-10-07 DIAGNOSIS — I6381 Other cerebral infarction due to occlusion or stenosis of small artery: Secondary | ICD-10-CM

## 2017-10-07 NOTE — Patient Instructions (Signed)
Continue aspirin 325 mg daily  and lipitor  for secondary stroke prevention  Check cholesterol levels and consider holding lipitor for now to see if memory complaints continue  Continue to stay active and eat healthy  Continue to follow up with PCP regarding cholesterol and blood pressure management   Continue to monitor blood pressure at home  Maintain strict control of hypertension with blood pressure goal below 130/90, diabetes with hemoglobin A1c goal below 6.5% and cholesterol with LDL cholesterol (bad cholesterol) goal below 70 mg/dL. I also advised the patient to eat a healthy diet with plenty of whole grains, cereals, fruits and vegetables, exercise regularly and maintain ideal body weight.  Followup in the future with me in 6 months or call earlier if needed       Thank you for coming to see [place] at [facility]. I hope we have been able to provide you high quality care today.  You may receive a patient satisfaction survey over the next few weeks. We would appreciate your feedback and comments so that we may continue to improve ourselves and the health of our patients.

## 2017-10-07 NOTE — Progress Notes (Signed)
[facility] [address] [place]. [place] [address] [phone]       OFFICE FOLLOW UP NOTE  Mr. EHREN BERISHA Date of Birth:  1940-10-13 Medical Record Number:  [id number]   Reason for Referral:  hospital stroke follow up  CHIEF COMPLAINT:  Chief Complaint  Patient presents with  . 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 History obtained from patient, daughter and chart review. Reviewed all radiology images and labs personally.  Mr. VIJAY DURFLINGER is a 77 y.o. male with history of HLD and pre-DB presenting with R sided weakness for past 3 days. He did not receive IV t-PA due to delay in arrival. CT head reviewed and showed no acute hemorrhage. MRI brain reviewed and showed small left thalamic lacune and old right thalamic lacune secondary to small vessel disease. Carotid doppler showed bilateral stenosis of 1-39%. 2D echo showed EF 60-65% without source of embolus. Transcranial doppler showed diffuse intracranial atherosclerosis. Patient was previously on aspirin 81mg  PTA and recommended DAPT for 3 weeks and then continue Plavix alone. LDL 100 and recommended lipitor 20mg  daily (previously lipitor 20 on Sun and Wed only). Patient discharged home in stable condition.   Since discharge, patient has been doing well and is accompanied at today's appointment with his daughter.  He continued to take Plavix and aspirin for 3 weeks total without side effects of bleeding or bruising and continued on Plavix only on Sunday.  Monday morning, patient awoke with a large, raised hives in bilateral upper and lower extremities, groin area, chest and abdomen.  Patient was told by PCP to stop Plavix, aspirin and valsartan.  He was placed on prednisone taper but patient has not seen significant difference in his hives at this time.  Denies new medications, laundry  detergent, or body soap.  Denies difficulty breathing or swallowing.  He did state that on Wednesday Thursday and Friday of last week he took [place] cold medicine but he has taken this before and tolerated it well.  He will be following up with PCP on Friday.  He does continue to take Lipitor without side effects or myalgias.  He did attend one appointment with both PT/OT where he was released after first appointment.  Blood pressure today satisfactory at 106/58.  Patient returned to all previous activities without restrictions.  Patient is inquiring whether he would be able to drive at this time.  Denies new or worsening stroke/TIA symptoms.  10/07/17 update: Patient is being seen today for routine follow-up visit and is accompanied by his wife.  He continues to do well from a stroke standpoint.  He continues to take aspirin with mild bruising but no bleeding.  Continues to take atorvastatin without side effects myalgias.  Patient does have complaints of short-term memory loss and is convinced that it is due to Lipitor.  Patient has not had lipid panel completed since hospitalization.  Also complains of numbness bilaterally middle fingers but this is been present since his stroke and has not worsened.  Blood pressure today satisfactory 139/76 and patient does continue to monitor this at home and typically 110-140/60-70.  Complains of difficulty walking at times but believes this could be from his previous hip replacement.  Denies new or worsening stroke/TIA symptoms.     ROS:   14 system review of systems performed  and negative with exception walking difficulty and numbness  PMH:  Past Medical History:  Diagnosis Date  . Arthritis   . Diabetes mellitus without complication ([place])   . Hypertension   . Pre-diabetes    on metformin  . Stroke [facility])     PSH:  Past Surgical History:  Procedure Laterality Date  . BACK SURGERY     c3,c4,c5,c6 ,l4,l5  . EYE SURGERY     bil cataracts  . NASAL  SEPTUM SURGERY    . TONSILLECTOMY    . 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 Marital status: Widowed    Spouse name: Not on file  . Number of children: Not on file  . Years of education: Not on file  . Highest education level: Not on file  Occupational History  . Not on file  Social Needs  . Financial resource strain: Not on file  . Food insecurity:    Worry: Not on file    Inability: Not on file  . Transportation needs:    Medical: Not on file    Non-medical: Not on file  Tobacco Use  . Smoking status: Former Smoker    Types: Cigarettes    Last attempt to quit: 07/09/2014    Years since quitting: 3.2  . Smokeless tobacco: Never Used  . Tobacco comment: 8-10 a day  Substance and Sexual Activity  . Alcohol use: Yes    Alcohol/week: 0.6 oz    Types: 1 Glasses of wine per week    Comment: socially  . Drug use: No  . Sexual activity: Yes  Lifestyle  . Physical activity:    Days per week: Not on file    Minutes per session: Not on file  . Stress: Not on file  Relationships  . Social connections:    Talks on phone: Not on file    Gets together: Not on file    Attends religious service: Not on file    Active member of club or organization: Not on file    Attends meetings of clubs or organizations: Not on file    Relationship status: Not on file  . Intimate partner violence:    Fear of current or ex partner: Not on file    Emotionally abused: Not on file    Physically abused: Not on file    Forced sexual activity: Not on file  Other Topics Concern  . Not on file  Social History Narrative  . Not on file    Family History:  Family History  Problem Relation Age of Onset  . Stroke Maternal Grandmother     Medications:   Current Outpatient  Medications on File Prior to Visit  Medication Sig Dispense Refill  . aspirin EC 325 MG tablet Take 1 tablet (325 mg total) by mouth daily. 30 tablet 0  . atorvastatin (LIPITOR) 20 MG tablet Take 1 tablet (20 mg total) by mouth daily at 6 PM. 30 tablet 0  . Calcium Carb-Cholecalciferol (CALCIUM 600 + D PO) Take 1 tablet by mouth daily.     . Calcium Polycarbophil (FIBER-CAPS PO) Take 1 capsule by mouth 2 (two) times daily.     . cetirizine (KLS ALLER-TEC) 10 MG tablet Take 10 mg by mouth daily.    Marland Kitchen  cholecalciferol (VITAMIN D) 1000 units tablet Take 2,000 Units by mouth at bedtime.     . fexofenadine (ALLEGRA) 180 MG tablet Take 180 mg by mouth every evening.     . fluticasone (CUTIVATE) 0.05 % cream Apply 1 application topically 2 (two) times daily as needed (irritation).     . metFORMIN (GLUCOPHAGE-XR) 500 MG 24 hr tablet Take 500 mg by mouth 2 (two) times daily.     . Multiple Vitamin (MULTIVITAMIN) tablet Take 1 tablet by mouth daily.    . Multiple Vitamins-Minerals (RA VISION-VITE PRESERVE PO) Take 1 tablet by mouth 2 (two) times daily.     . polyethylene glycol (MIRALAX / GLYCOLAX) packet Take 17 g by mouth 2 (two) times daily. (Patient taking differently: Take 17 g by mouth daily as needed for mild constipation. ) 14 each 0  . valsartan (DIOVAN) 80 MG tablet Take 80 mg by mouth daily.    . ferrous sulfate (FERROUSUL) 325 (65 FE) MG tablet Take 1 tablet (325 mg total) by mouth 3 (three) times daily with meals.  3   No current facility-administered medications on file prior to visit.     Allergies:   Allergies  Allergen Reactions  . Iodine Hives and Other (See Comments)    Internal only     Physical Exam  Vitals:   10/07/17 1249  BP: 139/76  Pulse: 68  Weight: 177 lb 8 oz (80.5 kg)  Height: 5\' 7"  (1.702 m)   Body mass index is 27.8 kg/m. No exam data present  General: well developed, well nourished, seated, in no evident distress Head: head normocephalic and atraumatic.    Neck: supple with no carotid or supraclavicular bruits Cardiovascular: regular rate and rhythm, no murmurs Musculoskeletal: no deformity Skin: Large, red, raised hives spread throughout body Vascular:  Normal pulses all extremities  Neurologic Exam Mental Status: Awake and fully alert. Oriented to place and time. Recent and remote memory intact. Attention span, concentration and fund of knowledge appropriate. Mood and affect appropriate.  Cranial Nerves: Fundoscopic exam reveals sharp disc margins. Pupils equal, briskly reactive to light. Extraocular movements full without nystagmus. Visual fields full to confrontation. Hearing intact. Facial sensation intact. Face, tongue, palate moves normally and symmetrically.  Motor: Normal bulk and tone. Normal strength in all tested extremity muscles. Sensory.: intact to touch , pinprick , position and vibratory sensation.  Coordination: Rapid alternating movements normal in all extremities. Finger-to-nose and heel-to-shin performed accurately bilaterally. Gait and Station: Arises from chair without difficulty. Stance is normal. Gait demonstrates normal stride length and balance . Able to heel, toe and tandem walk without difficulty.  Reflexes: 1+ and symmetric. Toes downgoing.    NIHSS  0 Modified Rankin  0    Diagnostic Data (Labs, Imaging, Testing)  CT HEAD WO CONTRAST 06/07/17 IMPRESSION: 1. No acute intracranial abnormalities. 2. Small vessel ischemic change 3. Left maxillary sinus opacification.  MR BRAIN WO CONTRAST 06/08/17 IMPRESSION: 1. 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 2. 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  2D ECHOCARDIOGRAM 06/07/17 Study Conclusions - Procedure narrative: Transthoracic echocardiography. Image   quality was poor. The study was technically difficult, as a   result of poor acoustic windows and poor sound wave transmission. - Left  ventricle: The cavity size was normal. Wall thickness was   increased in a pattern of mild LVH. Systolic function was normal.   The estimated ejection fraction was in the range of 60% to  65%.   Wall motion was normal; there were no regional wall motion   abnormalities. Doppler parameters are consistent with abnormal   left ventricular relaxation (grade 1 diastolic dysfunction). The   E/e&' ratio is <8, suggesting normal LV filling pressure. - Mitral valve: Mildly thickened leaflets . There was trivial   regurgitation. - Left atrium: The atrium was normal in size. - Inferior vena cava: The vessel was normal in size. The   respirophasic diameter changes were in the normal range (>= 50%),   consistent with normal central venous pressure. Impressions: - Technically difficult study. LVEF 60-65%, mild LVH, normal wall   motion, grade 1 DD, normal LV filling pressure, trivial MR,   normal LA size, normal IVC.  US CAROTID DUPLEX BILATERAL 06/08/17 Final Interpretation: Right Carotid: Velocities in the right ICA are consistent with a 1-39% stenosis. Left Carotid: Velocities in the left ICA are consistent with a 1-39% stenosis. Vertebrals: Bilateral vertebral arteries demonstrate antegrade flow.  [place] TRANSCRANIAL DOPPLER 06/08/17 Final Interpretation: Poor orbital windows limits evaluation of ophthalmic and terminal carotid arteries. Normal mean flow velocities in majority of identified vessels of anterior and posterior cerebral circulation. Globally elevated pulsatility indexes suggest diffuse  intracranial atherosclerosis likely    ASSESSMENT: SENAY SISTRUNK is a 77 y.o. 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 Vascular risk factors include HLD, HTN and pre-DM.  Possible medication reaction to Plavix and patient was restarted on aspirin 325.  Patient recovered well without residual symptoms.    PLAN: -Recommended to start aspirin 325 mg daily  and continue  Lipitor for secondary stroke prevention -Lipid levels checked today and if satisfactory can consider holding Lipitor to see if this improves memory -f/u with PCP regarding HLD and HTN management -Advised to continue to stay active and maintain a healthy diet -Maintain strict control of hypertension with blood pressure goal below 130/90, diabetes with hemoglobin A1c goal below 6.5% and cholesterol with LDL cholesterol (bad cholesterol) goal below 70 mg/dL. I also advised the patient to eat a healthy diet with plenty of whole grains, cereals, fruits and vegetables, exercise regularly and maintain ideal body weight.  Follow up in 6 months or call earlier if needed   Greater than 50% time during this 25 minute consultation visit was spent on counseling and coordination of care about HLD, discussion about risk benefit of anticoagulation and answering questions.    Venancio Poisson, AGNP-BC  [facility] [address] [place], [place] [address]  Phone [phone] Fax [phone]

## 2017-10-08 ENCOUNTER — Telehealth: Payer: Self-pay

## 2017-10-08 LAB — LIPID PANEL
CHOL/HDL RATIO: 2.2 ratio (ref 0.0–5.0)
Cholesterol, Total: 156 mg/dL (ref 100–199)
HDL: 70 mg/dL (ref >39)
LDL Calculated: 62 mg/dL (ref 0–99)
Triglycerides: 120 mg/dL (ref 0–149)
VLDL Cholesterol Cal: 24 mg/dL (ref 5–40)

## 2017-10-08 NOTE — Telephone Encounter (Signed)
Notes recorded by Marval Regal, RN on 10/08/2017 at 2:21 PM EDT Rn call patient about his LDL being 62. RN stated Janett Billow NP recommend stopping lipitor for a week to see if his improvement in memory.If the memory has not improve restart the medication as memory loss side effects with lipitor is unlikely from the statin use. Pt will call back in a week,and verbalized understanding. ------

## 2017-10-08 NOTE — Telephone Encounter (Signed)
-----   Message from Timothy Poisson, NP sent at 10/08/2017  7:32 AM EDT ----- Please notify patient that his LDL is 62. He can stop lipitor for a week to see if he sees improvement in his memory. If not, recommend restarting as memory loss side effects are most likely not due to statin use. Thank you.

## 2017-10-22 NOTE — Progress Notes (Signed)
I agree with the above plan 

## 2017-11-20 DIAGNOSIS — M542 Cervicalgia: Secondary | ICD-10-CM | POA: Diagnosis not present

## 2017-11-20 DIAGNOSIS — I10 Essential (primary) hypertension: Secondary | ICD-10-CM | POA: Diagnosis not present

## 2017-11-20 DIAGNOSIS — Z23 Encounter for immunization: Secondary | ICD-10-CM | POA: Diagnosis not present

## 2018-02-19 DIAGNOSIS — E1169 Type 2 diabetes mellitus with other specified complication: Secondary | ICD-10-CM | POA: Diagnosis not present

## 2018-02-19 DIAGNOSIS — N529 Male erectile dysfunction, unspecified: Secondary | ICD-10-CM | POA: Diagnosis not present

## 2018-02-19 DIAGNOSIS — I6930 Unspecified sequelae of cerebral infarction: Secondary | ICD-10-CM | POA: Diagnosis not present

## 2018-02-19 DIAGNOSIS — J439 Emphysema, unspecified: Secondary | ICD-10-CM | POA: Diagnosis not present

## 2018-02-19 DIAGNOSIS — I10 Essential (primary) hypertension: Secondary | ICD-10-CM | POA: Diagnosis not present

## 2018-02-19 DIAGNOSIS — E782 Mixed hyperlipidemia: Secondary | ICD-10-CM | POA: Diagnosis not present

## 2018-03-01 DIAGNOSIS — M160 Bilateral primary osteoarthritis of hip: Secondary | ICD-10-CM | POA: Diagnosis not present

## 2018-03-01 DIAGNOSIS — M1612 Unilateral primary osteoarthritis, left hip: Secondary | ICD-10-CM | POA: Diagnosis not present

## 2018-04-12 ENCOUNTER — Ambulatory Visit: Payer: PPO | Admitting: Adult Health

## 2018-04-12 ENCOUNTER — Encounter: Payer: Self-pay | Admitting: Adult Health

## 2018-04-12 VITALS — BP 100/49 | HR 66 | Ht 67.0 in | Wt 163.6 lb

## 2018-04-12 DIAGNOSIS — I10 Essential (primary) hypertension: Secondary | ICD-10-CM

## 2018-04-12 DIAGNOSIS — I6381 Other cerebral infarction due to occlusion or stenosis of small artery: Secondary | ICD-10-CM

## 2018-04-12 DIAGNOSIS — E119 Type 2 diabetes mellitus without complications: Secondary | ICD-10-CM | POA: Diagnosis not present

## 2018-04-12 DIAGNOSIS — H52221 Regular astigmatism, right eye: Secondary | ICD-10-CM | POA: Diagnosis not present

## 2018-04-12 DIAGNOSIS — E785 Hyperlipidemia, unspecified: Secondary | ICD-10-CM

## 2018-04-12 DIAGNOSIS — H524 Presbyopia: Secondary | ICD-10-CM | POA: Diagnosis not present

## 2018-04-12 DIAGNOSIS — Z961 Presence of intraocular lens: Secondary | ICD-10-CM | POA: Diagnosis not present

## 2018-04-12 DIAGNOSIS — I639 Cerebral infarction, unspecified: Secondary | ICD-10-CM | POA: Diagnosis not present

## 2018-04-12 DIAGNOSIS — H35363 Drusen (degenerative) of macula, bilateral: Secondary | ICD-10-CM | POA: Diagnosis not present

## 2018-04-12 NOTE — Progress Notes (Signed)
[facility] [address] [place]. [place] [address] [phone]       OFFICE FOLLOW UP NOTE  Timothy Brewer Date of Birth:  06-Oct-1940 Medical Record Number:  [id number]   Reason for Referral:  hospital stroke follow up  CHIEF COMPLAINT:  Chief Complaint  Patient presents with  . Follow-up    6 month follow up, Alone. Treatment room. No concerns at this time.     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  He continues to do well from a stroke standpoint without residual deficits or recurring symptoms.  He continues on aspirin without side effects of bleeding or bruising. Blood pressure today 100/49.   He complained of memory impairment at prior visit and after satisfactory LDL, recommended to discontinue atorvastatin as patient convinced that his memory loss was a side effect of atorvastatin. Apparently, patient did not stop atorvastatin and states his memory has improved with only age related mild short term memory difficulties. He denies any myaglias on atorvastatin. He did have recent lab work done by PCP with satisfactory levels.  He continues to maintain a healthy diet with over 20 pound weight loss.  Denies new or worsening stroke/TIA symptoms.   History summary:  Timothy Brewer is being seen today in the office for left thalamic stroke on 06/07/17. History obtained from patient, daughter and chart review. Reviewed all radiology images and labs personally.  [name] is a 78 y.o. male with history of HLD and pre-DB presenting with R sided weakness for past 3 days. He did not receive IV t-PA due to delay in arrival. CT head reviewed and showed no acute hemorrhage. MRI brain reviewed and showed small left thalamic lacune and old right thalamic lacune secondary to small vessel disease. Carotid doppler showed bilateral stenosis of 1-39%. 2D echo showed EF 60-65% without source of embolus.  Transcranial doppler showed diffuse intracranial atherosclerosis. Patient was previously on aspirin 81mg  PTA and recommended DAPT for 3 weeks and then continue Plavix alone. LDL 100 and recommended lipitor 20mg  daily (previously lipitor 20 on Sun and Wed only). Patient discharged home in stable condition.   Since discharge, patient has been doing well and is accompanied at today's appointment with his daughter.  He continued to take Plavix and aspirin for 3 weeks total without side effects of bleeding or bruising and continued on Plavix only on Sunday.  Monday morning, patient awoke with a large, raised hives in bilateral upper and lower extremities, groin area, chest and abdomen.  Patient was told by PCP to stop Plavix, aspirin and valsartan.  He was placed on prednisone taper but patient has not seen significant difference in his hives at this time.  Denies new medications, laundry detergent, or body soap.  Denies difficulty breathing or swallowing.  He did state that on Wednesday Thursday and Friday of last week he took Airborne cold medicine but he has taken this before and tolerated it well.  He will be following up with PCP on Friday.  He does continue to take Lipitor without side effects or myalgias.  He did attend one appointment with both PT/OT where he was released after first appointment.  Blood pressure today satisfactory at 106/58.  Patient returned to all previous activities without restrictions.  Patient is inquiring whether he would be able to drive at this time.  Denies new or worsening stroke/TIA symptoms.  10/07/17 update: Patient is  being seen today for routine follow-up visit and is accompanied by his wife.  He continues to do well from a stroke standpoint.  He continues to take aspirin with mild bruising but no bleeding.  Continues to take atorvastatin without side effects myalgias.  Patient does have complaints of short-term memory loss and is convinced that it is due to Lipitor.  Patient  has not had lipid panel completed since hospitalization.  Also complains of numbness bilaterally middle fingers but this is been present since his stroke and has not worsened.  Blood pressure today satisfactory 139/76 and patient does continue to monitor this at home and typically 110-140/60-70.  Complains of difficulty walking at times but believes this could be from his previous hip replacement.  Denies new or worsening stroke/TIA symptoms.     ROS:   14 system review of systems performed and negative with exception of no concerns  PMH:  Past Medical History:  Diagnosis Date  . Arthritis   . Diabetes mellitus without complication ([place])   . Hypertension   . Pre-diabetes    on metformin  . Stroke [facility])     PSH:  Past Surgical History:  Procedure Laterality Date  . BACK SURGERY     c3,c4,c5,c6 ,l4,l5  . EYE SURGERY     bil cataracts  . NASAL SEPTUM SURGERY    . TONSILLECTOMY    . 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 Marital status: Widowed    Spouse name: Not on file  . Number of children: Not on file  . Years of education: Not on file  . Highest education level: Not on file  Occupational History  . Not on file  Social Needs  . Financial resource strain: Not on file  . Food insecurity:    Worry: Not on file    Inability: Not on file  . Transportation needs:    Medical: Not on file    Non-medical: Not on file  Tobacco Use  . Smoking status: Former Smoker    Types: Cigarettes    Last attempt to quit: 07/09/2014    Years since quitting: 3.7  . Smokeless tobacco: Never Used  . Tobacco comment: 8-10 a day  Substance and Sexual Activity  . Alcohol use: Yes    Alcohol/week: 1.0 standard drinks    Types: 1 Glasses of wine per week    Comment:  socially  . Drug use: No  . Sexual activity: Yes  Lifestyle  . Physical activity:    Days per week: Not on file    Minutes per session: Not on file  . Stress: Not on file  Relationships  . Social connections:    Talks on phone: Not on file    Gets together: Not on file    Attends religious service: Not on file    Active member of club or organization: Not on file    Attends meetings of clubs or organizations: Not on file    Relationship status: Not on file  . Intimate partner violence:    Fear of current or ex partner: Not on file    Emotionally abused: Not on file    Physically abused: Not on file    Forced sexual activity: Not on file  Other Topics Concern  .  Not on file  Social History Narrative  . Not on file    Family History:  Family History  Problem Relation Age of Onset  . Stroke Maternal Grandmother     Medications:   Current Outpatient Medications on File Prior to Visit  Medication Sig Dispense Refill  . aspirin EC 325 MG tablet Take 1 tablet (325 mg total) by mouth daily. 30 tablet 0  . atorvastatin (LIPITOR) 20 MG tablet Take 1 tablet (20 mg total) by mouth daily at 6 PM. 30 tablet 0  . Calcium Carb-Cholecalciferol (CALCIUM 600 + D PO) Take 1 tablet by mouth daily.     . Calcium Polycarbophil (FIBER-CAPS PO) Take 1 capsule by mouth 2 (two) times daily.     . cetirizine (KLS ALLER-TEC) 10 MG tablet Take 10 mg by mouth daily.    . cholecalciferol (VITAMIN D) 1000 units tablet Take 2,000 Units by mouth at bedtime.     . ferrous sulfate (FERROUSUL) 325 (65 FE) MG tablet Take 1 tablet (325 mg total) by mouth 3 (three) times daily with meals.  3  . fexofenadine (ALLEGRA) 180 MG tablet Take 180 mg by mouth every evening.     . fluticasone (CUTIVATE) 0.05 % cream Apply 1 application topically 2 (two) times daily as needed (irritation).     . metFORMIN (GLUCOPHAGE-XR) 500 MG 24 hr tablet Take 500 mg by mouth 2 (two) times daily.     . Multiple Vitamin (MULTIVITAMIN)  tablet Take 1 tablet by mouth daily.    . Multiple Vitamins-Minerals (RA VISION-VITE PRESERVE PO) Take 1 tablet by mouth 2 (two) times daily.     . polyethylene glycol (MIRALAX / GLYCOLAX) packet Take 17 g by mouth 2 (two) times daily. (Patient taking differently: Take 17 g by mouth daily as needed for mild constipation. ) 14 each 0  . valsartan (DIOVAN) 80 MG tablet Take 80 mg by mouth daily.     No current facility-administered medications on file prior to visit.     Allergies:   Allergies  Allergen Reactions  . Iodine Hives and Other (See Comments)    Internal only     Physical Exam  Vitals:   04/12/18 1314  BP: (!) 100/49  Pulse: 66  Weight: 163 lb 9.6 oz (74.2 kg)  Height: 5\' 7"  (1.702 m)   Body mass index is 25.62 kg/m. No exam data present  General: well developed, well nourished, pleasant elderly Caucasian male, seated, in no evident distress Head: head normocephalic and atraumatic.   Neck: supple with no carotid or supraclavicular bruits Cardiovascular: regular rate and rhythm, no murmurs Musculoskeletal: no deformity Skin: Negative Vascular:  Normal pulses all extremities  Neurologic Exam Mental Status: Awake and fully alert. Oriented to place and time. Recent and remote memory intact. Attention span, concentration and fund of knowledge appropriate. Mood and affect appropriate.  Cranial Nerves: Pupils equal, briskly reactive to light. Extraocular movements full without nystagmus. Visual fields full to confrontation. Hearing intact. Facial sensation intact. Face, tongue, palate moves normally and symmetrically.  Motor: Normal bulk and tone. Normal strength in all tested extremity muscles. Sensory.: intact to touch , pinprick , position and vibratory sensation.  Coordination: Rapid alternating movements normal in all extremities. Finger-to-nose and heel-to-shin performed accurately bilaterally. Gait and Station: Arises from chair without difficulty. Stance is  normal. Gait demonstrates normal stride length and balance . Able to heel, toe and tandem walk without difficulty.  Reflexes: 1+ and symmetric. Toes downgoing.  Diagnostic Data (Labs, Imaging, Testing)  CT HEAD WO CONTRAST 06/07/17 IMPRESSION: 1. No acute intracranial abnormalities. 2. Small vessel ischemic change 3. Left maxillary sinus opacification.  MR BRAIN WO CONTRAST 06/08/17 IMPRESSION: 1. 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 2. 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  2D ECHOCARDIOGRAM 06/07/17 Study Conclusions - Procedure narrative: Transthoracic echocardiography. Image   quality was poor. The study was technically difficult, as a   result of poor acoustic windows and poor sound wave transmission. - Left ventricle: The cavity size was normal. Wall thickness was   increased in a pattern of mild LVH. Systolic function was normal.   The estimated ejection fraction was in the range of 60% to 65%.   Wall motion was normal; there were no regional wall motion   abnormalities. Doppler parameters are consistent with abnormal   left ventricular relaxation (grade 1 diastolic dysfunction). The   E/e&' ratio is <8, suggesting normal LV filling pressure. - Mitral valve: Mildly thickened leaflets . There was trivial   regurgitation. - Left atrium: The atrium was normal in size. - Inferior vena cava: The vessel was normal in size. The   respirophasic diameter changes were in the normal range (>= 50%),   consistent with normal central venous pressure. Impressions: - Technically difficult study. LVEF 60-65%, mild LVH, normal wall   motion, grade 1 DD, normal LV filling pressure, trivial MR,   normal LA size, normal IVC.  US CAROTID DUPLEX BILATERAL 06/08/17 Final Interpretation: Right Carotid: Velocities in the right ICA are consistent with a 1-39% stenosis. Left Carotid: Velocities in the left ICA are  consistent with a 1-39% stenosis. Vertebrals: Bilateral vertebral arteries demonstrate antegrade flow.  [place] TRANSCRANIAL DOPPLER 06/08/17 Final Interpretation: Poor orbital windows limits evaluation of ophthalmic and terminal carotid arteries. Normal mean flow velocities in majority of identified vessels of anterior and posterior cerebral circulation. Globally elevated pulsatility indexes suggest diffuse  intracranial atherosclerosis likely    ASSESSMENT: Timothy Brewer is a 78 y.o. 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 Vascular risk factors include HLD, HTN and pre-DM.  Possible medication reaction to Plavix and patient was restarted on aspirin 325.  He returns today for follow-up and continues to do well from a stroke standpoint without residual deficits or recurring symptoms.    PLAN: -Continue aspirin 325 mg daily  and continue Lipitor for secondary stroke prevention -f/u with PCP regarding HLD and HTN management -Advised to continue to stay active and maintain a healthy diet along with ongoing weight loss -Maintain strict control of hypertension with blood pressure goal below 130/90, diabetes with hemoglobin A1c goal below 6.5% and cholesterol with LDL cholesterol (bad cholesterol) goal below 70 mg/dL. I also advised the patient to eat a healthy diet with plenty of whole grains, cereals, fruits and vegetables, exercise regularly and maintain ideal body weight.  Stable from stroke standpoint and recommended follow-up as needed or call with questions, concerns or need of future follow-up appointment   Greater than 50% time during this 25 minute consultation visit was spent on counseling and coordination of care about HLD, discussion about risk benefit of anticoagulation and answering questions.    Venancio Poisson, AGNP-BC  [facility] [address] [place], [place] [address]  Phone [phone] Fax  [phone]

## 2018-04-12 NOTE — Progress Notes (Signed)
I agree with the above plan 

## 2018-04-12 NOTE — Patient Instructions (Signed)
Continue aspirin 325 mg daily  and lipitor  for secondary stroke prevention  Continue to follow up with PCP regarding blood pressure and cholesterol management   Continue to stay active and maintain a healthy diet - great job with your weight loss!!   Continue to monitor blood pressure at home  Maintain strict control of hypertension with blood pressure goal below 130/90, diabetes with hemoglobin A1c goal below 6.5% and cholesterol with LDL cholesterol (bad cholesterol) goal below 70 mg/dL. I also advised the patient to eat a healthy diet with plenty of whole grains, cereals, fruits and vegetables, exercise regularly and maintain ideal body weight.  Followup in the future with me as needed or call earlier if needed       Thank you for coming to see [place] at [facility]. I hope we have been able to provide you high quality care today.  You may receive a patient satisfaction survey over the next few weeks. We would appreciate your feedback and comments so that we may continue to improve ourselves and the health of our patients.

## 2018-05-21 DIAGNOSIS — H35363 Drusen (degenerative) of macula, bilateral: Secondary | ICD-10-CM | POA: Diagnosis not present

## 2018-05-21 DIAGNOSIS — H35371 Puckering of macula, right eye: Secondary | ICD-10-CM | POA: Diagnosis not present

## 2018-08-29 IMAGING — CR DG HIP (WITH OR WITHOUT PELVIS) 2-3V*L*
3 series · 3 of 3 positions shown · non-contrast
Comparison: 02/17/2017

CLINICAL DATA: Left hip pain x 4 days; pt states that had hip
surgery 2 weeks ago, and was doing fine. 4 days ago he was on his
knee picking up laundry and left hip started; pain radiates to left
buttocks

EXAM:
DG HIP (WITH OR WITHOUT PELVIS) 2-3V LEFT

[t pelvis ap]
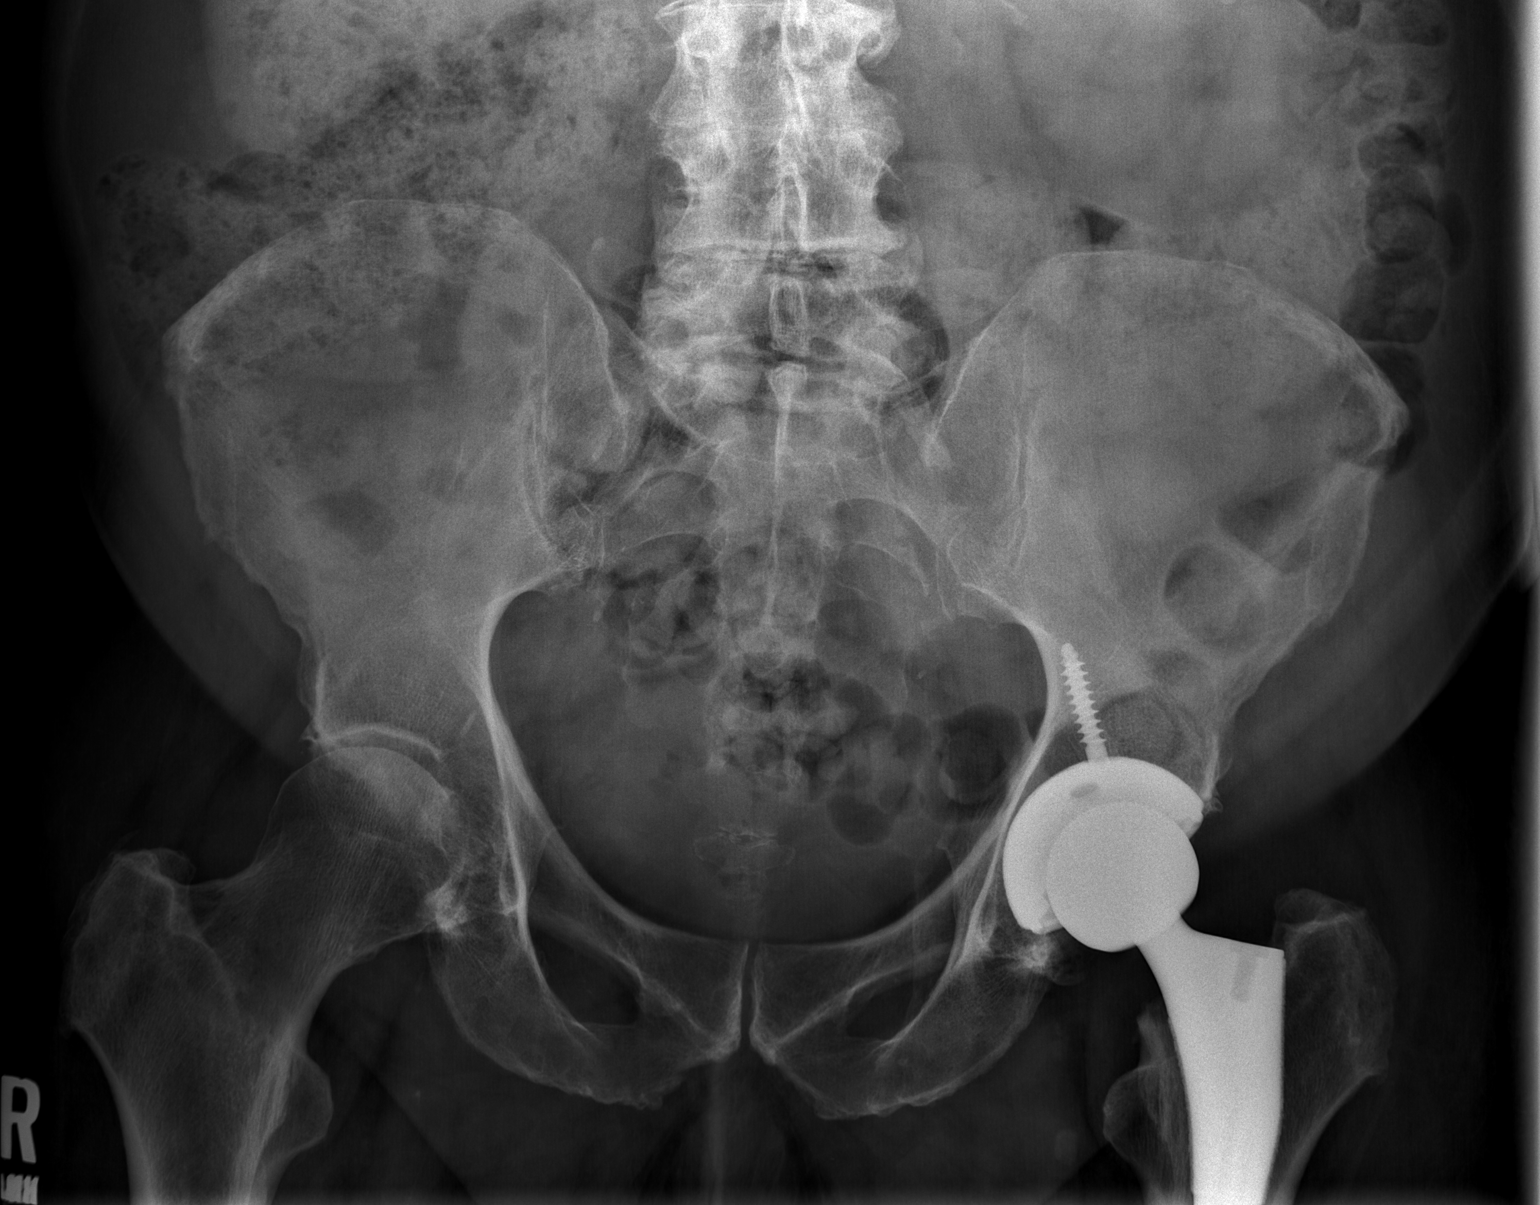

[t hip ap left]
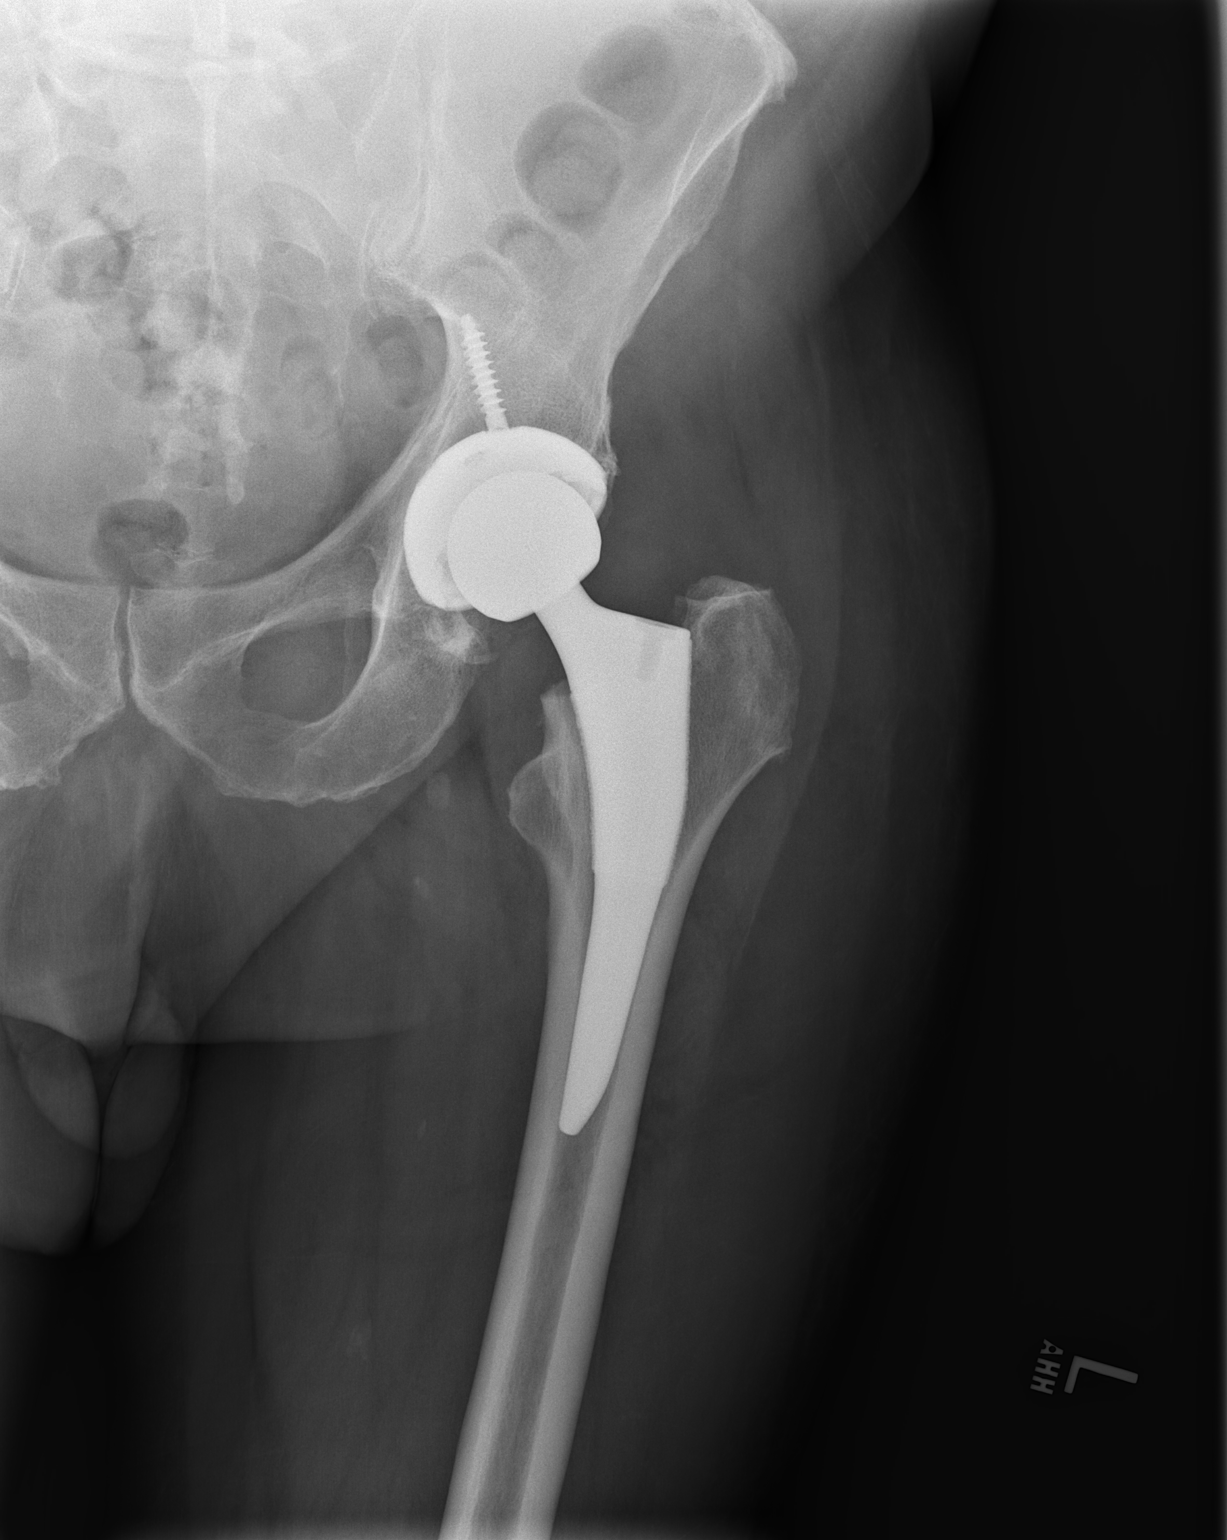

[t hip frog leg left]
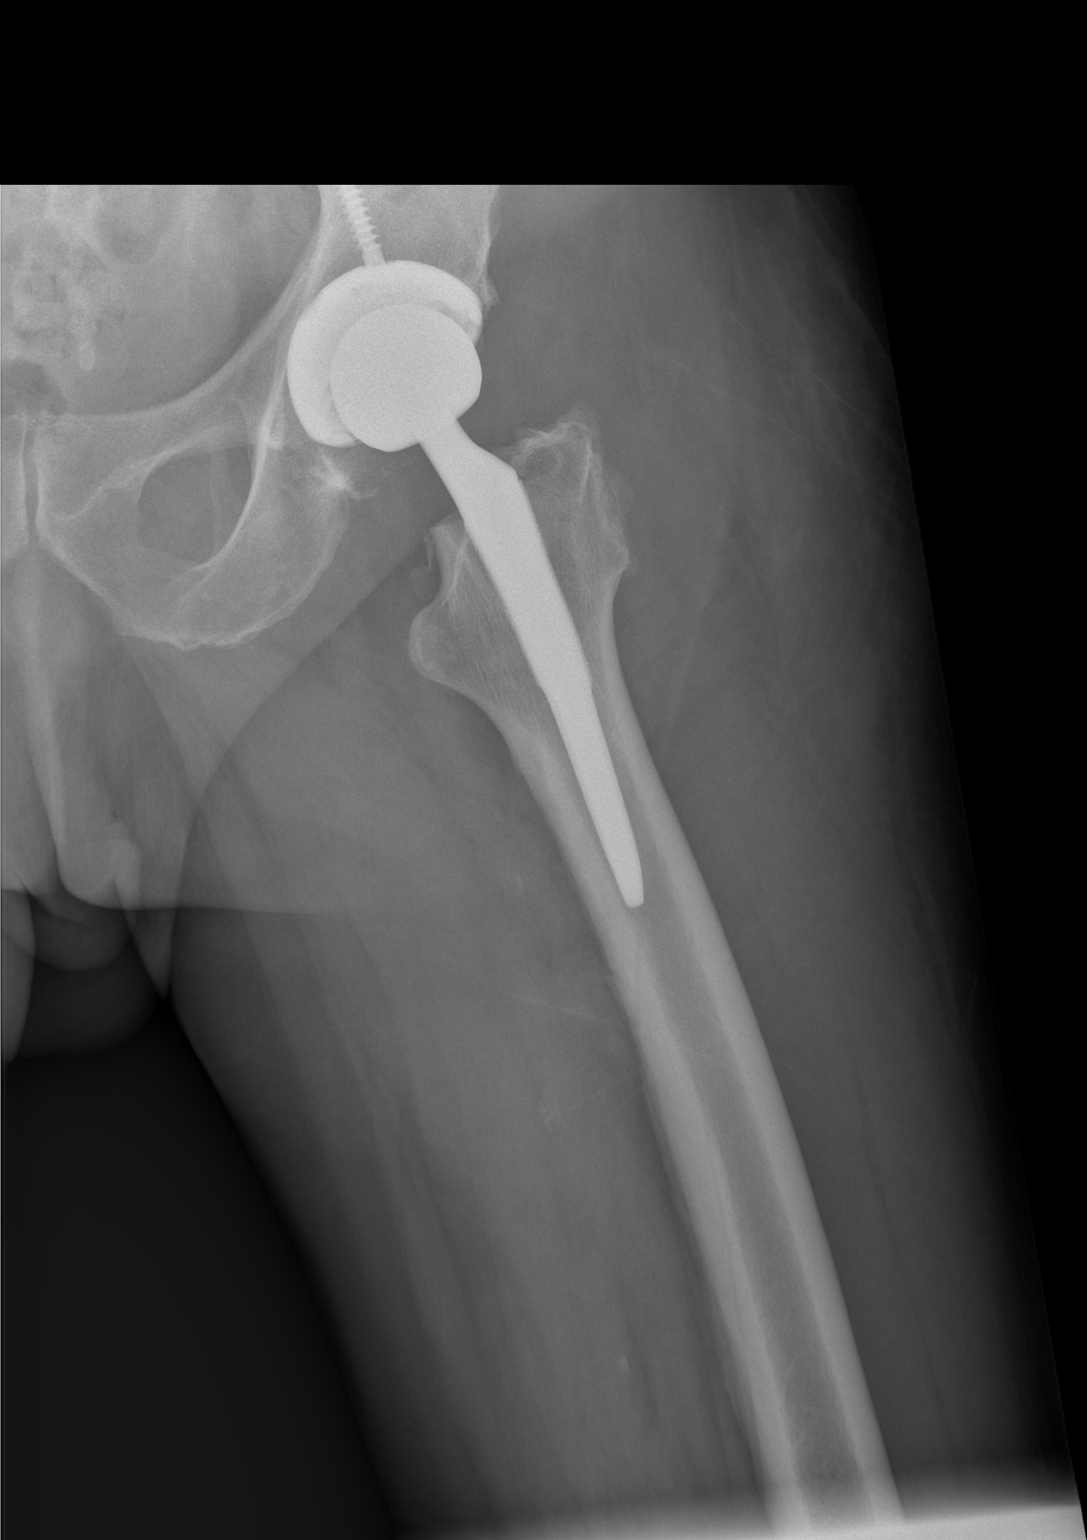

[3 of 3 positions shown; findings below may reference images not displayed]

FINDINGS: PE left hip total arthroplasty is well-seated and well-aligned,
unchanged from prior exam.

There is no fracture. No bone lesion. No evidence of loosening of
the orthopedic hardware.

Mild superolateral right hip joint space narrowing stable from the
prior study. SI joints and symphysis pubis are normally aligned.

Soft tissues are unremarkable.
IMPRESSION: 1. No acute findings.
2. Well-positioned left hip total arthroplasty. No evidence
prosthetic loosening.

## 2018-09-20 DIAGNOSIS — I10 Essential (primary) hypertension: Secondary | ICD-10-CM | POA: Diagnosis not present

## 2018-09-20 DIAGNOSIS — E1169 Type 2 diabetes mellitus with other specified complication: Secondary | ICD-10-CM | POA: Diagnosis not present

## 2018-09-20 DIAGNOSIS — J439 Emphysema, unspecified: Secondary | ICD-10-CM | POA: Diagnosis not present

## 2018-09-20 DIAGNOSIS — E782 Mixed hyperlipidemia: Secondary | ICD-10-CM | POA: Diagnosis not present

## 2018-09-20 DIAGNOSIS — N529 Male erectile dysfunction, unspecified: Secondary | ICD-10-CM | POA: Diagnosis not present

## 2018-09-20 DIAGNOSIS — I6930 Unspecified sequelae of cerebral infarction: Secondary | ICD-10-CM | POA: Diagnosis not present

## 2018-10-15 DIAGNOSIS — E1169 Type 2 diabetes mellitus with other specified complication: Secondary | ICD-10-CM | POA: Diagnosis not present

## 2018-10-15 DIAGNOSIS — E782 Mixed hyperlipidemia: Secondary | ICD-10-CM | POA: Diagnosis not present

## 2018-11-17 DIAGNOSIS — E119 Type 2 diabetes mellitus without complications: Secondary | ICD-10-CM | POA: Diagnosis not present

## 2018-11-17 DIAGNOSIS — H353112 Nonexudative age-related macular degeneration, right eye, intermediate dry stage: Secondary | ICD-10-CM | POA: Diagnosis not present

## 2018-11-17 DIAGNOSIS — H353122 Nonexudative age-related macular degeneration, left eye, intermediate dry stage: Secondary | ICD-10-CM | POA: Diagnosis not present

## 2018-12-04 IMAGING — MR MR CERVICAL SPINE W/O CM
4 of 6 series · 19 of 48 positions shown · non-contrast
Comparison: MR brain 06/08/2017.  CT head 06/07/2017.

CLINICAL DATA: RIGHT-sided weakness. Onset several days ago.
Proprioception defects. Hyperreflexia.

EXAM:
MRI CERVICAL SPINE WITHOUT CONTRAST
TECHNIQUE: Multiplanar, multisequence MR imaging of the cervical spine was
performed. No intravenous contrast was administered.

[Series 5: T2 · sagittal · 3.0mm · 0.39mm/px · 5 of 12 slices shown (1 of 2)]
[im 1/12]
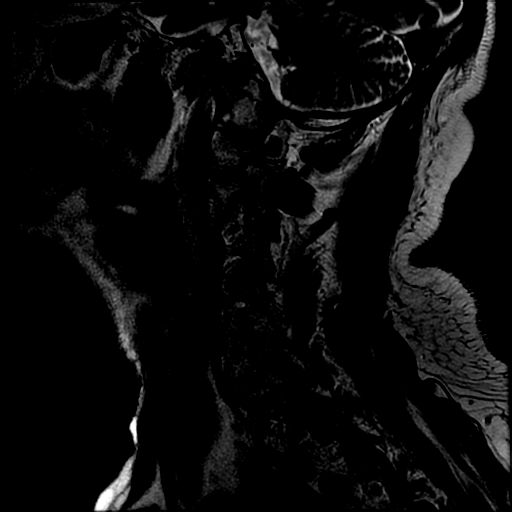
[im 3/12]
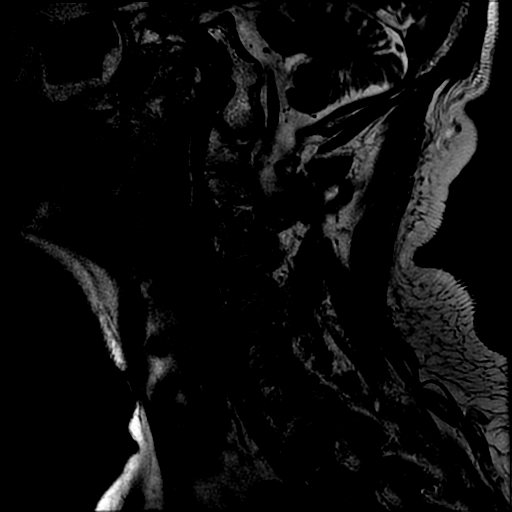
[im 6/12]
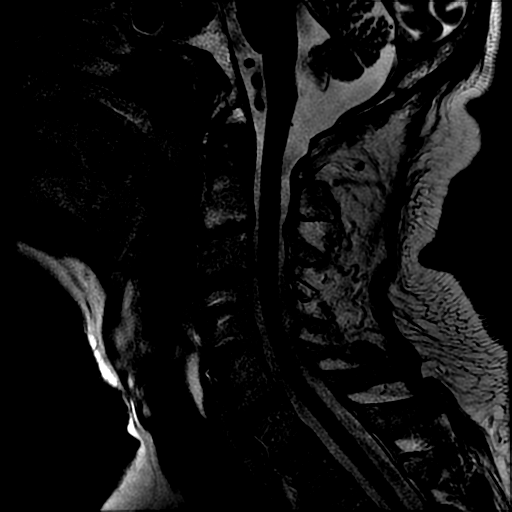
[im 9/12]
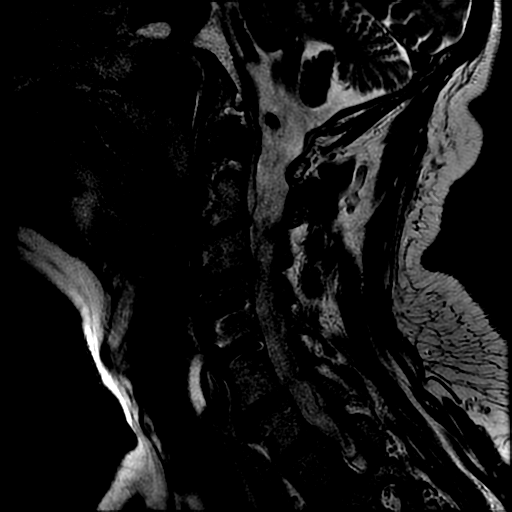
[im 12/12]
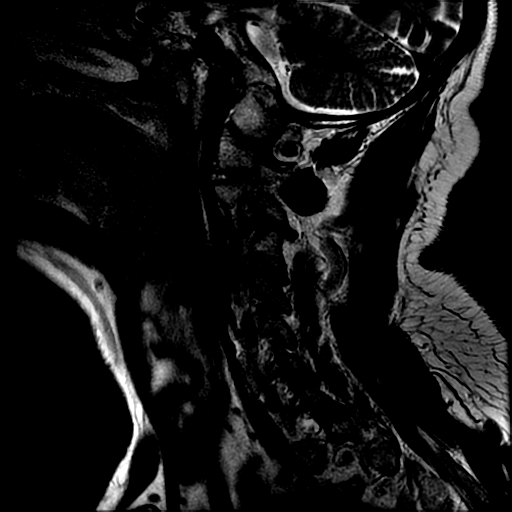

[Series 6: T1 · sagittal · 3.0mm · 0.39mm/px · 3 of 12 slices shown (1 of 2)]
[im 1/12]
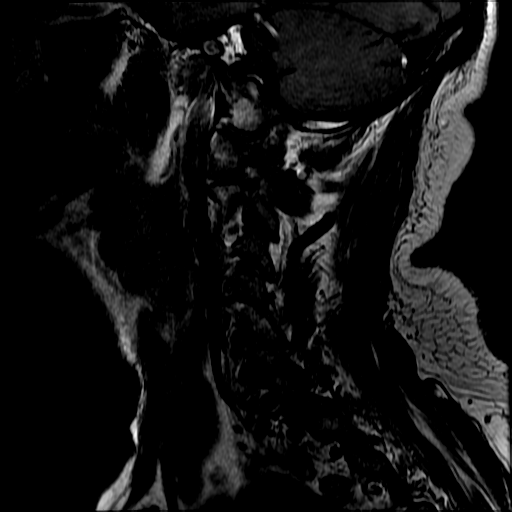
[im 6/12]
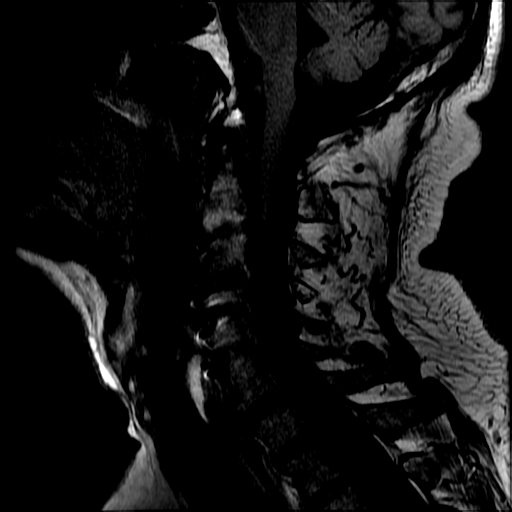
[im 12/12]
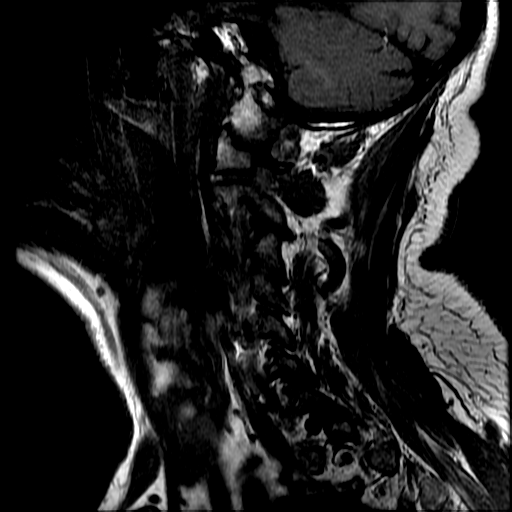

[Series 9: T2 · axial · 3.0mm · 0.39mm/px · z∈[-83,+14]mm · 8 of 31 slices shown (2 of 2)]
[im 1/31]
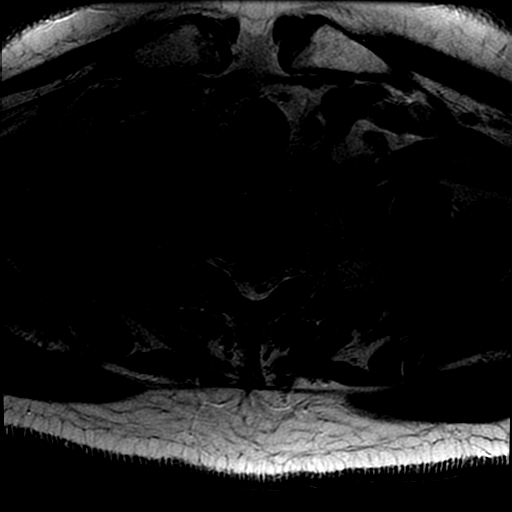
[im 5/31]
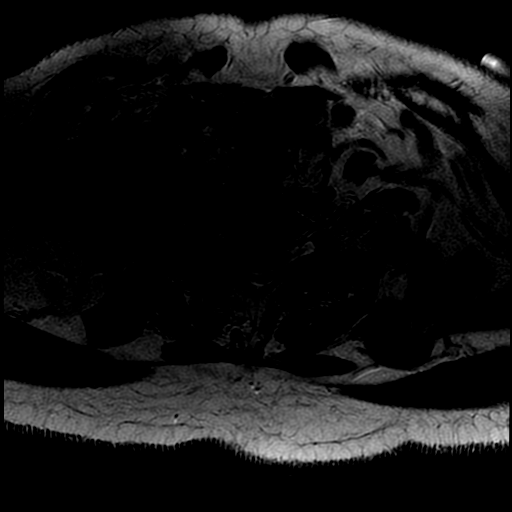
[im 10/31]
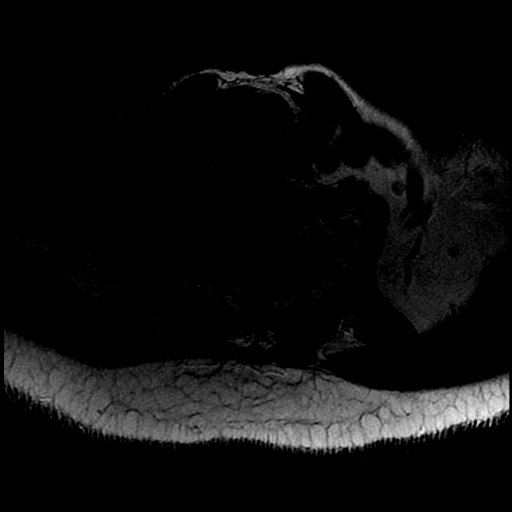
[im 14/31]
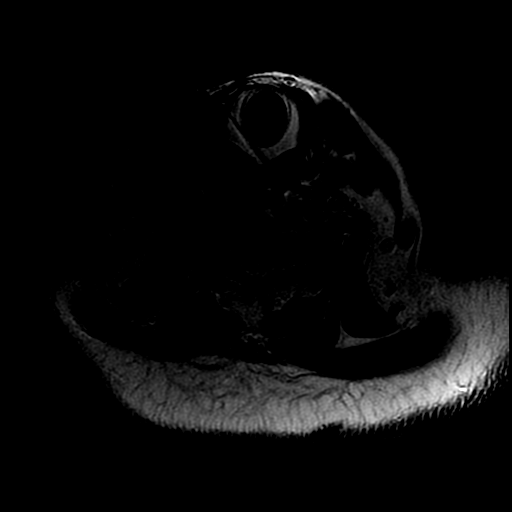
[im 17/31]
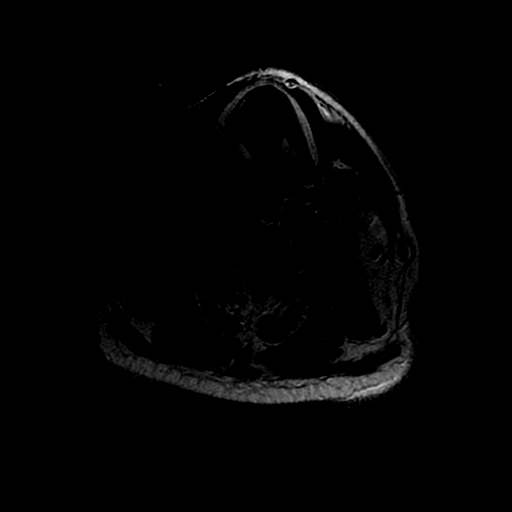
[im 21/31]
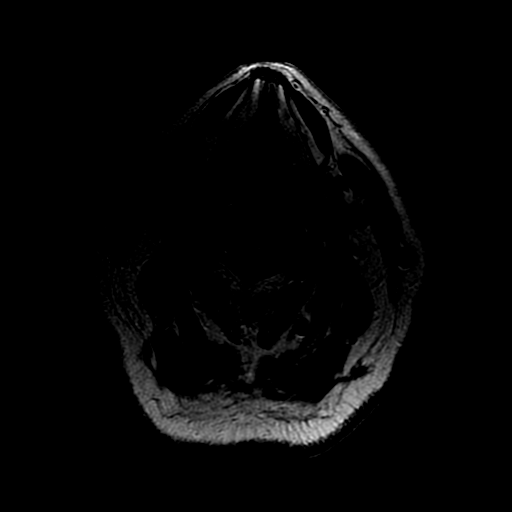
[im 26/31]
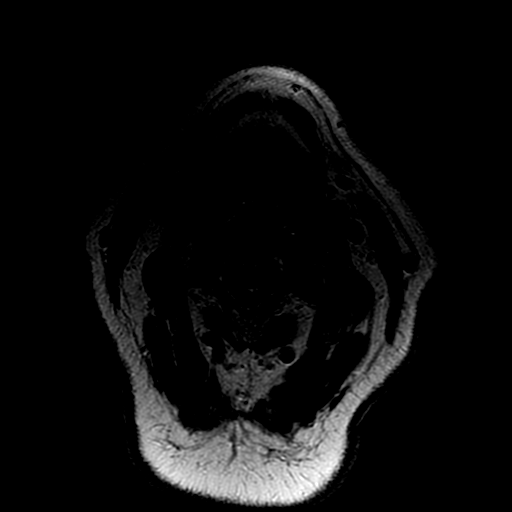
[im 31/31]
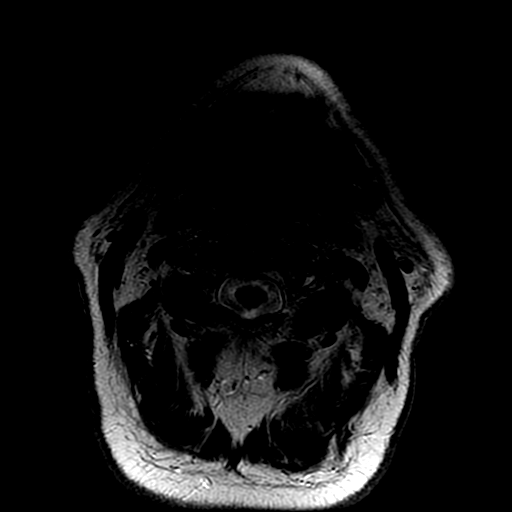

[Series 10: T1 · sagittal · 3.0mm · 0.39mm/px · 3 of 12 slices shown (2 of 2)]
[im 1/12]
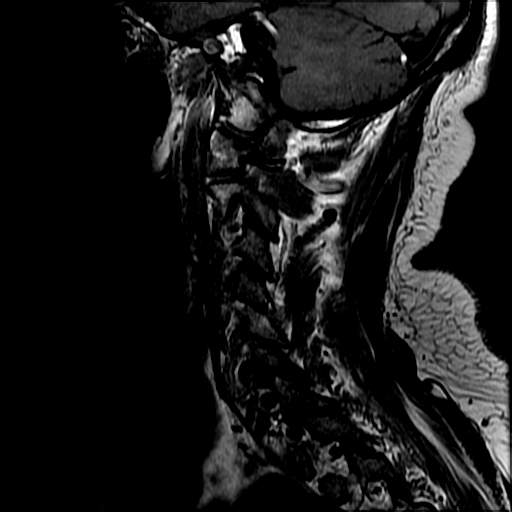
[im 6/12]
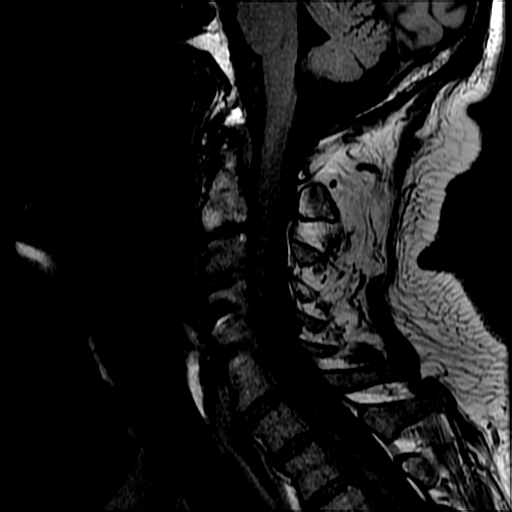
[im 12/12]
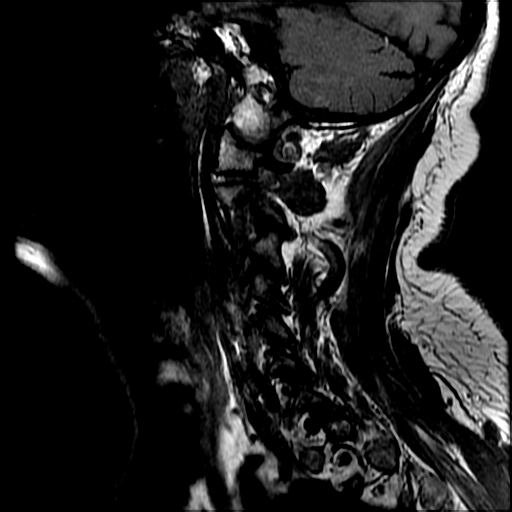

[19 of 48 positions shown; findings below may reference images not displayed]

FINDINGS: Alignment: Physiologic.

Vertebrae: No fracture, evidence of discitis, or bone lesion.

Cord: Normal signal and morphology.  Posterior columns are normal.

Posterior Fossa, vertebral arteries, paraspinal tissues:
Unremarkable.

Disc levels:

C2-3: Annular bulge. Disc space narrowing. Facet arthropathy. No
impingement.

C3-4: Shallow central protrusion. Disc space narrowing. Facet
arthropathy. BILATERAL C4 foraminal narrowing appears worse on the
LEFT.

C4-5: Status post ACDF. Solid appearing arthrodesis. No impingement.
Susceptibility artifact from plate and screws.

C5-6: Status post ACDF. Solid appearing arthrodesis. No impingement.

C6-7: Annular bulge. Facet arthropathy. No impingement. Prominent
anterior disc herniation projects into the retropharyngeal soft
tissues, doubtful clinical significance.

C7-T1:  Facet arthropathy.  No impingement.
IMPRESSION: Unremarkable appearing C4-C6 ACDF.

Adjacent segment disease at C3-4 with shallow central protrusion and
facet arthropathy. BILATERAL C4 foraminal narrowing appears worse on
the LEFT.

No significant spinal stenosis or cord compression.

No intrinsic cord abnormality is observed with regard to the
question of proprioception defects.

## 2019-01-18 NOTE — Patient Outreach (Signed)
Received a referral from Dr. Antony Contras Patient has [employer] insurance, This referral has been transferred to [employer] CM through email address [email or web address] per workflow.

## 2019-01-31 DIAGNOSIS — U071 COVID-19: Secondary | ICD-10-CM | POA: Diagnosis not present

## 2019-02-07 ENCOUNTER — Institutional Professional Consult (permissible substitution): Payer: PPO | Admitting: Neurology

## 2019-02-07 DIAGNOSIS — U071 COVID-19: Secondary | ICD-10-CM | POA: Diagnosis not present

## 2019-02-09 DIAGNOSIS — U071 COVID-19: Secondary | ICD-10-CM | POA: Diagnosis not present

## 2019-03-10 DIAGNOSIS — E119 Type 2 diabetes mellitus without complications: Secondary | ICD-10-CM | POA: Diagnosis not present

## 2019-03-10 DIAGNOSIS — J439 Emphysema, unspecified: Secondary | ICD-10-CM | POA: Diagnosis not present

## 2019-03-10 DIAGNOSIS — I10 Essential (primary) hypertension: Secondary | ICD-10-CM | POA: Diagnosis not present

## 2019-03-10 DIAGNOSIS — E1169 Type 2 diabetes mellitus with other specified complication: Secondary | ICD-10-CM | POA: Diagnosis not present

## 2019-03-10 DIAGNOSIS — M8580 Other specified disorders of bone density and structure, unspecified site: Secondary | ICD-10-CM | POA: Diagnosis not present

## 2019-03-10 DIAGNOSIS — E782 Mixed hyperlipidemia: Secondary | ICD-10-CM | POA: Diagnosis not present

## 2019-03-17 DIAGNOSIS — I10 Essential (primary) hypertension: Secondary | ICD-10-CM | POA: Diagnosis not present

## 2019-03-17 DIAGNOSIS — Z0000 Encounter for general adult medical examination without abnormal findings: Secondary | ICD-10-CM | POA: Diagnosis not present

## 2019-03-17 DIAGNOSIS — E1169 Type 2 diabetes mellitus with other specified complication: Secondary | ICD-10-CM | POA: Diagnosis not present

## 2019-03-17 DIAGNOSIS — J309 Allergic rhinitis, unspecified: Secondary | ICD-10-CM | POA: Diagnosis not present

## 2019-03-17 DIAGNOSIS — N529 Male erectile dysfunction, unspecified: Secondary | ICD-10-CM | POA: Diagnosis not present

## 2019-03-17 DIAGNOSIS — I6930 Unspecified sequelae of cerebral infarction: Secondary | ICD-10-CM | POA: Diagnosis not present

## 2019-03-17 DIAGNOSIS — R413 Other amnesia: Secondary | ICD-10-CM | POA: Diagnosis not present

## 2019-03-17 DIAGNOSIS — J439 Emphysema, unspecified: Secondary | ICD-10-CM | POA: Diagnosis not present

## 2019-03-17 DIAGNOSIS — E782 Mixed hyperlipidemia: Secondary | ICD-10-CM | POA: Diagnosis not present

## 2019-03-17 DIAGNOSIS — Z1211 Encounter for screening for malignant neoplasm of colon: Secondary | ICD-10-CM | POA: Diagnosis not present

## 2019-03-21 ENCOUNTER — Other Ambulatory Visit: Payer: Self-pay

## 2019-03-21 ENCOUNTER — Encounter: Payer: Self-pay | Admitting: Neurology

## 2019-03-21 ENCOUNTER — Ambulatory Visit: Payer: PPO | Admitting: Neurology

## 2019-03-21 VITALS — BP 136/95 | HR 76 | Temp 96.9°F | Ht 67.0 in | Wt 178.4 lb

## 2019-03-21 DIAGNOSIS — R413 Other amnesia: Secondary | ICD-10-CM | POA: Diagnosis not present

## 2019-03-21 DIAGNOSIS — G3184 Mild cognitive impairment, so stated: Secondary | ICD-10-CM

## 2019-03-21 DIAGNOSIS — I6930 Unspecified sequelae of cerebral infarction: Secondary | ICD-10-CM | POA: Diagnosis not present

## 2019-03-21 NOTE — Progress Notes (Signed)
[facility] [address] [place]. [place] [address] [phone]       OFFICE CONSULT NOTE  [name] Date of Birth:  1940-07-12 Medical Record Number:  [id number]   Referring MD: Antony Contras  Reason for Referral: Memory loss and history of stroke  HPI: Timothy Brewer is a pleasant 79 year old Caucasian male seen today for initial office consultation visit for memory loss.  History is obtained from the patient as well as his lady friend Timothy Brewer is accompanying him as well as referral notes and review of electronic medical records and imaging films personally in PACS.  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  He was seen by me in the hospital but did not follow-up in the office.  He is remained on aspirin 325 mg daily and has been tolerating it well without side effects.  He for the last 1 year has noticed cognitive decline in short-term memory difficulties.  He has noted decreased concentration, ability to do his finances, calculate as well as diminished attention and multitasking.  Is also noticed difficulty in playing the piano and his fingers do not coordinate as well.  Is still lives alone and is independent in activities of daily living.  He denies any headache, loss of consciousness, significant head injury, seizures.  He has had no further recurrent stroke or TIA symptoms.  He is not had any recent brain imaging study or any lab work for reversible causes of cognitive involvement checked.  He denies any family history of Alzheimer's.  ROS:   14 system review of systems is positive for memory loss, decreased concentration, decreased calculation, decreased attention, multitasking, and coordination in all other systems negative PMH:    Past Medical History:  Diagnosis Date  . Arthritis   . Diabetes mellitus without complication ([place])   . Hypertension   . Pre-diabetes    on metformin  . Stroke [facility])     Social History:  Social History   Socioeconomic History  . Marital status: Widowed    Spouse name: Not on file  . Number of children: Not on file  . Years of education: Not on file  . Highest education level: Not on file  Occupational History  . Not on file  Tobacco Use  . Smoking status: Former Smoker    Types: Cigarettes    Quit date: 07/09/2014    Years since quitting: 4.7  . Smokeless tobacco: Never Used  . Tobacco comment: 8-10 a day  Substance and Sexual Activity  . Alcohol use: Yes    Alcohol/week: 1.0 standard drinks    Types: 1 Glasses of wine per week    Comment: socially  . Drug use: No  . Sexual activity: Yes  Other Topics Concern  . Not on file  Social History Narrative  . Not on file   Social Determinants of Health   Financial Resource Strain:   . Difficulty of Paying Living Expenses: Not on file  Food Insecurity:   . Worried About Charity fundraiser in the Last Year: Not on file  . Ran Out of Food in the Last Year: Not on file  Transportation Needs:   . Lack of Transportation (Medical): Not on file  . Lack of Transportation (Non-Medical): Not on file  Physical Activity:   . Days of Exercise per Week: Not on file  . Minutes of Exercise per Session: Not on file  Stress:   . Feeling of Stress : Not on file  Social Connections:   . Frequency of Communication with Friends and Family: Not on file  . Frequency of Social Gatherings with Friends and Family: Not on file  . Attends Religious Services: Not on file  . Active Member of Clubs or Organizations: Not on file  . Attends Archivist Meetings: Not on file  . Marital Status: Not on file  Intimate Partner Violence:   . Fear of Current or Ex-Partner: Not on file  . Emotionally Abused: Not on file  . Physically Abused:  Not on file  . Sexually Abused: Not on file    Medications:   Current Outpatient Medications on File Prior to Visit  Medication Sig Dispense Refill  . AMOXICILLIN PO Take 500 mg by mouth. For dental work    . aspirin EC 325 MG tablet Take 1 tablet (325 mg total) by mouth daily. 30 tablet 0  . atorvastatin (LIPITOR) 20 MG tablet Take 1 tablet (20 mg total) by mouth daily at 6 PM. 30 tablet 0  . Calcium Carb-Cholecalciferol (CALCIUM 600 + D PO) Take 1 tablet by mouth daily.     . cetirizine (KLS ALLER-TEC) 10 MG tablet Take 10 mg by mouth daily.    . ferrous sulfate (FERROUSUL) 325 (65 FE) MG tablet Take 1 tablet (325 mg total) by mouth 3 (three) times daily with meals.  3  . fexofenadine (ALLEGRA) 180 MG tablet Take 180 mg by mouth every evening.     . metFORMIN (GLUCOPHAGE-XR) 500 MG 24 hr tablet Take 500 mg by mouth 2 (two) times daily.     . Multiple Vitamin (MULTIVITAMIN ADULT PO) multivitamin    . Multiple Vitamins-Minerals (PRESERVISION AREDS PO) Take by mouth 2 (two) times daily.    Marland Kitchen omeprazole (PRILOSEC) 20 MG capsule omeprazole 20 mg capsule,delayed release  TAKE 1 CAPSULE BY MOUTH TWICE DAILY    . polyethylene glycol (MIRALAX / GLYCOLAX) packet Take 17 g by mouth 2 (two) times daily. (Patient taking differently: Take 17 g by mouth daily as needed for mild constipation. ) 14 each 0  . sildenafil (REVATIO) 20 MG tablet Take 40-100 mg by mouth daily as needed.    . valsartan (DIOVAN) 80 MG tablet Take 80 mg by mouth daily.     No current facility-administered medications on file prior to visit.    Allergies:   Allergies  Allergen Reactions  . Iodine Hives and Other (See Comments)    Internal only    Physical Exam General: Pleasant elderly Caucasian male, seated, in no evident distress Head: head normocephalic and atraumatic.   Neck: supple with no carotid or supraclavicular bruits Cardiovascular: regular rate and rhythm, no murmurs Musculoskeletal: no deformity Skin:  no  rash/petichiae Vascular:  Normal pulses all extremities  Neurologic Exam Mental Status: Awake and fully alert. Oriented to place and time. Recent and remote memory intact. Attention span, concentration and fund of knowledge appropriate. Mood and affect appropriate.  Recall 3/3.  Able to name 12 animals which can walk on 4 legs.  Clock drawing 4/4. Cranial Nerves: Fundoscopic exam reveals sharp disc margins. Pupils equal, briskly reactive to light. Extraocular movements full without nystagmus. Visual fields full to confrontation. Hearing intact. Facial sensation intact. Face, tongue, palate moves normally and symmetrically.  Motor: Normal bulk and  tone. Normal strength in all tested extremity muscles. Sensory.: intact to touch , pinprick , position and vibratory sensation.  Coordination: Rapid alternating movements normal in all extremities. Finger-to-nose and heel-to-shin performed accurately bilaterally. Gait and Station: Arises from chair without difficulty. Stance is normal. Gait demonstrates normal stride length and balance . Able to heel, toe and tandem walk with moderate difficulty.  Reflexes: 1+ and symmetric. Toes downgoing.   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  Vascular risk factors of hypertension hyperlipidemia diabetes age and cerebrovascular disease.     PLAN: I had a long discussion with the patient and his lady friend Timothy Brewer regarding his memory and cognitive difficulties which likely represent age-appropriate mild cognitive impairment rather than Alzheimer's which Timothy Brewer is concerned about.  I recommend further evaluation by checking lab work for reversible causes of memory loss, EEG and MRI scan of the brain.  I encouraged the patient to increase  participation in cognitively challenging activities like solving crossword puzzles, playing bridge and sudoku.  We also discussed memory compensation strategies.  He can also start taking memory enhancement health food supplements like provision 1 tablet daily.  He was advised to continue aspirin for stroke prevention and maintain aggressive risk factor modification with strict control of hypertension with blood pressure goal below 140/90, lipids with LDL cholesterol goal below 70 mg percent and diabetes with hemoglobin A1c goal below 6.5%.  Greater than 50% time during this 45-minute consultation visit was spent on counseling and coordination of care about his stroke and memory loss and cognitive impairment and answering questions.  He will return for follow-up in the future in 3 months or call earlier if necessary. Antony Contras, MD  [facility] [address] [place], [place] [address]  Phone [phone] Fax [phone]  Note: This document was prepared with digital dictation and possible smart phrase technology. Any transcriptional errors that result from this process are unintentional.

## 2019-03-21 NOTE — Patient Instructions (Signed)
I had a long discussion with the patient and his lady friend Verdis Frederickson regarding his memory and cognitive difficulties which likely represent age-appropriate mild cognitive impairment rather than Alzheimer's which she is concerned about.  I recommend further evaluation by checking lab work for reversible causes of memory loss, EEG and MRI scan of the brain.  I encouraged the patient to increase participation in cognitively challenging activities like solving crossword puzzles, playing bridge and sudoku.  We also discussed memory compensation strategies.  He can also start taking memory enhancement health food supplements like provision 1 tablet daily.  He was advised to continue aspirin for stroke prevention and maintain aggressive risk factor modification with strict control of hypertension with blood pressure goal below 140/90, lipids with LDL cholesterol goal below 70 mg percent and diabetes with hemoglobin A1c goal below 6.5%.  He will return for follow-up in the future in 3 months or call earlier if necessary. Memory Compensation Strategies  1. Use "WARM" strategy.  W= write it down  A= associate it  R= repeat it  M= make a mental note  2.   You can keep a Social worker.  Use a 3-ring notebook with sections for the following: calendar, important names and phone numbers,  medications, doctors' names/phone numbers, lists/reminders, and a section to journal what you did  each day.   3.    Use a calendar to write appointments down.  4.    Write yourself a schedule for the day.  This can be placed on the calendar or in a separate section of the Memory Notebook.  Keeping a  regular schedule can help memory.  5.    Use medication organizer with sections for each day or morning/evening pills.  You may need help loading it  6.    Keep a basket, or pegboard by the door.  Place items that you need to take out with you in the basket or on the pegboard.  You may also want to  include a message board for  reminders.  7.    Use sticky notes.  Place sticky notes with reminders in a place where the task is performed.  For example: " turn off the  stove" placed by the stove, "lock the door" placed on the door at eye level, " take your medications" on  the bathroom mirror or by the place where you normally take your medications.  8.    Use alarms/timers.  Use while cooking to remind yourself to check on food or as a reminder to take your medicine, or as a  reminder to make a call, or as a reminder to perform another task, etc.

## 2019-03-22 LAB — DEMENTIA PANEL
Homocysteine: 13.9 umol/L (ref 0.0–19.2)
RPR Ser Ql: NONREACTIVE
TSH: 2.11 u[IU]/mL (ref 0.450–4.500)
Vitamin B-12: 932 pg/mL (ref 232–1245)

## 2019-03-23 ENCOUNTER — Other Ambulatory Visit: Payer: Self-pay

## 2019-03-23 ENCOUNTER — Ambulatory Visit: Payer: PPO | Admitting: Neurology

## 2019-03-23 DIAGNOSIS — R410 Disorientation, unspecified: Secondary | ICD-10-CM

## 2019-03-23 DIAGNOSIS — R413 Other amnesia: Secondary | ICD-10-CM

## 2019-03-23 NOTE — Progress Notes (Signed)
Kindly inform the patient that lab work for reversible causes of memory loss was normal

## 2019-03-25 DIAGNOSIS — Z794 Long term (current) use of insulin: Secondary | ICD-10-CM | POA: Diagnosis not present

## 2019-03-25 DIAGNOSIS — E782 Mixed hyperlipidemia: Secondary | ICD-10-CM | POA: Diagnosis not present

## 2019-03-25 DIAGNOSIS — E1169 Type 2 diabetes mellitus with other specified complication: Secondary | ICD-10-CM | POA: Diagnosis not present

## 2019-03-27 DIAGNOSIS — Z1211 Encounter for screening for malignant neoplasm of colon: Secondary | ICD-10-CM | POA: Diagnosis not present

## 2019-03-28 ENCOUNTER — Telehealth: Payer: Self-pay | Admitting: *Deleted

## 2019-03-28 NOTE — Telephone Encounter (Signed)
-----   Message from Garvin Fila, MD sent at 03/25/2019  9:01 AM EST ----- Kindly inform patient that EEG study was normal

## 2019-03-31 ENCOUNTER — Other Ambulatory Visit: Payer: Self-pay

## 2019-03-31 ENCOUNTER — Ambulatory Visit
Admission: RE | Admit: 2019-03-31 | Discharge: 2019-03-31 | Disposition: A | Discharge status: Home / Self Care | Payer: PPO | Source: Ambulatory Visit | Attending: Neurology | Admitting: Neurology

## 2019-03-31 DIAGNOSIS — R413 Other amnesia: Secondary | ICD-10-CM | POA: Diagnosis not present

## 2019-03-31 MED ORDER — GADOBENATE DIMEGLUMINE 529 MG/ML IV SOLN
16.0000 mL | Freq: Once | INTRAVENOUS | Status: AC | PRN
  Administered 2019-03-31: 16 mL via INTRAVENOUS

## 2019-04-08 ENCOUNTER — Other Ambulatory Visit: Payer: PPO

## 2019-05-06 DIAGNOSIS — N5201 Erectile dysfunction due to arterial insufficiency: Secondary | ICD-10-CM | POA: Diagnosis not present

## 2019-05-06 DIAGNOSIS — R3912 Poor urinary stream: Secondary | ICD-10-CM | POA: Diagnosis not present

## 2019-05-06 DIAGNOSIS — N401 Enlarged prostate with lower urinary tract symptoms: Secondary | ICD-10-CM | POA: Diagnosis not present

## 2019-05-17 DIAGNOSIS — H353122 Nonexudative age-related macular degeneration, left eye, intermediate dry stage: Secondary | ICD-10-CM | POA: Diagnosis not present

## 2019-05-17 DIAGNOSIS — H353112 Nonexudative age-related macular degeneration, right eye, intermediate dry stage: Secondary | ICD-10-CM | POA: Diagnosis not present

## 2019-05-17 DIAGNOSIS — E119 Type 2 diabetes mellitus without complications: Secondary | ICD-10-CM | POA: Diagnosis not present

## 2019-05-17 DIAGNOSIS — H35371 Puckering of macula, right eye: Secondary | ICD-10-CM | POA: Diagnosis not present

## 2019-05-20 DIAGNOSIS — Z03818 Encounter for observation for suspected exposure to other biological agents ruled out: Secondary | ICD-10-CM | POA: Diagnosis not present

## 2019-05-27 DIAGNOSIS — E782 Mixed hyperlipidemia: Secondary | ICD-10-CM | POA: Diagnosis not present

## 2019-05-27 DIAGNOSIS — M8580 Other specified disorders of bone density and structure, unspecified site: Secondary | ICD-10-CM | POA: Diagnosis not present

## 2019-05-27 DIAGNOSIS — I10 Essential (primary) hypertension: Secondary | ICD-10-CM | POA: Diagnosis not present

## 2019-05-27 DIAGNOSIS — E119 Type 2 diabetes mellitus without complications: Secondary | ICD-10-CM | POA: Diagnosis not present

## 2019-05-27 DIAGNOSIS — J439 Emphysema, unspecified: Secondary | ICD-10-CM | POA: Diagnosis not present

## 2019-05-27 DIAGNOSIS — E1169 Type 2 diabetes mellitus with other specified complication: Secondary | ICD-10-CM | POA: Diagnosis not present

## 2019-06-22 DIAGNOSIS — N5201 Erectile dysfunction due to arterial insufficiency: Secondary | ICD-10-CM | POA: Diagnosis not present

## 2019-06-22 DIAGNOSIS — R350 Frequency of micturition: Secondary | ICD-10-CM | POA: Diagnosis not present

## 2019-06-22 DIAGNOSIS — R3915 Urgency of urination: Secondary | ICD-10-CM | POA: Diagnosis not present

## 2019-06-22 DIAGNOSIS — N401 Enlarged prostate with lower urinary tract symptoms: Secondary | ICD-10-CM | POA: Diagnosis not present

## 2019-06-27 ENCOUNTER — Other Ambulatory Visit: Payer: Self-pay

## 2019-06-27 ENCOUNTER — Encounter: Payer: Self-pay | Admitting: Neurology

## 2019-06-27 ENCOUNTER — Ambulatory Visit: Payer: PPO | Admitting: Neurology

## 2019-06-27 VITALS — BP 120/60 | HR 88 | Temp 97.5°F | Wt 180.2 lb

## 2019-06-27 DIAGNOSIS — G3184 Mild cognitive impairment, so stated: Secondary | ICD-10-CM | POA: Diagnosis not present

## 2019-06-27 NOTE — Progress Notes (Signed)
[facility] [address] [place]. [place] [address] [phone]       OFFICE FOLLOW UP VISIT NOTE  [name] Date of Birth:  Oct 14, 1940 Medical Record Number:  [id number]   Referring MD: Antony Contras  Reason for Referral: Memory loss and history of stroke  HPI: Initial consult 03/21/2019 Timothy Brewer is a pleasant 79 year old Caucasian male seen today for initial office consultation visit for memory loss.  History is obtained from the patient as well as his lady friend Verdis Frederickson is accompanying him as well as referral notes and review of electronic medical records and imaging films personally in PACS.  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  He was seen by me in the hospital but did not follow-up in the office.  He is remained on aspirin 325 mg daily and has been tolerating it well without side effects.  He for the last 1 year has noticed cognitive decline in short-term memory difficulties.  He has noted decreased concentration, ability to do his finances, calculate as well as diminished attention and multitasking.  Is also noticed difficulty in playing the piano and his fingers do not coordinate as well.  Is still lives alone and is independent in activities of daily living.  He denies any headache, loss of consciousness, significant head injury, seizures.  He has had no further recurrent stroke or TIA symptoms.  He is not had any recent brain imaging study or any lab work for reversible causes of cognitive involvement checked.  He denies any family history of Alzheimer's. Update 06/27/2019 : He returns for follow-up after last visit 3 months ago.  He states his memory difficulties about the same.  They are not significantly getting worse.  He  has not been participating in any cognitively challenging activities..  He has mostly issues with short-term memory but can remember all things quite well.  He had lab work done for reversible causes of memory loss at last visit on 03/21/2019 which included vitamin B12, TSH, RPR and homocystine all of which were normal.  EEG done on 03/24/2019 was also normal.  MRI scan of the brain done on 03/31/2019 showed mild changes of small vessel disease and was overall unchanged compared to CT head from 06/08/2017.  Today on testing in the office his recall was 3/3 and he was able to name 12 animals which can walk on 4 legs and clock drawing score was 4/4.  No new complaints today. ROS:   14 system review of systems is positive for memory loss, decreased concentration, decreased calculation, decreased attention, multitasking, and memory difficulties and all other systems negative PMH:  Past Medical History:  Diagnosis Date  . Arthritis   . Diabetes mellitus without complication ([place])   . Hypertension   . Pre-diabetes    on metformin  . Stroke [facility])     Social History:  Social History   Socioeconomic History  . Marital status: Widowed    Spouse name: Not on file  . Number of children: Not on file  . Years of education: Not on file  . Highest education level: Not on file  Occupational History  . Not on file  Tobacco Use  . Smoking status: Former Smoker    Types: Cigarettes    Quit  date: 07/09/2014    Years since quitting: 4.9  . Smokeless tobacco: Never Used  . Tobacco comment: 8-10 a day  Substance and Sexual Activity  . Alcohol use: Yes    Alcohol/week: 1.0 standard drinks    Types: 1 Glasses of wine per week    Comment: socially  . Drug use: No  . Sexual activity: Yes  Other Topics Concern  . Not on file  Social History Narrative  . Not on file   Social Determinants of Health   Financial Resource Strain:   . Difficulty of Paying Living Expenses:   Food Insecurity:   . Worried About  Charity fundraiser in the Last Year:   . Arboriculturist in the Last Year:   Transportation Needs:   . Film/video editor (Medical):   Marland Kitchen Lack of Transportation (Non-Medical):   Physical Activity:   . Days of Exercise per Week:   . Minutes of Exercise per Session:   Stress:   . Feeling of Stress :   Social Connections:   . Frequency of Communication with Friends and Family:   . Frequency of Social Gatherings with Friends and Family:   . Attends Religious Services:   . Active Member of Clubs or Organizations:   . Attends Archivist Meetings:   Marland Kitchen Marital Status:   Intimate Partner Violence:   . Fear of Current or Ex-Partner:   . Emotionally Abused:   Marland Kitchen Physically Abused:   . Sexually Abused:     Medications:   Current Outpatient Medications on File Prior to Visit  Medication Sig Dispense Refill  . AMOXICILLIN PO Take 500 mg by mouth. For dental work    . aspirin EC 325 MG tablet Take 1 tablet (325 mg total) by mouth daily. 30 tablet 0  . atorvastatin (LIPITOR) 20 MG tablet Take 1 tablet (20 mg total) by mouth daily at 6 PM. 30 tablet 0  . Calcium Carb-Cholecalciferol (CALCIUM 600 + D PO) Take 1 tablet by mouth daily.     . cetirizine (KLS ALLER-TEC) 10 MG tablet Take 10 mg by mouth daily.    . ferrous sulfate (FERROUSUL) 325 (65 FE) MG tablet Take 1 tablet (325 mg total) by mouth 3 (three) times daily with meals.  3  . fexofenadine (ALLEGRA) 180 MG tablet Take 180 mg by mouth every evening.     . metFORMIN (GLUCOPHAGE-XR) 500 MG 24 hr tablet Take 500 mg by mouth 2 (two) times daily.     . Multiple Vitamin (MULTIVITAMIN ADULT PO) multivitamin    . Multiple Vitamins-Minerals (PRESERVISION AREDS PO) Take by mouth 2 (two) times daily.    Marland Kitchen omeprazole (PRILOSEC) 20 MG capsule omeprazole 20 mg capsule,delayed release  TAKE 1 CAPSULE BY MOUTH TWICE DAILY    . polyethylene glycol (MIRALAX / GLYCOLAX) packet Take 17 g by mouth 2 (two) times daily. (Patient taking  differently: Take 17 g by mouth daily as needed for mild constipation. ) 14 each 0  . sildenafil (REVATIO) 20 MG tablet Take 40-100 mg by mouth daily as needed.    . valsartan (DIOVAN) 80 MG tablet Take 80 mg by mouth daily.     No current facility-administered medications on file prior to visit.    Allergies:   Allergies  Allergen Reactions  . Iodine Hives and Other (See Comments)    Internal only    Physical Exam General: Pleasant elderly Caucasian male, seated, in no evident distress Head: head normocephalic  and atraumatic.   Neck: supple with no carotid or supraclavicular bruits Cardiovascular: regular rate and rhythm, no murmurs Musculoskeletal: no deformity Skin:  no rash/petichiae Vascular:  Normal pulses all extremities  Neurologic Exam Mental Status: Awake and fully alert. Oriented to place and time. Recent and remote memory intact. Attention span, concentration and fund of knowledge appropriate. Mood and affect appropriate.  Recall 3/3.  Able to name 12 animals which can walk on 4 legs.  Clock drawing 4/4. Cranial Nerves: Fundoscopic exam reveals sharp disc margins. Pupils equal, briskly reactive to light. Extraocular movements full without nystagmus. Visual fields full to confrontation. Hearing intact. Facial sensation intact. Face, tongue, palate moves normally and symmetrically.  Motor: Normal bulk and tone. Normal strength in all tested extremity muscles. Sensory.: intact to touch , pinprick , position and vibratory sensation.  Coordination: Rapid alternating movements normal in all extremities. Finger-to-nose and heel-to-shin performed accurately bilaterally. Gait and Station: Arises from chair without difficulty. Stance is normal. Gait demonstrates normal stride length and balance . Able to heel, toe and tandem walk with moderate difficulty.  Reflexes: 1+ and symmetric. Toes downgoing.       ASSESSMENT: 79 year old Caucasian male with 1 year history of short-term  memory and cognitive difficulties likely due to mild cognitive impairment with remote history of left thalamic lacunar infarct in  2019 and prior silent lacunar infarcts.  Vascular risk factors of hypertension hyperlipidemia diabetes age and cerebrovascular disease.     PLAN: I had a long discussion with the patient about his mild cognitive impairment and memory difficulties which appear to be stable.  I discussed the results of his lab work, EEG and MRI scan and answered questions.  I recommend he continue participating in cognitively challenging activities like solving crossword puzzles, playing bridge and sudoku.  We also discussed memory compensation strategies.  He will return for follow-up in the future in 6 months or call earlier if necessary. Greater than 50% time during this 25-minute  visit was spent on counseling and coordination of care about his stroke and memory loss and cognitive impairment and answering questions.    Antony Contras, MD  [facility] [address] [place], [place] [address]  Phone [phone] Fax [phone]  Note: This document was prepared with digital dictation and possible smart phrase technology. Any transcriptional errors that result from this process are unintentional.

## 2019-06-27 NOTE — Patient Instructions (Signed)
I had a long discussion with the patient about his mild cognitive impairment and memory difficulties which appear to be stable.  I discussed the results of his lab work, EEG and MRI scan and answered questions.  I recommend he continue participating in cognitively challenging activities like solving crossword puzzles, playing bridge and sudoku.  We also discussed memory compensation strategies.  He will return for follow-up in the future in 6 months or call earlier if necessary. Memory Compensation Strategies  1. Use "WARM" strategy.  W= write it down  A= associate it  R= repeat it  M= make a mental note  2.   You can keep a Social worker.  Use a 3-ring notebook with sections for the following: calendar, important names and phone numbers,  medications, doctors' names/phone numbers, lists/reminders, and a section to journal what you did  each day.   3.    Use a calendar to write appointments down.  4.    Write yourself a schedule for the day.  This can be placed on the calendar or in a separate section of the Memory Notebook.  Keeping a  regular schedule can help memory.  5.    Use medication organizer with sections for each day or morning/evening pills.  You may need help loading it  6.    Keep a basket, or pegboard by the door.  Place items that you need to take out with you in the basket or on the pegboard.  You may also want to  include a message board for reminders.  7.    Use sticky notes.  Place sticky notes with reminders in a place where the task is performed.  For example: " turn off the  stove" placed by the stove, "lock the door" placed on the door at eye level, " take your medications" on  the bathroom mirror or by the place where you normally take your medications.  8.    Use alarms/timers.  Use while cooking to remind yourself to check on food or as a reminder to take your medicine, or as a  reminder to make a call, or as a reminder to perform another task, etc.

## 2019-07-05 DIAGNOSIS — E782 Mixed hyperlipidemia: Secondary | ICD-10-CM | POA: Diagnosis not present

## 2019-07-05 DIAGNOSIS — E119 Type 2 diabetes mellitus without complications: Secondary | ICD-10-CM | POA: Diagnosis not present

## 2019-07-05 DIAGNOSIS — M8580 Other specified disorders of bone density and structure, unspecified site: Secondary | ICD-10-CM | POA: Diagnosis not present

## 2019-07-05 DIAGNOSIS — J439 Emphysema, unspecified: Secondary | ICD-10-CM | POA: Diagnosis not present

## 2019-07-05 DIAGNOSIS — I10 Essential (primary) hypertension: Secondary | ICD-10-CM | POA: Diagnosis not present

## 2019-07-05 DIAGNOSIS — E1169 Type 2 diabetes mellitus with other specified complication: Secondary | ICD-10-CM | POA: Diagnosis not present

## 2019-10-03 DIAGNOSIS — M545 Low back pain: Secondary | ICD-10-CM | POA: Diagnosis not present

## 2019-10-04 DIAGNOSIS — M8580 Other specified disorders of bone density and structure, unspecified site: Secondary | ICD-10-CM | POA: Diagnosis not present

## 2019-10-04 DIAGNOSIS — E119 Type 2 diabetes mellitus without complications: Secondary | ICD-10-CM | POA: Diagnosis not present

## 2019-10-04 DIAGNOSIS — J439 Emphysema, unspecified: Secondary | ICD-10-CM | POA: Diagnosis not present

## 2019-10-04 DIAGNOSIS — I10 Essential (primary) hypertension: Secondary | ICD-10-CM | POA: Diagnosis not present

## 2019-10-04 DIAGNOSIS — E1169 Type 2 diabetes mellitus with other specified complication: Secondary | ICD-10-CM | POA: Diagnosis not present

## 2019-10-04 DIAGNOSIS — E782 Mixed hyperlipidemia: Secondary | ICD-10-CM | POA: Diagnosis not present

## 2019-11-22 DIAGNOSIS — H353112 Nonexudative age-related macular degeneration, right eye, intermediate dry stage: Secondary | ICD-10-CM | POA: Diagnosis not present

## 2019-11-22 DIAGNOSIS — H43812 Vitreous degeneration, left eye: Secondary | ICD-10-CM | POA: Diagnosis not present

## 2019-11-22 DIAGNOSIS — H353122 Nonexudative age-related macular degeneration, left eye, intermediate dry stage: Secondary | ICD-10-CM | POA: Diagnosis not present

## 2019-11-22 DIAGNOSIS — E119 Type 2 diabetes mellitus without complications: Secondary | ICD-10-CM | POA: Diagnosis not present

## 2019-12-27 ENCOUNTER — Ambulatory Visit: Payer: PPO | Admitting: Neurology

## 2019-12-27 ENCOUNTER — Encounter: Payer: Self-pay | Admitting: Neurology

## 2019-12-27 VITALS — BP 133/65 | HR 94 | Ht 67.0 in | Wt 183.2 lb

## 2019-12-27 DIAGNOSIS — G3184 Mild cognitive impairment, so stated: Secondary | ICD-10-CM | POA: Diagnosis not present

## 2019-12-27 DIAGNOSIS — I6529 Occlusion and stenosis of unspecified carotid artery: Secondary | ICD-10-CM

## 2019-12-27 NOTE — Patient Instructions (Signed)
I had a long discussion with the patient regarding his mild cognitive impairment as well as remote history of stroke and he appears to be stable from both standpoint.  Continue provision for cognitive impairment and participation in cognitively challenging activities like solving crossword puzzles, playing bridge and sodoku.  Continue aspirin for stroke prevention with aggressive risk factor modification with strict control of hypertension with blood pressure goal below 130/90, diabetes with hemoglobin A1c goal below 6.5% and lipids with LDL cholesterol goal below 70 mg percent.  Check screening follow-up carotid ultrasound study as he has not had it in more than 2 years.  He will return for follow-up in the future in a year or call earlier if necessary.

## 2019-12-27 NOTE — Progress Notes (Signed)
[facility] [address] [place]. [place] [address] [phone]       OFFICE FOLLOW UP VISIT NOTE  [name] Date of Birth:  [birth year] Medical Record Number:  [id number]   Referring MD: Timothy Brewer  Reason for Referral: Memory loss and history of stroke  HPI: Initial consult 03/21/2019 [name] is a pleasant 79 year old Caucasian male seen today for initial office consultation visit for memory loss.  History is obtained from the patient as well as his lady friend Timothy Brewer is accompanying him as well as referral notes and review of electronic medical records and imaging films personally in PACS.  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  He was seen by me in the hospital but did not follow-up in the office.  He is remained on aspirin 325 mg daily and has been tolerating it well without side effects.  He for the last 1 year has noticed cognitive decline in short-term memory difficulties.  He has noted decreased concentration, ability to do his finances, calculate as well as diminished attention and multitasking.  Is also noticed difficulty in playing the piano and his fingers do not coordinate as well.  Is still lives alone and is independent in activities of daily living.  He denies any headache, loss of consciousness, significant head injury, seizures.  He has had no further recurrent stroke or TIA symptoms.  He is not had any recent brain imaging study or any lab work for reversible causes of cognitive involvement checked.  He denies any family history of Alzheimer's. Update 06/27/2019 : He returns for follow-up after last visit 3 months ago.  He states his memory difficulties about the same.  They are not significantly getting worse.  He  has not been participating in any cognitively challenging activities..  He has mostly issues with short-term memory but can remember all things quite well.  He had lab work done for reversible causes of memory loss at last visit on 03/21/2019 which included vitamin B12, TSH, RPR and homocystine all of which were normal.  EEG done on 03/24/2019 was also normal.  MRI scan of the brain done on 03/31/2019 showed mild changes of small vessel disease and was overall unchanged compared to CT head from 06/08/2017.  Today on testing in the office his recall was 3/3 and he was able to name 12 animals which can walk on 4 legs and clock drawing score was 4/4.  No new complaints today. Update 12/27/2019 : He returns for follow-up after last visit 6 months ago.  He states is doing well.  His memory and cognitive difficulties remain mostly unchanged to he has some good and bad days.  He does do daily crossword puzzles and is also increased socialization and interacting with friends and colleagues.  He has started taking Prevagen and every day which he thinks helps.  He remains on aspirin she is tolerating well without bruising or bleeding.  His blood pressures well controlled and today it is 133/65.  He has not had any recurrent stroke or TIA symptoms.  He was seen by his primary care physician 2 weeks ago and had no health issues.  He has no complaints today. ROS:   14 system review of systems is positive for memory loss, decreased concentration,  decreased calculation, decreased attention, multitasking, and memory difficulties and all other systems negative PMH:  Past Medical History:  Diagnosis Date  . Arthritis   . Diabetes mellitus without complication ([place])   . Hypertension   . Memory loss   . Pre-diabetes    on metformin  . Stroke [facility])     Social History:  Social History   Socioeconomic History  . Marital status: Widowed    Spouse name: Not on file  . Number of children: Not on file  . Years of education:  Not on file  . Highest education level: Not on file  Occupational History  . Occupation: retired  Tobacco Use  . Smoking status: Former Smoker    Types: Cigarettes    Quit date: 07/09/2014    Years since quitting: 5.4  . Smokeless tobacco: Never Used  . Tobacco comment: 8-10 a day  Vaping Use  . Vaping Use: Never used  Substance and Sexual Activity  . Alcohol use: Yes    Alcohol/week: 1.0 standard drink    Types: 1 Glasses of wine per week    Comment: socially  . Drug use: No  . Sexual activity: Yes  Other Topics Concern  . Not on file  Social History Narrative   Lives with friend   Right Handed   Drinks 2-3 cups caffeine daily   Social Determinants of Health   Financial Resource Strain:   . Difficulty of Paying Living Expenses: Not on file  Food Insecurity:   . Worried About Charity fundraiser in the Last Year: Not on file  . Ran Out of Food in the Last Year: Not on file  Transportation Needs:   . Lack of Transportation (Medical): Not on file  . Lack of Transportation (Non-Medical): Not on file  Physical Activity:   . Days of Exercise per Week: Not on file  . Minutes of Exercise per Session: Not on file  Stress:   . Feeling of Stress : Not on file  Social Connections:   . Frequency of Communication with Friends and Family: Not on file  . Frequency of Social Gatherings with Friends and Family: Not on file  . Attends Religious Services: Not on file  . Active Member of Clubs or Organizations: Not on file  . Attends Archivist Meetings: Not on file  . Marital Status: Not on file  Intimate Partner Violence:   . Fear of Current or Ex-Partner: Not on file  . Emotionally Abused: Not on file  . Physically Abused: Not on file  . Sexually Abused: Not on file    Medications:   Current Outpatient Medications on File Prior to Visit  Medication Sig Dispense Refill  . AMOXICILLIN PO Take 500 mg by mouth. For dental work    . Apoaequorin (PREVAGEN) 10 MG CAPS Take  by mouth.    Marland Kitchen aspirin EC 325 MG tablet Take 1 tablet (325 mg total) by mouth daily. 30 tablet 0  . atorvastatin (LIPITOR) 20 MG tablet Take 1 tablet (20 mg total) by mouth daily at 6 PM. 30 tablet 0  . Calcium Carb-Cholecalciferol (CALCIUM 600 + D PO) Take 1 tablet by mouth daily.     . cetirizine (KLS ALLER-TEC) 10 MG tablet Take 10 mg by mouth daily.    . fexofenadine (ALLEGRA) 180 MG tablet Take 180 mg by mouth every evening.     . magnesium oxide (MAG-OX) 400 MG tablet Take 400 mg by mouth daily.    Marland Kitchen  metFORMIN (GLUCOPHAGE-XR) 500 MG 24 hr tablet Take 500 mg by mouth 2 (two) times daily.     . Multiple Vitamin (MULTIVITAMIN ADULT PO) multivitamin    . Multiple Vitamins-Minerals (PRESERVISION AREDS PO) Take by mouth 2 (two) times daily.    Marland Kitchen omeprazole (PRILOSEC) 20 MG capsule omeprazole 20 mg capsule,delayed release  TAKE 1 CAPSULE BY MOUTH TWICE DAILY    . polyethylene glycol (MIRALAX / GLYCOLAX) packet Take 17 g by mouth 2 (two) times daily. (Patient taking differently: Take 17 g by mouth daily as needed for mild constipation. ) 14 each 0  . sildenafil (REVATIO) 20 MG tablet Take 40-100 mg by mouth daily as needed.    . valsartan (DIOVAN) 80 MG tablet Take 80 mg by mouth daily.    . vitamin B-12 (CYANOCOBALAMIN) 500 MCG tablet Take 500 mcg by mouth daily.    . ferrous sulfate (FERROUSUL) 325 (65 FE) MG tablet Take 1 tablet (325 mg total) by mouth 3 (three) times daily with meals.  3   No current facility-administered medications on file prior to visit.    Allergies:   Allergies  Allergen Reactions  . Iodine Hives and Other (See Comments)    Internal only    Physical Exam General: Pleasant elderly Caucasian male, seated, in no evident distress Head: head normocephalic and atraumatic.   Neck: supple with no carotid or supraclavicular bruits Cardiovascular: regular rate and rhythm, no murmurs Musculoskeletal: no deformity Skin:  no rash/petichiae Vascular:  Normal pulses all  extremities  Neurologic Exam Mental Status: Awake and fully alert. Oriented to place and time. Recent and remote memory intact. Attention span, concentration and fund of knowledge appropriate. Mood and affect appropriate.  Recall 3/3.  Able to name 15 animals which can walk on 4 legs.  Clock drawing 4/4. Cranial Nerves: Fundoscopic exam reveals sharp disc margins. Pupils equal, briskly reactive to light. Extraocular movements full without nystagmus. Visual fields full to confrontation. Hearing intact. Facial sensation intact. Face, tongue, palate moves normally and symmetrically.  Motor: Normal bulk and tone. Normal strength in all tested extremity muscles. Sensory.: intact to touch , pinprick , position and vibratory sensation.  Coordination: Rapid alternating movements normal in all extremities. Finger-to-nose and heel-to-shin performed accurately bilaterally. Gait and Station: Arises from chair without difficulty. Stance is normal. Gait demonstrates normal stride length and balance . Able to heel, toe and tandem walk with moderate difficulty.  Reflexes: 1+ and symmetric. Toes downgoing.       ASSESSMENT: 79 year old Caucasian male with 1 year history of short-term memory and cognitive difficulties likely due to mild cognitive impairment with remote history of left thalamic lacunar infarct in  2019 and prior silent lacunar infarcts.  Vascular risk factors of hypertension hyperlipidemia diabetes age and cerebrovascular disease.     PLAN:I had a long discussion with the patient regarding his mild cognitive impairment as well as remote history of stroke and he appears to be stable from both standpoint.  Continue provision for cognitive impairment and participation in cognitively challenging activities like solving crossword puzzles, playing bridge and sodoku.  Continue aspirin for stroke prevention with aggressive risk factor modification with strict control of hypertension with blood pressure goal  below 130/90, diabetes with hemoglobin A1c goal below 6.5% and lipids with LDL cholesterol goal below 70 mg percent.  Check screening follow-up carotid ultrasound study as he has not had it in more than 2 years.  He will return for follow-up in the future in a year or  call earlier if necessary. Greater than 50% time during this 25-minute  visit was spent on counseling and coordination of care about his stroke and memory loss and cognitive impairment and answering questions.    Timothy Contras, MD  [facility] [address] [place], [place] [address]  Phone [phone] Fax [phone]  Note: This document was prepared with digital dictation and possible smart phrase technology. Any transcriptional errors that result from this process are unintentional.

## 2019-12-30 DIAGNOSIS — L309 Dermatitis, unspecified: Secondary | ICD-10-CM | POA: Diagnosis not present

## 2019-12-30 DIAGNOSIS — H61892 Other specified disorders of left external ear: Secondary | ICD-10-CM | POA: Diagnosis not present

## 2020-01-02 DIAGNOSIS — E119 Type 2 diabetes mellitus without complications: Secondary | ICD-10-CM | POA: Diagnosis not present

## 2020-01-02 DIAGNOSIS — E1169 Type 2 diabetes mellitus with other specified complication: Secondary | ICD-10-CM | POA: Diagnosis not present

## 2020-01-02 DIAGNOSIS — J439 Emphysema, unspecified: Secondary | ICD-10-CM | POA: Diagnosis not present

## 2020-01-02 DIAGNOSIS — E782 Mixed hyperlipidemia: Secondary | ICD-10-CM | POA: Diagnosis not present

## 2020-01-02 DIAGNOSIS — I10 Essential (primary) hypertension: Secondary | ICD-10-CM | POA: Diagnosis not present

## 2020-01-02 DIAGNOSIS — M8580 Other specified disorders of bone density and structure, unspecified site: Secondary | ICD-10-CM | POA: Diagnosis not present

## 2020-01-03 ENCOUNTER — Other Ambulatory Visit: Payer: Self-pay

## 2020-01-03 ENCOUNTER — Ambulatory Visit (HOSPITAL_COMMUNITY)
Admission: RE | Admit: 2020-01-03 | Discharge: 2020-01-03 | Disposition: A | Discharge status: Home / Self Care | Payer: PPO | Source: Ambulatory Visit | Attending: Neurology | Admitting: Neurology

## 2020-01-03 DIAGNOSIS — I6529 Occlusion and stenosis of unspecified carotid artery: Secondary | ICD-10-CM | POA: Diagnosis not present

## 2020-01-03 NOTE — Progress Notes (Signed)
Carotid duplex has been completed.   Preliminary results in CV Proc.   Abram Sander 01/03/2020 10:18 AM

## 2020-01-20 NOTE — Progress Notes (Signed)
Kindly inform the patient that carotid ultrasound study showed no significant narrowing of either carotid artery in the neck.

## 2020-01-24 ENCOUNTER — Telehealth: Payer: Self-pay | Admitting: Emergency Medicine

## 2020-01-24 NOTE — Telephone Encounter (Signed)
-----   Message from Garvin Fila, MD sent at 01/20/2020  8:41 AM EST ----- Kindly inform the patient that carotid ultrasound study showed no significant narrowing of either carotid artery in the neck.

## 2020-01-24 NOTE — Telephone Encounter (Signed)
Called and spoke to patient regarding Dr. Clydene Fake findings of carotid ultrasound study.  Patient denied any questions and expressed appreciation.

## 2020-02-09 DIAGNOSIS — Z20828 Contact with and (suspected) exposure to other viral communicable diseases: Secondary | ICD-10-CM | POA: Diagnosis not present

## 2020-02-23 DIAGNOSIS — E782 Mixed hyperlipidemia: Secondary | ICD-10-CM | POA: Diagnosis not present

## 2020-02-23 DIAGNOSIS — I10 Essential (primary) hypertension: Secondary | ICD-10-CM | POA: Diagnosis not present

## 2020-02-23 DIAGNOSIS — M8580 Other specified disorders of bone density and structure, unspecified site: Secondary | ICD-10-CM | POA: Diagnosis not present

## 2020-02-23 DIAGNOSIS — E119 Type 2 diabetes mellitus without complications: Secondary | ICD-10-CM | POA: Diagnosis not present

## 2020-02-23 DIAGNOSIS — J439 Emphysema, unspecified: Secondary | ICD-10-CM | POA: Diagnosis not present

## 2020-02-23 DIAGNOSIS — E1169 Type 2 diabetes mellitus with other specified complication: Secondary | ICD-10-CM | POA: Diagnosis not present

## 2020-03-15 DIAGNOSIS — I10 Essential (primary) hypertension: Secondary | ICD-10-CM | POA: Diagnosis not present

## 2020-03-15 DIAGNOSIS — E1169 Type 2 diabetes mellitus with other specified complication: Secondary | ICD-10-CM | POA: Diagnosis not present

## 2020-03-15 DIAGNOSIS — N529 Male erectile dysfunction, unspecified: Secondary | ICD-10-CM | POA: Diagnosis not present

## 2020-03-15 DIAGNOSIS — R413 Other amnesia: Secondary | ICD-10-CM | POA: Diagnosis not present

## 2020-03-15 DIAGNOSIS — E782 Mixed hyperlipidemia: Secondary | ICD-10-CM | POA: Diagnosis not present

## 2020-03-15 DIAGNOSIS — L299 Pruritus, unspecified: Secondary | ICD-10-CM | POA: Diagnosis not present

## 2020-03-15 DIAGNOSIS — Z7984 Long term (current) use of oral hypoglycemic drugs: Secondary | ICD-10-CM | POA: Diagnosis not present

## 2020-03-15 DIAGNOSIS — J309 Allergic rhinitis, unspecified: Secondary | ICD-10-CM | POA: Diagnosis not present

## 2020-03-15 DIAGNOSIS — I6930 Unspecified sequelae of cerebral infarction: Secondary | ICD-10-CM | POA: Diagnosis not present

## 2020-03-15 DIAGNOSIS — J439 Emphysema, unspecified: Secondary | ICD-10-CM | POA: Diagnosis not present

## 2020-05-04 DIAGNOSIS — I10 Essential (primary) hypertension: Secondary | ICD-10-CM | POA: Diagnosis not present

## 2020-05-04 DIAGNOSIS — E1169 Type 2 diabetes mellitus with other specified complication: Secondary | ICD-10-CM | POA: Diagnosis not present

## 2020-05-04 DIAGNOSIS — E782 Mixed hyperlipidemia: Secondary | ICD-10-CM | POA: Diagnosis not present

## 2020-05-04 DIAGNOSIS — E119 Type 2 diabetes mellitus without complications: Secondary | ICD-10-CM | POA: Diagnosis not present

## 2020-05-04 DIAGNOSIS — M8580 Other specified disorders of bone density and structure, unspecified site: Secondary | ICD-10-CM | POA: Diagnosis not present

## 2020-05-04 DIAGNOSIS — J439 Emphysema, unspecified: Secondary | ICD-10-CM | POA: Diagnosis not present

## 2020-05-21 DIAGNOSIS — E119 Type 2 diabetes mellitus without complications: Secondary | ICD-10-CM | POA: Diagnosis not present

## 2020-05-21 DIAGNOSIS — H353112 Nonexudative age-related macular degeneration, right eye, intermediate dry stage: Secondary | ICD-10-CM | POA: Diagnosis not present

## 2020-05-21 DIAGNOSIS — H353122 Nonexudative age-related macular degeneration, left eye, intermediate dry stage: Secondary | ICD-10-CM | POA: Diagnosis not present

## 2020-05-21 DIAGNOSIS — H10501 Unspecified blepharoconjunctivitis, right eye: Secondary | ICD-10-CM | POA: Diagnosis not present

## 2020-06-03 ENCOUNTER — Observation Stay (HOSPITAL_COMMUNITY): Payer: PPO

## 2020-06-03 ENCOUNTER — Observation Stay (HOSPITAL_COMMUNITY)
Admission: EM | Admit: 2020-06-03 | Discharge: 2020-06-04 | Disposition: A | Discharge status: Home / Self Care | Payer: PPO | Attending: Internal Medicine | Admitting: Internal Medicine

## 2020-06-03 ENCOUNTER — Emergency Department (HOSPITAL_COMMUNITY): Payer: PPO

## 2020-06-03 ENCOUNTER — Encounter (HOSPITAL_COMMUNITY): Payer: Self-pay | Admitting: Emergency Medicine

## 2020-06-03 DIAGNOSIS — M6281 Muscle weakness (generalized): Secondary | ICD-10-CM | POA: Diagnosis not present

## 2020-06-03 DIAGNOSIS — Z96642 Presence of left artificial hip joint: Secondary | ICD-10-CM | POA: Insufficient documentation

## 2020-06-03 DIAGNOSIS — Z7984 Long term (current) use of oral hypoglycemic drugs: Secondary | ICD-10-CM | POA: Insufficient documentation

## 2020-06-03 DIAGNOSIS — R479 Unspecified speech disturbances: Secondary | ICD-10-CM | POA: Diagnosis present

## 2020-06-03 DIAGNOSIS — R4701 Aphasia: Secondary | ICD-10-CM

## 2020-06-03 DIAGNOSIS — Z87891 Personal history of nicotine dependence: Secondary | ICD-10-CM | POA: Insufficient documentation

## 2020-06-03 DIAGNOSIS — R29898 Other symptoms and signs involving the musculoskeletal system: Secondary | ICD-10-CM | POA: Diagnosis not present

## 2020-06-03 DIAGNOSIS — G319 Degenerative disease of nervous system, unspecified: Secondary | ICD-10-CM | POA: Diagnosis not present

## 2020-06-03 DIAGNOSIS — Z20822 Contact with and (suspected) exposure to covid-19: Secondary | ICD-10-CM | POA: Insufficient documentation

## 2020-06-03 DIAGNOSIS — G459 Transient cerebral ischemic attack, unspecified: Secondary | ICD-10-CM | POA: Diagnosis not present

## 2020-06-03 DIAGNOSIS — R410 Disorientation, unspecified: Secondary | ICD-10-CM | POA: Diagnosis not present

## 2020-06-03 DIAGNOSIS — E119 Type 2 diabetes mellitus without complications: Secondary | ICD-10-CM | POA: Insufficient documentation

## 2020-06-03 DIAGNOSIS — Z79899 Other long term (current) drug therapy: Secondary | ICD-10-CM | POA: Insufficient documentation

## 2020-06-03 DIAGNOSIS — Z7982 Long term (current) use of aspirin: Secondary | ICD-10-CM | POA: Diagnosis not present

## 2020-06-03 DIAGNOSIS — I10 Essential (primary) hypertension: Secondary | ICD-10-CM | POA: Insufficient documentation

## 2020-06-03 DIAGNOSIS — I6782 Cerebral ischemia: Secondary | ICD-10-CM | POA: Diagnosis not present

## 2020-06-03 DIAGNOSIS — R29818 Other symptoms and signs involving the nervous system: Secondary | ICD-10-CM | POA: Diagnosis not present

## 2020-06-03 LAB — DIFFERENTIAL
Abs Immature Granulocytes: 0.01 10*3/uL (ref 0.00–0.07)
Basophils Absolute: 0.1 10*3/uL (ref 0.0–0.1)
Basophils Relative: 1 %
Eosinophils Absolute: 0.3 10*3/uL (ref 0.0–0.5)
Eosinophils Relative: 5 %
Immature Granulocytes: 0 %
Lymphocytes Relative: 22 %
Lymphs Abs: 1.3 10*3/uL (ref 0.7–4.0)
Monocytes Absolute: 0.8 10*3/uL (ref 0.1–1.0)
Monocytes Relative: 13 %
Neutro Abs: 3.6 10*3/uL (ref 1.7–7.7)
Neutrophils Relative %: 59 %

## 2020-06-03 LAB — GLUCOSE, CAPILLARY: Glucose-Capillary: 115 mg/dL — ABNORMAL HIGH (ref 70–99)

## 2020-06-03 LAB — COMPREHENSIVE METABOLIC PANEL
ALT: 20 U/L (ref 0–44)
AST: 22 U/L (ref 15–41)
Albumin: 3.9 g/dL (ref 3.5–5.0)
Alkaline Phosphatase: 50 U/L (ref 38–126)
Anion gap: 7 (ref 5–15)
BUN: 20 mg/dL (ref 8–23)
CO2: 25 mmol/L (ref 22–32)
Calcium: 9.4 mg/dL (ref 8.9–10.3)
Chloride: 104 mmol/L (ref 98–111)
Creatinine, Ser: 1.06 mg/dL (ref 0.61–1.24)
GFR, Estimated: >60 mL/min (ref >60)
Glucose, Bld: 116 mg/dL — ABNORMAL HIGH (ref 70–99)
Potassium: 4.3 mmol/L (ref 3.5–5.1)
Sodium: 136 mmol/L (ref 135–145)
Total Bilirubin: 0.7 mg/dL (ref 0.3–1.2)
Total Protein: 6.9 g/dL (ref 6.5–8.1)

## 2020-06-03 LAB — CBC
HCT: 43.7 % (ref 39.0–52.0)
Hemoglobin: 14.8 g/dL (ref 13.0–17.0)
MCH: 32.2 pg (ref 26.0–34.0)
MCHC: 33.9 g/dL (ref 30.0–36.0)
MCV: 95 fL (ref 80.0–100.0)
Platelets: 253 10*3/uL (ref 150–400)
RBC: 4.6 MIL/uL (ref 4.22–5.81)
RDW: 13.9 % (ref 11.5–15.5)
WBC: 6.1 10*3/uL (ref 4.0–10.5)
nRBC: 0 % (ref 0.0–0.2)

## 2020-06-03 LAB — PROTIME-INR
INR: 0.9 (ref 0.8–1.2)
Prothrombin Time: 12 seconds (ref 11.4–15.2)

## 2020-06-03 LAB — RESP PANEL BY RT-PCR (FLU A&B, COVID) ARPGX2
Influenza A by PCR: NEGATIVE
Influenza B by PCR: NEGATIVE
SARS Coronavirus 2 by RT PCR: NEGATIVE

## 2020-06-03 LAB — APTT: aPTT: 28 seconds (ref 24–36)

## 2020-06-03 LAB — ECHOCARDIOGRAM COMPLETE

## 2020-06-03 MED ORDER — ATORVASTATIN CALCIUM 10 MG PO TABS
20.0000 mg | ORAL_TABLET | Freq: Every day | ORAL | Status: DC
  Administered 2020-06-03: 20 mg via ORAL
  Filled 2020-06-03: qty 2

## 2020-06-03 MED ORDER — FERROUS SULFATE 325 (65 FE) MG PO TABS
325.0000 mg | ORAL_TABLET | Freq: Three times a day (TID) | ORAL | Status: DC
  Administered 2020-06-04 (×2): 325 mg via ORAL
  Filled 2020-06-03 (×3): qty 1

## 2020-06-03 MED ORDER — STROKE: EARLY STAGES OF RECOVERY BOOK
Freq: Once | Status: AC
  Filled 2020-06-03: qty 1

## 2020-06-03 MED ORDER — LORATADINE 10 MG PO TABS
10.0000 mg | ORAL_TABLET | Freq: Every day | ORAL | Status: DC
  Administered 2020-06-04: 10 mg via ORAL
  Filled 2020-06-03: qty 1

## 2020-06-03 MED ORDER — SODIUM CHLORIDE 0.9% FLUSH
3.0000 mL | Freq: Once | INTRAVENOUS | Status: AC
  Administered 2020-06-03: 3 mL via INTRAVENOUS

## 2020-06-03 MED ORDER — ASPIRIN EC 325 MG PO TBEC
325.0000 mg | DELAYED_RELEASE_TABLET | Freq: Every day | ORAL | Status: DC
  Administered 2020-06-03 – 2020-06-04 (×2): 325 mg via ORAL
  Filled 2020-06-03 (×2): qty 1

## 2020-06-03 MED ORDER — ACETAMINOPHEN 325 MG PO TABS: 650.0000 mg | ORAL_TABLET | ORAL | Status: DC | PRN

## 2020-06-03 MED ORDER — ACETAMINOPHEN 650 MG RE SUPP
650.0000 mg | RECTAL | Status: DC | PRN
Start: 2020-06-03 — End: 2020-06-04

## 2020-06-03 MED ORDER — ACETAMINOPHEN 160 MG/5ML PO SOLN: 650.0000 mg | ORAL | Status: DC | PRN

## 2020-06-03 MED ORDER — MAGNESIUM OXIDE 400 (241.3 MG) MG PO TABS
400.0000 mg | ORAL_TABLET | Freq: Every day | ORAL | Status: DC
  Administered 2020-06-04: 400 mg via ORAL
  Filled 2020-06-03 (×3): qty 1

## 2020-06-03 MED ORDER — ENOXAPARIN SODIUM 40 MG/0.4ML ~~LOC~~ SOLN
40.0000 mg | SUBCUTANEOUS | Status: DC
  Administered 2020-06-03: 40 mg via SUBCUTANEOUS
  Filled 2020-06-03: qty 0.4

## 2020-06-03 MED ORDER — INSULIN ASPART 100 UNIT/ML ~~LOC~~ SOLN: 0.0000 [IU] | Freq: Three times a day (TID) | SUBCUTANEOUS | Status: DC

## 2020-06-03 MED ORDER — POLYETHYLENE GLYCOL 3350 17 G PO PACK
17.0000 g | PACK | Freq: Two times a day (BID) | ORAL | Status: DC
  Administered 2020-06-04: 17 g via ORAL
  Filled 2020-06-03 (×2): qty 1

## 2020-06-03 MED ORDER — HYDRALAZINE HCL 25 MG PO TABS: 25.0000 mg | ORAL_TABLET | Freq: Four times a day (QID) | ORAL | Status: DC | PRN

## 2020-06-03 MED ORDER — PANTOPRAZOLE SODIUM 40 MG PO TBEC
40.0000 mg | DELAYED_RELEASE_TABLET | Freq: Every day | ORAL | Status: DC
  Administered 2020-06-04: 40 mg via ORAL
  Filled 2020-06-03: qty 1

## 2020-06-03 MED ORDER — SENNOSIDES-DOCUSATE SODIUM 8.6-50 MG PO TABS: 1.0000 | ORAL_TABLET | Freq: Every evening | ORAL | Status: DC | PRN

## 2020-06-03 NOTE — ED Notes (Addendum)
Pt to echo.

## 2020-06-03 NOTE — ED Provider Notes (Signed)
[facility] EMERGENCY DEPARTMENT Provider Note   CSN: [id number] Arrival date & time: 06/03/20  1204     History Chief Complaint  Patient presents with  . Aphasia    Timothy Brewer is a 80 y.o. male.  Patient is a 80 year old male with a history of diabetes, hypertension and prior TIA in 2019 who presents with difficulty with his speech.  He said that he was at church this morning around 9:00 AM and noticed that while he was singing his hymns he suddenly could not figure out what came next.  He setting is very familiar with the hymns but could not get his words out for the next verse.  He said initially when he started thinking he was fine.  He said he did not really notice when it stopped because he was not really talking to anyone after that but felt like he was feeling better on the way to the hospital.  He did also note that this morning his right leg felt weaker.  He woke up and noticed that he was having a harder time standing on his right leg and at church she was having a hard time getting up and down with his right leg.  He feels like currently he is back to baseline.  His last known normal for his leg weakness was last night at 11 PM when he went to bed.  He currently denies any symptoms.  He had a prior TIA in 2019 but has not had any other strokes.  He was on aspirin and Plavix but had a reaction to the Plavix and was taken off of it.  He is currently on 325 mg of aspirin.        Past Medical History:  Diagnosis Date  . Arthritis   . Diabetes mellitus without complication ([place])   . Hypertension   . Memory loss   . Pre-diabetes    on metformin  . Stroke [facility])     Patient Active Problem List   Diagnosis Date Noted  . CVA (cerebral vascular accident) ([place]) 06/07/2017  . Overweight (BMI 25.0-29.9) 02/18/2017  . 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 Plantar fasciitis 06/09/2016    Past Surgical History:  Procedure Laterality Date  . BACK SURGERY      c3,c4,c5,c6 ,l4,l5  . EYE SURGERY     bil cataracts  . NASAL SEPTUM SURGERY    . TONSILLECTOMY    . 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   Procedure: LEFT TOTAL HIP ARTHROPLASTY ANTERIOR APPROACH;  Surgeon: Paralee Cancel, MD;  Location: [facility] ORS;  Service: Orthopedics;  Laterality: Left;  70 mins       Family History  Problem Relation Age of Onset  . Stroke Maternal Grandmother     Social History   Tobacco Use  . Smoking status: Former Smoker    Types: Cigarettes    Quit date: 07/09/2014    Years since quitting: 5.9  . Smokeless tobacco: Never Used  . Tobacco comment: 8-10 a day  Vaping Use  . Vaping Use: Never used  Substance Use Topics  . Alcohol use: Yes    Alcohol/week: 1.0 standard drink    Types: 1 Glasses of wine per week    Comment: socially  . Drug use: No    Home Medications Prior to Admission medications   Medication Sig Start Date End Date Taking? Authorizing Provider  AMOXICILLIN PO Take 500 mg by mouth. For dental work    Secondary school teacher,  Historical, MD  Apoaequorin (PREVAGEN) 10 MG CAPS Take by mouth.    [provider]  aspirin EC 325 MG tablet Take 1 tablet (325 mg total) by mouth daily. 07/08/17   Frann Rider, NP  atorvastatin (LIPITOR) 20 MG tablet Take 1 tablet (20 mg total) by mouth daily at 6 PM. 06/08/17   Regalado, Belkys A, MD  Calcium Carb-Cholecalciferol (CALCIUM 600 + D PO) Take 1 tablet by mouth daily.     [provider]  cetirizine (KLS ALLER-TEC) 10 MG tablet Take 10 mg by mouth daily.    [provider]  ferrous sulfate (FERROUSUL) 325 (65 FE) MG tablet Take 1 tablet (325 mg total) by mouth 3 (three) times daily with meals. 02/17/17   Danae Orleans, PA-C  fexofenadine (ALLEGRA) 180 MG tablet Take 180 mg by mouth every evening.     [provider]  magnesium oxide (MAG-OX) 400 MG tablet Take 400 mg by mouth daily.    [provider]  metFORMIN (GLUCOPHAGE-XR) 500 MG 24 hr tablet Take 500 mg by mouth 2  (two) times daily.  04/18/16   [provider]  Multiple Vitamin (MULTIVITAMIN ADULT PO) multivitamin    [provider]  Multiple Vitamins-Minerals (PRESERVISION AREDS PO) Take by mouth 2 (two) times daily.    [provider]  omeprazole (PRILOSEC) 20 MG capsule omeprazole 20 mg capsule,delayed release  TAKE 1 CAPSULE BY MOUTH TWICE DAILY    [provider]  polyethylene glycol (MIRALAX / GLYCOLAX) packet Take 17 g by mouth 2 (two) times daily. Patient taking differently: Take 17 g by mouth daily as needed for mild constipation.  02/17/17   Danae Orleans, PA-C  sildenafil (REVATIO) 20 MG tablet Take 40-100 mg by mouth daily as needed. 01/19/19   [provider]  valsartan (DIOVAN) 80 MG tablet Take 80 mg by mouth daily.    [provider]  vitamin B-12 (CYANOCOBALAMIN) 500 MCG tablet Take 500 mcg by mouth daily.    [provider]    Allergies    Iodine  Review of Systems   Review of Systems  Constitutional: Negative for chills, diaphoresis, fatigue and fever.  HENT: Negative for congestion, rhinorrhea and sneezing.   Eyes: Negative.   Respiratory: Negative for cough, chest tightness and shortness of breath.   Cardiovascular: Negative for chest pain and leg swelling.  Gastrointestinal: Negative for abdominal pain, blood in stool, diarrhea, nausea and vomiting.  Genitourinary: Negative for difficulty urinating, flank pain, frequency and hematuria.  Musculoskeletal: Negative for arthralgias and back pain.  Skin: Negative for rash.  Neurological: Positive for speech difficulty and weakness. Negative for dizziness, numbness and headaches.    Physical Exam Updated Vital Signs BP (!) 150/76   Pulse 63   Temp 98.3 F (36.8 C) (Oral)   Resp 14   SpO2 97%   Physical Exam Constitutional:      Appearance: He is well-developed.  HENT:     Head: Normocephalic and atraumatic.  Eyes:     Pupils: Pupils are equal, round, and  reactive to light.  Cardiovascular:     Rate and Rhythm: Normal rate and regular rhythm.     Heart sounds: Normal heart sounds.  Pulmonary:     Effort: Pulmonary effort is normal. No respiratory distress.     Breath sounds: Normal breath sounds. No wheezing or rales.  Chest:     Chest wall: No tenderness.  Abdominal:     General: Bowel sounds are normal.  Palpations: Abdomen is soft.     Tenderness: There is no abdominal tenderness. There is no guarding or rebound.  Musculoskeletal:        General: Normal range of motion.     Cervical back: Normal range of motion and neck supple.  Lymphadenopathy:     Cervical: No cervical adenopathy.  Skin:    General: Skin is warm and dry.     Findings: No rash.  Neurological:     Mental Status: He is alert and oriented to person, place, and time.     Comments: Cranial nerves II through XII grossly intact, visual fields full to confrontation motor 5 out of 5 all extremities.  He seemed a little bit weaker in the left lower extremity as compared to the right although he said he has had prior hip surgery so it may be related to that.  The weakness that he was appreciating earlier today was on his right leg.  Sensation grossly intact to light touch all extremities, no pronator drift     ED Results / Procedures / Treatments   Labs (all labs ordered are listed, but only abnormal results are displayed) Labs Reviewed  COMPREHENSIVE METABOLIC PANEL - Abnormal; Notable for the following components:      Result Value   Glucose, Bld 116 (*)    All other components within normal limits  RESP PANEL BY RT-PCR (FLU A&B, COVID) ARPGX2  PROTIME-INR  APTT  CBC  DIFFERENTIAL  CBG MONITORING, ED    EKG EKG Interpretation  Date/Time:  Sunday June 03 2020 12:22:11 EDT Ventricular Rate:  65 PR Interval:  156 QRS Duration: 100 QT Interval:  394 QTC Calculation: 409 R Axis:   -57 Text Interpretation: Normal sinus rhythm Left axis deviation  Nonspecific ST abnormality Abnormal ECG since last tracing no significant change Confirmed by Malvin Johns [phone]) on 06/03/2020 12:41:51 PM   Radiology CT HEAD WO CONTRAST  Result Date: 06/03/2020 CLINICAL DATA:  Aphasia.  Neurologic deficit. EXAM: CT HEAD WITHOUT CONTRAST TECHNIQUE: Contiguous axial images were obtained from the base of the skull through the vertex without intravenous contrast. COMPARISON:  MRI brain 03/31/2019 FINDINGS: Brain: No evidence of acute infarction, hemorrhage, hydrocephalus, extra-axial collection or mass lesion/mass effect. There is mild diffuse low-attenuation within the subcortical and periventricular white matter compatible with chronic microvascular disease. Mild prominence of sulci and ventricles compatible with age related brain atrophy. Vascular: No hyperdense vessel or unexpected calcification. Skull: Normal. Negative for fracture or focal lesion. Sinuses/Orbits: Mild mucosal thickening of the maxillary sinuses. Mastoid air cells appear clear. Other: None. IMPRESSION: 1. No acute intracranial abnormalities. 2. Chronic small vessel ischemic change and brain atrophy. Electronically Signed   By: Kerby Moors M.D.   On: 06/03/2020 13:56    Procedures Procedures   Medications Ordered in ED Medications  sodium chloride flush (NS) 0.9 % injection 3 mL (has no administration in time range)    ED Course  I have reviewed the triage vital signs and the nursing notes.  Pertinent labs & imaging results that were available during my care of the patient were reviewed by me and considered in my medical decision making (see chart for details).    MDM Rules/Calculators/A&P                          Patient is a 80 year old male who presents with aphasia happened while he was at church this morning.  This has resolved.  He  also noticed some right leg weakness which is also resolved.  His head CT shows no acute abnormality.  His labs are nonconcerning.  MRI has been  ordered.  I spoke with Dr. Yvetta Coder with neurology who will see the patient.  Plan is admission for further stroke work-up.  I spoke with Dr. Roosevelt Locks who will admit the pt. Final Clinical Impression(s) / ED Diagnoses Final diagnoses:  Aphasia  Right leg weakness    Rx / DC Orders ED Discharge Orders    None       Malvin Johns, MD 06/03/20 1531

## 2020-06-03 NOTE — H&P (Signed)
History and Physical    Timothy Brewer:[id number] DOB: 1940-12-07 DOA: 06/03/2020  PCP: Antony Contras, MD (Confirm with patient/family/NH records and if not entered, this has to be entered at [facility] point of entry) Patient coming from: Home  I have personally briefly reviewed patient's old medical records in [place]  Chief Complaint: Confusion  HPI: Timothy Brewer is a 80 y.o. male with medical history significant of IIDM, HTN, HLD, TIA, presented with sudden onset of confusion and speech problem.  Patient was at surgery service this morning, singing hymns, and suddenly became confused and could not understand the meaning/pronounciation on the hymn book, denied any vision problem or weakness or numbness. Whole episode lasted about 25-30 min and fully resolved before EMS arrived. He also reported he had one time right leg weakness when waking up this morning, but only lasted few minutes.  ED Course: CT head no significant finding and neuro exam non-focal.  Review of Systems: As per HPI otherwise 10 point review of systems negative.    Past Medical History:  Diagnosis Date  . Arthritis   . Diabetes mellitus without complication ([place])   . Hypertension   . Memory loss   . Pre-diabetes    on metformin  . Stroke [facility])     Past Surgical History:  Procedure Laterality Date  . BACK SURGERY     c3,c4,c5,c6 ,l4,l5  . EYE SURGERY     bil cataracts  . NASAL SEPTUM SURGERY    . TONSILLECTOMY    . 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 His smoking use included cigarettes. He has never used smokeless tobacco. He reports current alcohol use of about 1.0 standard drink of alcohol per week. He reports that he does not use drugs.  Allergies  Allergen Reactions  . Iodine  Hives and Other (See Comments)    Internal only    Family History  Problem Relation Age of Onset  . Stroke Maternal Grandmother      Prior to Admission medications   Medication Sig Start Date End Date Taking? Authorizing Provider  AMOXICILLIN PO Take 500 mg by mouth. For dental work    [provider]  Apoaequorin (PREVAGEN) 10 MG CAPS Take by mouth.    [provider]  aspirin EC 325 MG tablet Take 1 tablet (325 mg total) by mouth daily. 07/08/17   Frann Rider, NP  atorvastatin (LIPITOR) 20 MG tablet Take 1 tablet (20 mg total) by mouth daily at 6 PM. 06/08/17   Regalado, Belkys A, MD  Calcium Carb-Cholecalciferol (CALCIUM 600 + D PO) Take 1 tablet by mouth daily.     [provider]  cetirizine (KLS ALLER-TEC) 10 MG tablet Take 10 mg by mouth daily.    [provider]  ferrous sulfate (FERROUSUL) 325 (65 FE) MG tablet Take 1 tablet (325 mg total) by mouth 3 (three) times daily with meals. 02/17/17   Danae Orleans, PA-C  fexofenadine (ALLEGRA) 180 MG tablet Take 180 mg by mouth every evening.     [provider]  magnesium oxide (MAG-OX) 400 MG tablet Take 400 mg by mouth daily.    [provider]  metFORMIN (GLUCOPHAGE-XR) 500 MG 24 hr tablet Take 500 mg by mouth 2 (two) times daily.  04/18/16   [provider]  Multiple Vitamin (MULTIVITAMIN ADULT PO) multivitamin    [provider]  Multiple Vitamins-Minerals (PRESERVISION AREDS PO) Take by mouth 2 (two) times daily.    [provider]  omeprazole (PRILOSEC) 20 MG capsule omeprazole 20 mg capsule,delayed release  TAKE 1 CAPSULE BY MOUTH TWICE DAILY    [provider]  polyethylene glycol (MIRALAX / GLYCOLAX) packet Take 17 g by mouth 2 (two) times daily. Patient taking differently: Take 17 g by mouth daily as needed for mild constipation.  02/17/17   Danae Orleans, PA-C  sildenafil (REVATIO) 20 MG tablet Take 40-100 mg by mouth daily as needed.  01/19/19   [provider]  valsartan (DIOVAN) 80 MG tablet Take 80 mg by mouth daily.    [provider]  vitamin B-12 (CYANOCOBALAMIN) 500 MCG tablet Take 500 mcg by mouth daily.    [provider]    Physical Exam: Vitals:   06/03/20 1219 06/03/20 1315 06/03/20 1345 06/03/20 1415  BP: (!) 158/77 136/75 (!) 145/74 (!) 150/76  Pulse: 65 65 62 63  Resp: 16 19 (!) 22 14  Temp: 98.3 F (36.8 C)     TempSrc: Oral     SpO2: 100% 100% 98% 97%    Constitutional: NAD, calm, comfortable Vitals:   06/03/20 1219 06/03/20 1315 06/03/20 1345 06/03/20 1415  BP: (!) 158/77 136/75 (!) 145/74 (!) 150/76  Pulse: 65 65 62 63  Resp: 16 19 (!) 22 14  Temp: 98.3 F (36.8 C)     TempSrc: Oral     SpO2: 100% 100% 98% 97%   Eyes: PERRL, lids and conjunctivae normal ENMT: Mucous membranes are moist. Posterior pharynx clear of any exudate or lesions.Normal dentition.  Neck: normal, supple, no masses, no thyromegaly Respiratory: clear to auscultation bilaterally, no wheezing, no crackles. Normal respiratory effort. No accessory muscle use.  Cardiovascular: Regular rate and rhythm, soft systolic murmur on heart base. No extremity edema. 2+ pedal pulses. No carotid bruits.  Abdomen: no tenderness, no masses palpated. No hepatosplenomegaly. Bowel sounds positive.  Musculoskeletal: no clubbing / cyanosis. No joint deformity upper and lower extremities. Good ROM, no contractures. Normal muscle tone.  Skin: no rashes, lesions, ulcers. No induration Neurologic: CN 2-12 grossly intact. Sensation intact, DTR normal. Strength 5/5 in all 4.  Psychiatric: Normal judgment and insight. Alert and oriented x 3. Normal mood.     Labs on Admission: I have personally reviewed following labs and imaging studies  CBC: Recent Labs  Lab 06/03/20 1234  WBC 6.1  NEUTROABS 3.6  HGB 14.8  HCT 43.7  MCV 95.0  PLT 885   Basic Metabolic Panel: Recent Labs  Lab 06/03/20 1234  NA 136  K  4.3  CL 104  CO2 25  GLUCOSE 116*  BUN 20  CREATININE 1.06  CALCIUM 9.4   GFR: CrCl cannot be calculated (Unknown ideal weight.). Liver Function Tests: Recent Labs  Lab 06/03/20 1234  AST 22  ALT 20  ALKPHOS 50  BILITOT 0.7  PROT 6.9  ALBUMIN 3.9   No results for input(s): LIPASE, AMYLASE in the last 168 hours. No results for input(s): AMMONIA in the last 168 hours. Coagulation Profile: Recent Labs  Lab 06/03/20 1234  INR 0.9   Cardiac Enzymes: No results for input(s): CKTOTAL, CKMB, CKMBINDEX, TROPONINI in the last 168 hours. BNP (last 3 results) No results for input(s): PROBNP in the last 8760 hours. HbA1C: No results for input(s): HGBA1C in the  last 72 hours. CBG: No results for input(s): GLUCAP in the last 168 hours. Lipid Profile: No results for input(s): CHOL, HDL, LDLCALC, TRIG, CHOLHDL, LDLDIRECT in the last 72 hours. Thyroid Function Tests: No results for input(s): TSH, T4TOTAL, FREET4, T3FREE, THYROIDAB in the last 72 hours. Anemia Panel: No results for input(s): VITAMINB12, FOLATE, FERRITIN, TIBC, IRON, RETICCTPCT in the last 72 hours. Urine analysis:    Component Value Date/Time   COLORURINE YELLOW 06/07/2017 [place] 06/07/2017 1137   LABSPEC 1.010 06/07/2017 1137   PHURINE 7.0 06/07/2017 1137   GLUCOSEU NEGATIVE 06/07/2017 1137   HGBUR NEGATIVE 06/07/2017 1137   BILIRUBINUR NEGATIVE 06/07/2017 1137   KETONESUR NEGATIVE 06/07/2017 1137   PROTEINUR NEGATIVE 06/07/2017 1137   NITRITE NEGATIVE 06/07/2017 1137   LEUKOCYTESUR NEGATIVE 06/07/2017 1137    Radiological Exams on Admission: CT HEAD WO CONTRAST  Result Date: 06/03/2020 CLINICAL DATA:  Aphasia.  Neurologic deficit. EXAM: CT HEAD WITHOUT CONTRAST TECHNIQUE: Contiguous axial images were obtained from the base of the skull through the vertex without intravenous contrast. COMPARISON:  MRI brain 03/31/2019 FINDINGS: Brain: No evidence of acute infarction, hemorrhage,  hydrocephalus, extra-axial collection or mass lesion/mass effect. There is mild diffuse low-attenuation within the subcortical and periventricular white matter compatible with chronic microvascular disease. Mild prominence of sulci and ventricles compatible with age related brain atrophy. Vascular: No hyperdense vessel or unexpected calcification. Skull: Normal. Negative for fracture or focal lesion. Sinuses/Orbits: Mild mucosal thickening of the maxillary sinuses. Mastoid air cells appear clear. Other: None. IMPRESSION: 1. No acute intracranial abnormalities. 2. Chronic small vessel ischemic change and brain atrophy. Electronically Signed   By: Kerby Moors M.D.   On: 06/03/2020 13:56    EKG: Independently reviewed. Similar QRS morphology as compared to 2019.  Assessment/Plan Active Problems:   TIA (transient ischemic attack)  (please populate well all problems here in Problem List. (For example, if patient is on BP meds at home and you resume or decide to hold them, it is a problem that needs to be her. Same for CAD, COPD, HLD and so on)  TIA -Transient right lower extremity weakness and receptive aphasia -MRI/MRA -Echocardiogram -Continue aspirin, allow permissive hypertension  Heart murmur -Suspect mild AS, Echo to follow. Denied any chest pain, syncope/near syncope or SOB.  HTN -As above  IIDM -Sliding scale for now.   DVT prophylaxis: Lovenox Code Status: Full code Family Communication: None at bedside Disposition Plan: Expect less than 2 midnight hospital stay Consults called: Neuro Admission status: Tele obs   Lequita Halt MD [facility] Pager [phone]  06/03/2020, 3:32 PM

## 2020-06-03 NOTE — Consult Note (Signed)
Neurology Consultation  Reason for Consult: Transient encephalopathy, bilateral hand numbness, and right leg weakness, concern for TIA  Referring Physician: Dr. Roosevelt Locks  CC: Transient encephalopathy, bilateral hand numbness, and right leg weakness  History is obtained from: Chart review, Patient  HPI: Timothy Brewer is a 80 y.o. male with a medical history significant for type 2 diabetes mellitus, hypertension, memory loss, hyperlipidemia, and a remote history of TIA in 2019 on 325 mg of daily aspirin who presented to the ED for evaluation after experiencing sudden onset of encephalopathy this morning while in church. He states that he was singing a hymn and suddenly felt that he could not make sense of what he was hearing, what he was reading, and he was unable to find his place within the hymn. He states that his mind couldn't keep up with his ears and he felt that there was a "disconnect" mentally. When he got to his vehicle to drive home he noted that he had bilateral hand numbness. When he got home the numbness had resolved and lasted approximately 5 minutes. The episode of confusion lasted approximately 25 - 30 minutes. He also states that he felt weak in his right leg and felt that he was having trouble supporting himself on it while standing. Patient states that the weakness feels neurological in nature and not muscular. He states this weakness was present when he woke up but was normal when he went to bed last night. After the confusion and numbness resolved, he drove himself to the hospital for further evaluation noting a similar feeling to his last transient ischemic attack.   Of note, his previous TIA in 2019 presented while he was keeping score for a domino match with friends. He states that he was writing the score and could not make sense of what he just wrote. The words on the paper did not appear to make any sense. He also notes at that time that he was noticed by a friend dragging his  right foot while walking two days after the confusion with writing was noted and that he had not previously noted dragging his right foot while walking. He was placed on aspirin and clopidogrel for his previous stroke in 2019 but had a bad reaction to clopidogrel and was taken off of it. He states that he sees Dr. Leonie Man at [facility] every 6 months for follow-up evaluation.   LKW: right leg weakness 23:00 last night tpa given?: No, transient symptoms with return to baseline  ROS: A 14 point ROS was performed and is negative except as noted in the HPI.   Past Medical History:  Diagnosis Date  . Arthritis   . Diabetes mellitus without complication ([place])   . Hypertension   . Memory loss   . Pre-diabetes    on metformin  . Stroke [facility])    Past Surgical History:  Procedure Laterality Date  . BACK SURGERY     c3,c4,c5,c6 ,l4,l5  . EYE SURGERY     bil cataracts  . NASAL SEPTUM SURGERY    . TONSILLECTOMY    . 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 Stroke Maternal Grandmother    Social History:   reports that he quit smoking about 5 years ago. His smoking use included cigarettes. He has  never used smokeless tobacco. He reports current alcohol use of about 1.0 standard drink of alcohol per week. He reports that he does not use drugs.  Medications  Current Facility-Administered Medications:  .   stroke: mapping our early stages of recovery book, , Does not apply, Once, Wynetta Fines T, MD .  acetaminophen (TYLENOL) tablet 650 mg, 650 mg, Oral, Q4H PRN **OR** acetaminophen (TYLENOL) 160 MG/5ML solution 650 mg, 650 mg, Per Tube, Q4H PRN **OR** acetaminophen (TYLENOL) suppository 650 mg, 650 mg, Rectal, Q4H PRN, Wynetta Fines T, MD .  aspirin EC tablet 325 mg, 325 mg, Oral, Daily, Roosevelt Locks, Ping T,  MD .  atorvastatin (LIPITOR) tablet 20 mg, 20 mg, Oral, q1800, Wynetta Fines T, MD .  enoxaparin (LOVENOX) injection 40 mg, 40 mg, Subcutaneous, Q24H, Zhang, Ping T, MD .  ferrous sulfate tablet 325 mg, 325 mg, Oral, TID WC, Wynetta Fines T, MD .  hydrALAZINE (APRESOLINE) tablet 25 mg, 25 mg, Oral, Q6H PRN, Wynetta Fines T, MD .  insulin aspart (novoLOG) injection 0-15 Units, 0-15 Units, Subcutaneous, TID WC, Zhang, Ping T, MD .  loratadine (CLARITIN) tablet 10 mg, 10 mg, Oral, Daily, Zhang, Ping T, MD .  magnesium oxide (MAG-OX) tablet 400 mg, 400 mg, Oral, Daily, Zhang, Ping T, MD .  pantoprazole (PROTONIX) EC tablet 40 mg, 40 mg, Oral, Daily, Zhang, Ping T, MD .  polyethylene glycol (MIRALAX / GLYCOLAX) packet 17 g, 17 g, Oral, BID, Zhang, Ping T, MD .  senna-docusate (Senokot-S) tablet 1 tablet, 1 tablet, Oral, QHS PRN, Wynetta Fines T, MD .  sodium chloride flush (NS) 0.9 % injection 3 mL, 3 mL, Intravenous, Once, Malvin Johns, MD  Current Outpatient Medications:  .  AMOXICILLIN PO, Take 500 mg by mouth. For dental work, Disp: , Rfl:  .  Apoaequorin (PREVAGEN) 10 MG CAPS, Take by mouth., Disp: , Rfl:  .  aspirin EC 325 MG tablet, Take 1 tablet (325 mg total) by mouth daily., Disp: 30 tablet, Rfl: 0 .  atorvastatin (LIPITOR) 20 MG tablet, Take 1 tablet (20 mg total) by mouth daily at 6 PM., Disp: 30 tablet, Rfl: 0 .  Calcium Carb-Cholecalciferol (CALCIUM 600 + D PO), Take 1 tablet by mouth daily. , Disp: , Rfl:  .  cetirizine (KLS ALLER-TEC) 10 MG tablet, Take 10 mg by mouth daily., Disp: , Rfl:  .  ferrous sulfate (FERROUSUL) 325 (65 FE) MG tablet, Take 1 tablet (325 mg total) by mouth 3 (three) times daily with meals., Disp: , Rfl: 3 .  fexofenadine (ALLEGRA) 180 MG tablet, Take 180 mg by mouth every evening. , Disp: , Rfl:  .  magnesium oxide (MAG-OX) 400 MG tablet, Take 400 mg by mouth daily., Disp: , Rfl:  .  metFORMIN (GLUCOPHAGE-XR) 500 MG 24 hr tablet, Take 500 mg by mouth 2 (two) times  daily. , Disp: , Rfl:  .  Multiple Vitamin (MULTIVITAMIN ADULT PO), multivitamin, Disp: , Rfl:  .  Multiple Vitamins-Minerals (PRESERVISION AREDS PO), Take by mouth 2 (two) times daily., Disp: , Rfl:  .  omeprazole (PRILOSEC) 20 MG capsule, omeprazole 20 mg capsule,delayed release  TAKE 1 CAPSULE BY MOUTH TWICE DAILY, Disp: , Rfl:  .  polyethylene glycol (MIRALAX / GLYCOLAX) packet, Take 17 g by mouth 2 (two) times daily. (Patient taking differently: Take 17 g by mouth daily as needed for mild constipation. ), Disp: 14 each, Rfl: 0 .  sildenafil (REVATIO) 20 MG tablet, Take 40-100 mg by mouth daily as  needed., Disp: , Rfl:  .  valsartan (DIOVAN) 80 MG tablet, Take 80 mg by mouth daily., Disp: , Rfl:  .  vitamin B-12 (CYANOCOBALAMIN) 500 MCG tablet, Take 500 mcg by mouth daily., Disp: , Rfl:   Exam: Current vital signs: BP 125/61   Pulse 71   Temp 98.4 F (36.9 C) (Oral)   Resp (!) 25   SpO2 98%  Vital signs in last 24 hours: Temp:  [98.3 F (36.8 C)-98.4 F (36.9 C)] 98.4 F (36.9 C) (03/27 1550) Pulse Rate:  [62-75] 71 (03/27 1630) Resp:  [14-25] 25 (03/27 1630) BP: (125-158)/(60-77) 125/61 (03/27 1630) SpO2:  [97 %-100 %] 98 % (03/27 1630)  GENERAL: Awake, alert, sitting up in bed, in no acute distress HEENT: - Normocephalic and atraumatic, without obvious abnormality LUNGS - Normal respiratory effort. Non-labored breathing CV - Extremities warm, without edema ABDOMEN - Soft, non-tender, non-distended Ext: warm, well perfused, without obvious abnormality  NEURO:  Mental Status: Alert and oriented to person, place, time, age, month, year, and situation. He is able to provide a clear a coherent history of present illness.  Speech/Language: speech is intact without aphasia or dysarthria.  Naming, repetition, fluency, and comprehension intact. Cranial Nerves:  II: PERRL 3 mm/brisk. Visual fields full.  III, IV, VI: EOMI. Lid elevation symmetric and full.  V: Sensation is intact  to light touch and symmetrical to face. VII: Face is symmetric resting and smiling. Able to puff cheeks and raise eyebrows.  VIII: Hearing intact to voice IX, X: Palate elevation is symmetric. Phonation normal.  XI: Normal sternocleidomastoid and trapezius muscle strength XII: Tongue protrudes midline without fasciculations.   Motor: 5/5 strength is all muscle groups.  Tone is normal. Bulk is normal.  Sensation: Intact to light touch bilaterally in all four extremities. No extinction to DSS present.  Coordination: FTN intact bilaterally. HKS intact bilaterally. No pronator drift.  DTRs: 2+ and symmetric biceps, 3+ and symmetric patellae  Gait- deferred  NIHSS: 1a Level of Conscious.: 0 1b LOC Questions: 0 1c LOC Commands: 0 2 Best Gaze: 0 3 Visual: 0 4 Facial Palsy: 0 5a Motor Arm - left: 0 5b Motor Arm - Right: 0 6a Motor Leg - Left: 0 6b Motor Leg - Right: 0 7 Limb Ataxia: 0 8 Sensory: 0 9 Best Language: 0 10 Dysarthria: 0 11 Extinct. and Inatten.: 0 TOTAL: 0  Labs I have reviewed labs in epic and the results pertinent to this consultation are: CBC    Component Value Date/Time   WBC 6.1 06/03/2020 1234   RBC 4.60 06/03/2020 1234   HGB 14.8 06/03/2020 1234   HCT 43.7 06/03/2020 1234   PLT 253 06/03/2020 1234   MCV 95.0 06/03/2020 1234   MCH 32.2 06/03/2020 1234   MCHC 33.9 06/03/2020 1234   RDW 13.9 06/03/2020 1234   LYMPHSABS 1.3 06/03/2020 1234   MONOABS 0.8 06/03/2020 1234   EOSABS 0.3 06/03/2020 1234   BASOSABS 0.1 06/03/2020 1234  CMP     Component Value Date/Time   NA 136 06/03/2020 1234   K 4.3 06/03/2020 1234   CL 104 06/03/2020 1234   CO2 25 06/03/2020 1234   GLUCOSE 116 (H) 06/03/2020 1234   BUN 20 06/03/2020 1234   CREATININE 1.06 06/03/2020 1234   CALCIUM 9.4 06/03/2020 1234   PROT 6.9 06/03/2020 1234   ALBUMIN 3.9 06/03/2020 1234   AST 22 06/03/2020 1234   ALT 20 06/03/2020 1234   ALKPHOS 50 06/03/2020 1234  BILITOT 0.7 06/03/2020 1234    GFRNONAA >60 06/03/2020 1234   GFRAA >60 07/10/2017 2345   Lipid Panel     Component Value Date/Time   CHOL 156 10/07/2017 1326   TRIG 120 10/07/2017 1326   HDL 70 10/07/2017 1326   CHOLHDL 2.2 10/07/2017 1326   CHOLHDL 3.2 06/08/2017 0727   VLDL 24 06/08/2017 0727   LDLCALC 62 10/07/2017 1326   Imaging I have reviewed the images obtained:  CT-scan of the brain 3/27: "IMPRESSION: 1. No acute intracranial abnormalities. 2. Chronic small vessel ischemic change and brain atrophy."  MRI examination of the brain pending  Carotid Doppler Duplex completed in October of 2021: Summary:  Right Carotid: Velocities in the right ICA are consistent with a 1-39%  stenosis.  Left Carotid: Velocities in the left ICA are consistent with a 1-39%  stenosis.  Vertebrals: Bilateral vertebral arteries demonstrate antegrade flow.   Assessment: 80 year old male with history as above presenting for evaluation after he experienced a transient episode of expressive aphasia this morning when singing a hymn at church. - Examination reveals patient without neurologic deficit and at baseline - Patient with multiple stroke risk factors including history of TIA, hypertension, diabetes, and advanced age.  - CT imaging without acute abnormalities. MRI/MRA brain pending.  - Presentation most likely consistent with cerebral hypoperfusion / ischemia without infarct. Favor TIA work up.   Impression: History of TIA Evaluate for transient ischemic attack  Recommendations: - HgbA1c  - MRI, MRA of the brain without contrast - Continue home atorvastatin. Goal LDL < 70, increase atorvastatin to 40 mg daily if LDL > 70 - Frequent neuro checks - Echocardiogram - Carotid ultrasound - Prophylactic therapy- Antiplatelet med: Aspirin - continue home aspirin 325 mg PO daily (experienced a reaction to clopidogrel in 2019 with DAPT) - Risk factor modification - Telemetry monitoring; consider 30- day event monitoring  outpatient if no arrhythmias captured inpatient - PT consult, OT consult, Speech consult - NPO until swallow screen cleared  Anibal Henderson, AGAC-NP [facility] Pager: [phone]) [phone]  I have seen and examined the patient. 80 year old male with history as above presenting for evaluation after he experienced a transient episode of expressive aphasia this morning when singing a hymn at church. Neurological exam is nonfocal. Presentation most consistent with TIA. Recommendations as above.  Electronically signed: Dr. Kerney Elbe

## 2020-06-03 NOTE — Progress Notes (Signed)
  Echocardiogram 2D Echocardiogram has been performed.  Timothy Brewer 06/03/2020, 5:15 PM

## 2020-06-03 NOTE — ED Triage Notes (Signed)
Pt reports episode of expressive aphasia while at church that started around 9am and resolved by 10am.  States he was unable to read/process hymn they were singing.  Also reports bilateral hand numbness while driving after church.  VAN negative.  No arm drift.  States symptoms have resolved.

## 2020-06-03 NOTE — ED Triage Notes (Signed)
Screened patient-had episode of described expressive aphasia this am at [facility] while in church. On assessment all deficits have resolved and ambulatory with steady gait

## 2020-06-04 ENCOUNTER — Observation Stay (HOSPITAL_COMMUNITY): Payer: PPO

## 2020-06-04 ENCOUNTER — Other Ambulatory Visit: Payer: Self-pay | Admitting: Internal Medicine

## 2020-06-04 DIAGNOSIS — R4701 Aphasia: Secondary | ICD-10-CM | POA: Diagnosis not present

## 2020-06-04 DIAGNOSIS — G459 Transient cerebral ischemic attack, unspecified: Secondary | ICD-10-CM | POA: Diagnosis not present

## 2020-06-04 LAB — GLUCOSE, CAPILLARY
Glucose-Capillary: 122 mg/dL — ABNORMAL HIGH (ref 70–99)
Glucose-Capillary: 120 mg/dL — ABNORMAL HIGH (ref 70–99)
Glucose-Capillary: 101 mg/dL — ABNORMAL HIGH (ref 70–99)

## 2020-06-04 LAB — LIPID PANEL
Cholesterol: 156 mg/dL (ref 0–200)
HDL: 69 mg/dL (ref >40)
LDL Cholesterol: 64 mg/dL (ref 0–99)
Total CHOL/HDL Ratio: 2.3 RATIO
Triglycerides: 113 mg/dL (ref <150)
VLDL: 23 mg/dL (ref 0–40)

## 2020-06-04 LAB — HEMOGLOBIN A1C
Hgb A1c MFr Bld: 6.3 % — ABNORMAL HIGH (ref 4.8–5.6)
Mean Plasma Glucose: 134.11 mg/dL

## 2020-06-04 MED ORDER — VALSARTAN 80 MG PO TABS: 80.0000 mg | ORAL_TABLET | Freq: Every day | ORAL | Status: DC

## 2020-06-04 MED ORDER — METFORMIN HCL ER 500 MG PO TB24: 500.0000 mg | ORAL_TABLET | Freq: Two times a day (BID) | ORAL | Status: DC

## 2020-06-04 MED ORDER — TICAGRELOR 90 MG PO TABS: 90.0000 mg | ORAL_TABLET | Freq: Two times a day (BID) | ORAL | 0 refills | Status: DC

## 2020-06-04 MED ORDER — ASPIRIN EC 81 MG PO TBEC: 81.0000 mg | DELAYED_RELEASE_TABLET | Freq: Every day | ORAL | 2 refills | Status: DC

## 2020-06-04 MED FILL — BRILINTA 90 MG TABLET: 90 | 30 days supply | Qty: 60 | Fill #0

## 2020-06-04 NOTE — Discharge Summary (Signed)
Physician Discharge Summary  Timothy Brewer YHC:[id number] DOB: 10-04-1940 DOA: 06/03/2020  PCP: Antony Contras, MD  Admit date: 06/03/2020 Discharge date: 06/04/2020  Admitted From: home Discharge disposition: home   Recommendations for Outpatient Follow-Up:   1. Asa plus brilenta for 4 weeks and ASA (81mg ) alone after 2. Echo pending 3. EEG results pending   Discharge Diagnosis:   Active Problems:   TIA (transient ischemic attack)    Discharge Condition: Improved.  Diet recommendation: Low sodium, heart healthy  Wound care: None.  Code status: Full.   History of Present Illness:  Timothy Brewer is a 80 y.o. male with medical history significant of IIDM, HTN, HLD, TIA, presented with sudden onset of confusion and speech problem.  Patient was at surgery service this morning, singing hymns, and suddenly became confused and could not understand the meaning/pronounciation on the hymn book, denied any vision problem or weakness or numbness. Whole episode lasted about 25-30 min and fully resolved before EMS arrived. He also reported he had one time right leg weakness when waking up this morning, but only lasted few minutes.   Hospital Course by Problem:   TIA -Transient right lower extremity weakness and receptive aphasia -Echocardiogram: pending -ASA plus brilenta and then ASA alone -FLP: 64 -HgbA1c: 6.3  HTN -permissive HTN  DM -resume home meds    Medical Consultants:   neuro   Discharge Exam:   Vitals:   06/04/20 0345 06/04/20 0543  BP: (!) 115/45 (!) 124/59  Pulse: 67 64  Resp: 18 18  Temp: 97.7 F (36.5 C) 97.7 F (36.5 C)  SpO2: 99% 95%   Vitals:   06/03/20 2351 06/04/20 0145 06/04/20 0345 06/04/20 0543  BP: 139/72 (!) 143/74 (!) 115/45 (!) 124/59  Pulse: 78 66 67 64  Resp: 18 19 18 18   Temp: 98.4 F (36.9 C)  97.7 F (36.5 C) 97.7 F (36.5 C)  TempSrc: Oral Oral Oral Oral  SpO2: 97% 94% 99% 95%  Weight:      Height:         General exam: Appears calm and comfortable.   The results of significant diagnostics from this hospitalization (including imaging, microbiology, ancillary and laboratory) are listed below for reference.     Procedures and Diagnostic Studies:   CT HEAD WO CONTRAST  Result Date: 06/03/2020 CLINICAL DATA:  Aphasia.  Neurologic deficit. EXAM: CT HEAD WITHOUT CONTRAST TECHNIQUE: Contiguous axial images were obtained from the base of the skull through the vertex without intravenous contrast. COMPARISON:  MRI brain 03/31/2019 FINDINGS: Brain: No evidence of acute infarction, hemorrhage, hydrocephalus, extra-axial collection or mass lesion/mass effect. There is mild diffuse low-attenuation within the subcortical and periventricular white matter compatible with chronic microvascular disease. Mild prominence of sulci and ventricles compatible with age related brain atrophy. Vascular: No hyperdense vessel or unexpected calcification. Skull: Normal. Negative for fracture or focal lesion. Sinuses/Orbits: Mild mucosal thickening of the maxillary sinuses. Mastoid air cells appear clear. Other: None. IMPRESSION: 1. No acute intracranial abnormalities. 2. Chronic small vessel ischemic change and brain atrophy. Electronically Signed   By: Kerby Moors M.D.   On: 06/03/2020 13:56   MR ANGIO HEAD WO CONTRAST  Result Date: 06/03/2020 CLINICAL DATA:  Confusion and abnormal speech EXAM: MRI HEAD WITHOUT CONTRAST MRA HEAD WITHOUT CONTRAST TECHNIQUE: Multiplanar, multiecho pulse sequences of the brain and surrounding structures were obtained without intravenous contrast. Angiographic images of the head were obtained using MRA technique without contrast. COMPARISON:  03/31/2019  FINDINGS: MRI HEAD Brain: There is no acute infarction or intracranial hemorrhage. There is no intracranial mass, mass effect, or edema. There is no hydrocephalus or extra-axial fluid collection. Patchy foci of T2 hyperintensity in the  supratentorial white matter are nonspecific but probably reflect stable mild chronic microvascular ischemic changes. Prominence of the ventricles and sulci reflects stable generalized parenchymal volume loss. Vascular: Major vessel flow voids at the skull base are preserved. Skull and upper cervical spine: Normal marrow signal is preserved. Sinuses/Orbits: Mild mucosal thickening. Bilateral lens replacements. Other: Sella is unremarkable.  Mastoid air cells are clear. MRA HEAD Intracranial internal carotid arteries are patent. Middle and anterior cerebral arteries are patent. Intracranial vertebral arteries, basilar artery, posterior cerebral arteries are patent. There is no significant stenosis or aneurysm. IMPRESSION: No evidence of acute infarction, hemorrhage, or mass. Mild chronic microvascular ischemic changes. Normal MRA head. Electronically Signed   By: Macy Mis M.D.   On: 06/03/2020 19:45   MR BRAIN WO CONTRAST  Result Date: 06/03/2020 CLINICAL DATA:  Confusion and abnormal speech EXAM: MRI HEAD WITHOUT CONTRAST MRA HEAD WITHOUT CONTRAST TECHNIQUE: Multiplanar, multiecho pulse sequences of the brain and surrounding structures were obtained without intravenous contrast. Angiographic images of the head were obtained using MRA technique without contrast. COMPARISON:  03/31/2019 FINDINGS: MRI HEAD Brain: There is no acute infarction or intracranial hemorrhage. There is no intracranial mass, mass effect, or edema. There is no hydrocephalus or extra-axial fluid collection. Patchy foci of T2 hyperintensity in the supratentorial white matter are nonspecific but probably reflect stable mild chronic microvascular ischemic changes. Prominence of the ventricles and sulci reflects stable generalized parenchymal volume loss. Vascular: Major vessel flow voids at the skull base are preserved. Skull and upper cervical spine: Normal marrow signal is preserved. Sinuses/Orbits: Mild mucosal thickening. Bilateral lens  replacements. Other: Sella is unremarkable.  Mastoid air cells are clear. MRA HEAD Intracranial internal carotid arteries are patent. Middle and anterior cerebral arteries are patent. Intracranial vertebral arteries, basilar artery, posterior cerebral arteries are patent. There is no significant stenosis or aneurysm. IMPRESSION: No evidence of acute infarction, hemorrhage, or mass. Mild chronic microvascular ischemic changes. Normal MRA head. Electronically Signed   By: Macy Mis M.D.   On: 06/03/2020 19:45     Labs:   Basic Metabolic Panel: Recent Labs  Lab 06/03/20 1234  NA 136  K 4.3  CL 104  CO2 25  GLUCOSE 116*  BUN 20  CREATININE 1.06  CALCIUM 9.4   GFR Estimated Creatinine Clearance: 56.1 mL/min (by C-G formula based on SCr of 1.06 mg/dL). Liver Function Tests: Recent Labs  Lab 06/03/20 1234  AST 22  ALT 20  ALKPHOS 50  BILITOT 0.7  PROT 6.9  ALBUMIN 3.9   No results for input(s): LIPASE, AMYLASE in the last 168 hours. No results for input(s): AMMONIA in the last 168 hours. Coagulation profile Recent Labs  Lab 06/03/20 1234  INR 0.9    CBC: Recent Labs  Lab 06/03/20 1234  WBC 6.1  NEUTROABS 3.6  HGB 14.8  HCT 43.7  MCV 95.0  PLT 253   Cardiac Enzymes: No results for input(s): CKTOTAL, CKMB, CKMBINDEX, TROPONINI in the last 168 hours. BNP: Invalid input(s): POCBNP CBG: Recent Labs  Lab 06/03/20 1803 06/03/20 2152 06/04/20 0557  GLUCAP 115* 122* 120*   D-Dimer No results for input(s): DDIMER in the last 72 hours. Hgb A1c Recent Labs    06/04/20 0753  HGBA1C 6.3*   Lipid Profile Recent Labs  06/04/20 0753  CHOL 156  HDL 69  LDLCALC 64  TRIG 113  CHOLHDL 2.3   Thyroid function studies No results for input(s): TSH, T4TOTAL, T3FREE, THYROIDAB in the last 72 hours.  Invalid input(s): FREET3 Anemia work up No results for input(s): VITAMINB12, FOLATE, FERRITIN, TIBC, IRON, RETICCTPCT in the last 72  hours. Microbiology Recent Results (from the past 240 hour(s))  Resp Panel by RT-PCR (Flu A&B, Covid) Nasopharyngeal Swab     Status: None   Collection Time: 06/03/20  2:33 PM   Specimen: Nasopharyngeal Swab; Nasopharyngeal(NP) swabs in vial transport medium  Result Value Ref Range Status   SARS Coronavirus 2 by RT PCR NEGATIVE NEGATIVE Final    Comment: (NOTE) SARS-CoV-2 target nucleic acids are NOT DETECTED.  The SARS-CoV-2 RNA is generally detectable in upper respiratory specimens during the acute phase of infection. The lowest concentration of SARS-CoV-2 viral copies this assay can detect is 138 copies/mL. A negative result does not preclude SARS-Cov-2 infection and should not be used as the sole basis for treatment or other patient management decisions. A negative result may occur with  improper specimen collection/handling, submission of specimen other than nasopharyngeal swab, presence of viral mutation(s) within the areas targeted by this assay, and inadequate number of viral copies(<138 copies/mL). A negative result must be combined with clinical observations, patient history, and epidemiological information. The expected result is Negative.  Fact Sheet for Patients:  EntrepreneurPulse.com.au  Fact Sheet for Healthcare Providers:  IncredibleEmployment.be  This test is no t yet approved or cleared by the [place] FDA and  has been authorized for detection and/or diagnosis of SARS-CoV-2 by FDA under an Emergency Use Authorization (EUA). This EUA will remain  in effect (meaning this test can be used) for the duration of the COVID-19 declaration under Section 564(b)(1) of the Act, 21 U.S.C.section 360bbb-3(b)(1), unless the authorization is terminated  or revoked sooner.       Influenza A by PCR NEGATIVE NEGATIVE Final   Influenza B by PCR NEGATIVE NEGATIVE Final    Comment: (NOTE) The Xpert Xpress SARS-CoV-2/FLU/RSV plus assay  is intended as an aid in the diagnosis of influenza from Nasopharyngeal swab specimens and should not be used as a sole basis for treatment. Nasal washings and aspirates are unacceptable for Xpert Xpress SARS-CoV-2/FLU/RSV testing.  Fact Sheet for Patients: EntrepreneurPulse.com.au  Fact Sheet for Healthcare Providers: IncredibleEmployment.be  This test is not yet approved or cleared by the [place] FDA and has been authorized for detection and/or diagnosis of SARS-CoV-2 by FDA under an Emergency Use Authorization (EUA). This EUA will remain in effect (meaning this test can be used) for the duration of the COVID-19 declaration under Section 564(b)(1) of the Act, 21 U.S.C. section 360bbb-3(b)(1), unless the authorization is terminated or revoked.  Performed at [facility], [place] [address] [place], [place] [address]      Discharge Instructions:   Discharge Instructions    Diet - low sodium heart healthy   Complete by: As directed    Discharge instructions   Complete by: As directed    ASA 81 mg and brilinta x 4 weeks then ASA 81 mg daily   Increase activity slowly   Complete by: As directed      Allergies as of 06/04/2020      Reactions   Iodine Hives, Other (See Comments)   Internal only      Medication List    TAKE these medications   AMOXICILLIN PO Take 500  mg by mouth. For dental work   aspirin EC 81 MG tablet Take 1 tablet (81 mg total) by mouth daily. Swallow whole. What changed:   medication strength  how much to take  additional instructions   atorvastatin 20 MG tablet Commonly known as: LIPITOR Take 1 tablet (20 mg total) by mouth daily at 6 PM.   CALCIUM 600 + D PO Take 1 tablet by mouth daily.   ferrous sulfate 325 (65 FE) MG tablet Commonly known as: FerrouSul Take 1 tablet (325 mg total) by mouth 3 (three) times daily with meals.   fexofenadine 180 MG tablet Commonly known as:  ALLEGRA Take 180 mg by mouth every evening.   KLS Aller-Tec 10 MG tablet Generic drug: cetirizine Take 10 mg by mouth daily.   magnesium oxide 400 MG tablet Commonly known as: MAG-OX Take 400 mg by mouth daily.   metFORMIN 500 MG 24 hr tablet Commonly known as: GLUCOPHAGE-XR Take 1 tablet (500 mg total) by mouth 2 (two) times daily. Start taking on: June 05, 2020   MULTIVITAMIN ADULT PO multivitamin   omeprazole 20 MG capsule Commonly known as: PRILOSEC omeprazole 20 mg capsule,delayed release  TAKE 1 CAPSULE BY MOUTH TWICE DAILY   polyethylene glycol 17 g packet Commonly known as: MIRALAX / GLYCOLAX Take 17 g by mouth 2 (two) times daily. What changed:   when to take this  reasons to take this   PRESERVISION AREDS PO Take by mouth 2 (two) times daily.   Prevagen 10 MG Caps Generic drug: Apoaequorin Take by mouth.   sildenafil 20 MG tablet Commonly known as: REVATIO Take 40-100 mg by mouth daily as needed.   ticagrelor 90 MG Tabs tablet Commonly known as: Brilinta Take 1 tablet (90 mg total) by mouth 2 (two) times daily.   valsartan 80 MG tablet Commonly known as: DIOVAN Take 1 tablet (80 mg total) by mouth daily. Start taking on: June 06, 2020 What changed: These instructions start on June 06, 2020. If you are unsure what to do until then, ask your doctor or other care provider.   vitamin B-12 500 MCG tablet Commonly known as: CYANOCOBALAMIN Take 500 mcg by mouth daily.         Time coordinating discharge: 25 min  Signed:  Geradine Girt DO  [facility] 06/04/2020, 10:43 AM

## 2020-06-04 NOTE — Progress Notes (Signed)
RN was on lunch and the patient stated to [name] that he was leaving in 21minutes because he had been waiting all day has been ready to DC since this AM. [name], NT notified RN and I went and saw the patient and he stated the same to me, RN went over paperwork with the patient and he stated understanding IV has been removed and medications have been dropped of wife at bedside for DC.

## 2020-06-04 NOTE — Progress Notes (Signed)
STROKE TEAM PROGRESS NOTE   INTERVAL HISTORY No acute events overnight.  VSS. Afebrile.  Reports onset of episode at church yesterday morning when he realized he could not read/comprehend lyrics in hymnal and unable to sing x 20-30 minutes. Numb in both hands transiently around 20 minutes. Also complained of right leg weakness upon awakening yesterday and resolution by the time he went to bed yesterday.   C/p ongoing reading difficulty with comprehension.  EEG is pending  We discussed his history, current diagnostic findings, concerns and plan of care. We discussed likely going home on ASA 81mg  daily and brillinta x one month and then ASA 81mg  alone.   Vitals:   06/03/20 2351 06/04/20 0145 06/04/20 0345 06/04/20 0543  BP: 139/72 (!) 143/74 (!) 115/45 (!) 124/59  Pulse: 78 66 67 64  Resp: 18 19 18 18   Temp: 98.4 F (36.9 C)  97.7 F (36.5 C) 97.7 F (36.5 C)  TempSrc: Oral Oral Oral Oral  SpO2: 97% 94% 99% 95%  Weight:      Height:       CBC:  Recent Labs  Lab 06/03/20 1234  WBC 6.1  NEUTROABS 3.6  HGB 14.8  HCT 43.7  MCV 95.0  PLT 932   Basic Metabolic Panel:  Recent Labs  Lab 06/03/20 1234  NA 136  K 4.3  CL 104  CO2 25  GLUCOSE 116*  BUN 20  CREATININE 1.06  CALCIUM 9.4   Lipid Panel:  Recent Labs  Lab 06/04/20 0753  CHOL 156  TRIG 113  HDL 69  CHOLHDL 2.3  VLDL 23  LDLCALC 64   HgbA1c:  Recent Labs  Lab 06/04/20 0753  HGBA1C 6.3*   Urine Drug Screen: No results for input(s): LABOPIA, COCAINSCRNUR, LABBENZ, AMPHETMU, THCU, LABBARB in the last 168 hours.  Alcohol Level No results for input(s): ETH in the last 168 hours.  IMAGING past 24 hours CT HEAD WO CONTRAST  Result Date: 06/03/2020 CLINICAL DATA:  Aphasia.  Neurologic deficit. EXAM: CT HEAD WITHOUT CONTRAST TECHNIQUE: Contiguous axial images were obtained from the base of the skull through the vertex without intravenous contrast. COMPARISON:  MRI brain 03/31/2019 FINDINGS: Brain: No  evidence of acute infarction, hemorrhage, hydrocephalus, extra-axial collection or mass lesion/mass effect. There is mild diffuse low-attenuation within the subcortical and periventricular white matter compatible with chronic microvascular disease. Mild prominence of sulci and ventricles compatible with age related brain atrophy. Vascular: No hyperdense vessel or unexpected calcification. Skull: Normal. Negative for fracture or focal lesion. Sinuses/Orbits: Mild mucosal thickening of the maxillary sinuses. Mastoid air cells appear clear. Other: None. IMPRESSION: 1. No acute intracranial abnormalities. 2. Chronic small vessel ischemic change and brain atrophy. Electronically Signed   By: Kerby Moors M.D.   On: 06/03/2020 13:56   MR ANGIO HEAD WO CONTRAST  Result Date: 06/03/2020 CLINICAL DATA:  Confusion and abnormal speech EXAM: MRI HEAD WITHOUT CONTRAST MRA HEAD WITHOUT CONTRAST TECHNIQUE: Multiplanar, multiecho pulse sequences of the brain and surrounding structures were obtained without intravenous contrast. Angiographic images of the head were obtained using MRA technique without contrast. COMPARISON:  03/31/2019 FINDINGS: MRI HEAD Brain: There is no acute infarction or intracranial hemorrhage. There is no intracranial mass, mass effect, or edema. There is no hydrocephalus or extra-axial fluid collection. Patchy foci of T2 hyperintensity in the supratentorial white matter are nonspecific but probably reflect stable mild chronic microvascular ischemic changes. Prominence of the ventricles and sulci reflects stable generalized parenchymal volume loss. Vascular: Major vessel flow  voids at the skull base are preserved. Skull and upper cervical spine: Normal marrow signal is preserved. Sinuses/Orbits: Mild mucosal thickening. Bilateral lens replacements. Other: Sella is unremarkable.  Mastoid air cells are clear. MRA HEAD Intracranial internal carotid arteries are patent. Middle and anterior cerebral arteries  are patent. Intracranial vertebral arteries, basilar artery, posterior cerebral arteries are patent. There is no significant stenosis or aneurysm. IMPRESSION: No evidence of acute infarction, hemorrhage, or mass. Mild chronic microvascular ischemic changes. Normal MRA head. Electronically Signed   By: Macy Mis M.D.   On: 06/03/2020 19:45   MR BRAIN WO CONTRAST  Result Date: 06/03/2020 CLINICAL DATA:  Confusion and abnormal speech EXAM: MRI HEAD WITHOUT CONTRAST MRA HEAD WITHOUT CONTRAST TECHNIQUE: Multiplanar, multiecho pulse sequences of the brain and surrounding structures were obtained without intravenous contrast. Angiographic images of the head were obtained using MRA technique without contrast. COMPARISON:  03/31/2019 FINDINGS: MRI HEAD Brain: There is no acute infarction or intracranial hemorrhage. There is no intracranial mass, mass effect, or edema. There is no hydrocephalus or extra-axial fluid collection. Patchy foci of T2 hyperintensity in the supratentorial white matter are nonspecific but probably reflect stable mild chronic microvascular ischemic changes. Prominence of the ventricles and sulci reflects stable generalized parenchymal volume loss. Vascular: Major vessel flow voids at the skull base are preserved. Skull and upper cervical spine: Normal marrow signal is preserved. Sinuses/Orbits: Mild mucosal thickening. Bilateral lens replacements. Other: Sella is unremarkable.  Mastoid air cells are clear. MRA HEAD Intracranial internal carotid arteries are patent. Middle and anterior cerebral arteries are patent. Intracranial vertebral arteries, basilar artery, posterior cerebral arteries are patent. There is no significant stenosis or aneurysm. IMPRESSION: No evidence of acute infarction, hemorrhage, or mass. Mild chronic microvascular ischemic changes. Normal MRA head. Electronically Signed   By: Macy Mis M.D.   On: 06/03/2020 19:45   PHYSICAL EXAM  Temp:  [97.6 F (36.4 C)-98.4  F (36.9 C)] 97.7 F (36.5 C) (03/28 0543) Pulse Rate:  [62-81] 64 (03/28 0543) Resp:  [14-25] 18 (03/28 0543) BP: (115-158)/(45-77) 124/59 (03/28 0543) SpO2:  [94 %-100 %] 95 % (03/28 0543) Weight:  [79.4 kg] 79.4 kg (03/27 1751)  GENERAL: Awake, alert, sitting up in bed, in no acute distress HEENT: - Normocephalic and atraumatic, without obvious abnormality LUNGS - Normal respiratory effort. Non-labored breathing CV - Extremities warm, without edema ABDOMEN - Soft, non-tender, non-distended Ext: warm, well perfused, without obvious abnormality  NEURO:  Mental Status: Alert and oriented to person, place, time, age, month, year, and situation. He is able to provide a clear a coherent history of present illness.  Speech/Language: speech is intact without aphasia or dysarthria.  Naming, repetition, fluency, and comprehension intact. Cranial Nerves:  II: PERRL 3 mm/brisk. Visual fields full.  III, IV, VI: EOMI. Lid elevation symmetric and full.  V: Sensation is intact to light touch and symmetrical to face. VII: Face is symmetric resting and smiling. Able to puff cheeks and raise eyebrows.  VIII: Hearing intact to voice IX, X: Palate elevation is symmetric. Phonation normal.  XI: Normal sternocleidomastoid and trapezius muscle strength XII: Tongue protrudes midline without fasciculations.   Motor: 5/5 strength is all muscle groups.  Tone is normal. Bulk is normal.  Sensation: Intact to light touch bilaterally in all four extremities. No extinction to DSS present.  Coordination: FTN intact bilaterally. HKS intact bilaterally. No pronator drift.  DTRs: 2+ and symmetric biceps, 3+ and symmetric patellae  Gait- deferred  ASSESSMENT/PLAN Mr. Duy Lemming  Goines is a 80 y.o. male with history of  presenting with    Stroke like episode, possible TIA  with negative stroke work up.    Code Stroke  No acute intracranial abnormalities. Chronic small vessel ischemic change and brain  atrophy.  MRI   No evidence of acute infarction, hemorrhage, or mass. Mild chronic microvascular ischemic changes  MRA   No evidence of acute infarction, hemorrhage, or mass. Mild chronic microvascular ischemic changes. Normal MRA head.  Carotid Doppler: Right Carotid: Velocities in the right ICA are consistent with a 1-39% stenosis.  Left Carotid: Velocities in the left ICA are consistent with a 1-39% stenosis.  Vertebrals: Bilateral vertebral arteries demonstrate antegrade flow.   2D Echo: done, with read pending  EEG read is normal. No seizure activity.   LDL 64  HgbA1c 6.3    Diet   Diet heart healthy/carb modified Room service appropriate? Yes; Fluid consistency: Thin     Therapy recommendations:    Disposition:  Home   Hypertension . Permissive hypertension (OK if < 220/120) but gradually normalize in 5-7 days . Long-term BP goal normotensive  Hyperlipidemia  Home meds:  Lipitor 20 mg   LDL at goal 64, goal < 70  Continue statin at discharge  Other Stroke Risk Factors  Advanced Age >/= 79   Former Cigarette smoker  History of CVA  Current ETOH use of one drink per week, alcohol level <10, advised to drink no more than 2 drink(s) a day  Overweight,  Body mass index is 27.42 kg/m., BMI >/= 30 associated with increased stroke risk, recommend weight loss, diet and exercise as appropriate   Family hx stroke (Maternal Grandmother)  Other [facility] day # 0 I have personally obtained history,examined this patient, reviewed notes, independently viewed imaging studies, participated in medical decision making and plan of care.ROS completed by me personally and pertinent positives fully documented  I have made any additions or clarifications directly to the above note. Agree with note above.  He presented with transient episode of confusion and possibly some leg weakness probably posterior circulation TIA versus seizure with postictal  confusion.  MRI scan of the brain and MRI of the brain are both unremarkable.  Recommend check EEG for seizure activity.  Aspirin 81 mg daily and Brilinta 90 mg twice daily for 4 weeks followed by aspirin alone.  Aggressive risk factor modification.  Check echocardiogram results and patient may be discharged home after that.  Follow-up as an outpatient stroke clinic in 6 weeks.  Greater than 50% time during this 25-minute visit was spent in counseling and coordination of care about TIA versus seizure and answering questions. Antony Contras, MD Medical Director [place] Pager: [phone] 06/04/2020 4:34 PM    To contact Stroke Continuity provider, please refer to [email or web address] After hours, contact General Neurology

## 2020-06-04 NOTE — Progress Notes (Signed)
EEG complete - results pending 

## 2020-06-04 NOTE — Procedures (Signed)
  PROCEDURE: EEG routine study  DATES OF TEST: 06/04/2020  REASON FOR TEST: aphasia  AED/Sedative MEDICATIONS: n/a  TECHNIQUE: This is an 18 channel digital EEG recording with time locked video done using the standard international 10/20 system of electrode placement with one channel EKG recording. Pt was awake and drowsy during this recording.  FINDINGS:  Background rhythm: symmetric, posterior dominant 9 Hz frequencies Amplitude : Normal Continuity: Yes Breach effect: No Variability: Yes Reactivity: Yes Rhythmic delta activity: No Periodic discharges: No Sporadic epileptiform discharges: No  Electrographic/electroclinical seizures: No   Limited EKG reveals a stable rhythm.   IMPRESSION:  This is a normal study. A normal EEG does not exclude a diagnosis of epilepsy.

## 2020-06-04 NOTE — Evaluation (Signed)
Physical Therapy Evaluation Patient Details Name: Timothy Brewer MRN: [id number] DOB: Jul 14, 1940 Today's Date: 06/04/2020   History of Present Illness  80 y.o. male presenting to ED 3/27 after an episode of aphasia, bilateral hand numbness and R leg weakness (has since resolved). Patient admitted under obsrvation for suspected TIA. PMHx significant for HTN, DMII, Hx of TIA in 2019, lumbar surgery, cervical surgery and THA.    Clinical Impression  Pt OOB in recliner upon arrival of PT, agreeable to evaluation at this time. Prior to admission the pt was completely independent with all mobility, driving, and retired. The pt now presents with minor limitations in functional mobility, and dynamic stability, but is safe to return home with family supervision when medically cleared. He was able to complete multiple transfers without assist or AD, completed hallway ambulation and stair navigation with good stability, speed of movement, and no physical assist or UE support needed. The pt reports he is at his baseline level of function and mobility. No differences noted in strength, sensation, or coordination of his LE. All education regarding stroke, safety with mobility at home, and walking HEP completed. No further acute PT needs, thank you for the consult.       Follow Up Recommendations No PT follow up;Supervision - Intermittent    Equipment Recommendations  None recommended by PT    Recommendations for Other Services       Precautions / Restrictions Precautions Precautions: None Restrictions Weight Bearing Restrictions: No      Mobility  Bed Mobility Overal bed mobility: Independent                  Transfers Overall transfer level: Independent Equipment used: None             General transfer comment: no assistance needed, no increased effort or time, no evidence of instability  Ambulation/Gait Ambulation/Gait assistance: Independent Gait Distance (Feet): 200  Feet Assistive device: None Gait Pattern/deviations: WFL(Within Functional Limits) Gait velocity: 0.9 m/s Gait velocity interpretation: >2.62 ft/sec, indicative of community ambulatory General Gait Details: able to maintain good stability even with changed in speed, quick turns or stops, backwards walking, and narrow BOS.  Stairs Stairs: Yes Stairs assistance: Independent Stair Management: No rails;One rail Right;Alternating pattern;Forwards Number of Stairs: 3 (x3) General stair comments: pt able to complete without UE support or rail use, but reports improved confidence with use of rail for single UEsupport      Balance Overall balance assessment: Mild deficits observed, not formally tested (mild deficits with walking with narrow base of support (tandem walking))                               Standardized Balance Assessment Standardized Balance Assessment : Dynamic Gait Index   Dynamic Gait Index Level Surface: Normal Change in Gait Speed: Normal Gait with Horizontal Head Turns: Normal Gait with Vertical Head Turns: Normal Gait and Pivot Turn: Normal Step Over Obstacle: Mild Impairment Step Around Obstacles: Normal Steps: Normal Total Score: 23       Pertinent Vitals/Pain Pain Assessment: No/denies pain    Home Living Family/patient expects to be discharged to:: Private residence Living Arrangements: Spouse/significant other Available Help at Discharge: Family;Available 24 hours/day Type of Home: House Home Access: Level entry     Home Layout: One level Home Equipment: Walker - 4 wheels;Cane - single point      Prior Function Level of Independence: Independent  Comments: Independent with ADLs/IADLs without AD; drives; hired help for heavy cleaning     Hand Dominance   Dominant Hand: Right    Extremity/Trunk Assessment   Upper Extremity Assessment Upper Extremity Assessment: Overall WFL for tasks assessed    Lower Extremity  Assessment Lower Extremity Assessment: Overall WFL for tasks assessed (pt denies difference in sensation, no difference in strength or coordination noted)    Cervical / Trunk Assessment Cervical / Trunk Assessment: Normal  Communication   Communication: No difficulties  Cognition Arousal/Alertness: Awake/alert Behavior During Therapy: WFL for tasks assessed/performed Overall Cognitive Status: Within Functional Limits for tasks assessed                                        General Comments General comments (skin integrity, edema, etc.): VSS through session, discussed walking program HEP    Exercises     Assessment/Plan    PT Assessment Patent does not need any further PT services  PT Problem List         PT Treatment Interventions      PT Goals (Current goals can be found in the Care Plan section)  Acute Rehab PT Goals Patient Stated Goal: To return home. PT Goal Formulation: With patient Time For Goal Achievement: 06/18/20 Potential to Achieve Goals: Good     AM-PAC PT "6 Clicks" Mobility  Outcome Measure Help needed turning from your back to your side while in a flat bed without using bedrails?: None Help needed moving from lying on your back to sitting on the side of a flat bed without using bedrails?: None Help needed moving to and from a bed to a chair (including a wheelchair)?: None Help needed standing up from a chair using your arms (e.g., wheelchair or bedside chair)?: None Help needed to walk in hospital room?: None Help needed climbing 3-5 steps with a railing? : A Little 6 Click Score: 23    End of Session Equipment Utilized During Treatment: Gait belt Activity Tolerance: Patient tolerated treatment well Patient left: in chair;with nursing/sitter in room Nurse Communication: Mobility status (no PT needs) PT Visit Diagnosis: Other abnormalities of gait and mobility (R26.89)    Time: 8144-8185 PT Time Calculation (min) (ACUTE ONLY):  14 min   Charges:   PT Evaluation $PT Eval Low Complexity: 1 Low          Karma Ganja, PT, DPT   Acute Rehabilitation Department Pager #: [phone]  Otho Bellows 06/04/2020, 8:44 AM

## 2020-06-04 NOTE — Evaluation (Signed)
Occupational Therapy Evaluation Patient Details Name: Timothy Brewer MRN: [id number] DOB: 06/14/1940 Today's Date: 06/04/2020    History of Present Illness 80 y.o. male presenting to ED 3/27 after an episode of aphasia, bilateral hand numbness and R leg weakness (has since resolved). Patient admitted under obsrvation for suspected TIA. PMHx significant for HTN, DMII, Hx of TIA in 2019, lumbar surgery, cervical surgery and THA.   Clinical Impression   PTA patient was independent with ADLs/IADLs without AD and was living with his significant other. Patient currently functioning at baseline demonstrating all ADLs with I. Patient also ambulated household and community distances up to 259ft without AD and I. Patient does not require continued acute occupational therapy services at this time with OT to sign off.     Follow Up Recommendations  No OT follow up    Equipment Recommendations  None recommended by OT    Recommendations for Other Services       Precautions / Restrictions Precautions Precautions: None Restrictions Weight Bearing Restrictions: No      Mobility Bed Mobility Overal bed mobility: Independent                  Transfers Overall transfer level: Independent               General transfer comment: All observed transfers with I and no AD.    Balance Overall balance assessment: No apparent balance deficits (not formally assessed)                                         ADL either performed or assessed with clinical judgement   ADL Overall ADL's : At baseline                                             Vision Baseline Vision/History: No visual deficits Vision Assessment?: No apparent visual deficits     Perception     Praxis      Pertinent Vitals/Pain Pain Assessment: No/denies pain     Hand Dominance Right   Extremity/Trunk Assessment Upper Extremity Assessment Upper Extremity Assessment:  Overall WFL for tasks assessed   Lower Extremity Assessment Lower Extremity Assessment: Overall WFL for tasks assessed   Cervical / Trunk Assessment Cervical / Trunk Assessment: Normal   Communication Communication Communication: No difficulties   Cognition Arousal/Alertness: Awake/alert Behavior During Therapy: WFL for tasks assessed/performed Overall Cognitive Status: Within Functional Limits for tasks assessed                                     General Comments       Exercises     Shoulder Instructions      Home Living Family/patient expects to be discharged to:: Private residence Living Arrangements: Spouse/significant other Available Help at Discharge: Family;Available 24 hours/day Type of Home: House Home Access: Level entry     Home Layout: One level     Bathroom Shower/Tub: Occupational psychologist: Handicapped height     Home Equipment: Environmental consultant - 4 wheels;Cane - single point          Prior Functioning/Environment Level of Independence: Independent        Comments:  Independent with ADLs/IADLs without AD; drives; hired help for heavy cleaning        OT Problem List:        OT Treatment/Interventions:      OT Goals(Current goals can be found in the care plan section) Acute Rehab OT Goals Patient Stated Goal: To return home. OT Goal Formulation: With patient  OT Frequency:     Barriers to D/C:            Co-evaluation              AM-PAC OT "6 Clicks" Daily Activity     Outcome Measure Help from another person eating meals?: None Help from another person taking care of personal grooming?: None Help from another person toileting, which includes using toliet, bedpan, or urinal?: None Help from another person bathing (including washing, rinsing, drying)?: None Help from another person to put on and taking off regular upper body clothing?: None Help from another person to put on and taking off regular lower  body clothing?: None 6 Click Score: 24   End of Session Nurse Communication: Mobility status;Other (comment) (Patient ok to be indepenent in room.)  Activity Tolerance: Patient tolerated treatment well Patient left: in bed;with call bell/phone within reach  OT Visit Diagnosis: Muscle weakness (generalized) (M62.81)                Time: 6967-8938 OT Time Calculation (min): 13 min Charges:  OT General Charges $OT Visit: 1 Visit OT Evaluation $OT Eval Low Complexity: 1 Low  [name] H. OTR/L Supplemental OT, Department of rehab services [phone]  [name] R H. 06/04/2020, 8:23 AM

## 2020-06-04 NOTE — Progress Notes (Signed)
SLP Cancellation Note  Patient Details Name: Timothy Brewer MRN: [id number] DOB: Feb 23, 1941   Cancelled treatment:       Reason Eval/Treat Not Completed: SLP screened, no needs identified, will sign off   Juan Quam Laurice 06/04/2020, 2:07 PM

## 2020-06-06 DIAGNOSIS — H10501 Unspecified blepharoconjunctivitis, right eye: Secondary | ICD-10-CM | POA: Diagnosis not present

## 2020-06-12 ENCOUNTER — Telehealth: Payer: Self-pay | Admitting: Neurology

## 2020-06-12 NOTE — Telephone Encounter (Signed)
LVM to return call.

## 2020-06-12 NOTE — Telephone Encounter (Signed)
Pt called stating that ever since he started the ticagrelor (BRILINTA) 90 MG TABS tablet his eye all of a sudden became bloodshot and he is wanting to speak to the RN regarding this. Please advise.

## 2020-06-14 NOTE — Telephone Encounter (Signed)
Called pt back. Relayed Dr. Clydene Fake message. He verbalized understanding and appreciation for call.

## 2020-06-14 NOTE — Telephone Encounter (Signed)
LVM again returning pt call.

## 2020-06-14 NOTE — Telephone Encounter (Signed)
Kindly inform the patient that alcohol usage more than 1 drink is considered harmful and may also increase risk for bruising and bleeding particularly when you are on Brilinta and aspirin.  He likely has a tiny leaky blood vessel in the bleeding should stop.  If it is affecting his vision or getting worse he may need to see an eye doctor.

## 2020-06-14 NOTE — Telephone Encounter (Signed)
Took call from phone staff and spoke w/ pt.  He started Brilinta 90mg  po BID this past Monday when he got out of the hospital. He did go out on Tuesday night and had about 5 glasses of wine. He read after that he should not drink a lot while on this medication. Now the left side of his left eye is bloodshot. Denies any pain, discomfort or discharge from eye. Denies starting any other new medications recently. Did change ASA from 325 to 81 po qd per recommendation. Denies any heavy lifting recently and has not strained with any bowel movements. Advised I will send message to MD and call back with his recommendation once he replies. He verbalized understanding.

## 2020-06-28 DIAGNOSIS — H53483 Generalized contraction of visual field, bilateral: Secondary | ICD-10-CM | POA: Diagnosis not present

## 2020-06-28 DIAGNOSIS — H57813 Brow ptosis, bilateral: Secondary | ICD-10-CM | POA: Diagnosis not present

## 2020-06-28 DIAGNOSIS — D485 Neoplasm of uncertain behavior of skin: Secondary | ICD-10-CM | POA: Diagnosis not present

## 2020-06-28 DIAGNOSIS — H02834 Dermatochalasis of left upper eyelid: Secondary | ICD-10-CM | POA: Diagnosis not present

## 2020-06-28 DIAGNOSIS — H02413 Mechanical ptosis of bilateral eyelids: Secondary | ICD-10-CM | POA: Diagnosis not present

## 2020-06-28 DIAGNOSIS — H02831 Dermatochalasis of right upper eyelid: Secondary | ICD-10-CM | POA: Diagnosis not present

## 2020-06-28 DIAGNOSIS — H0279 Other degenerative disorders of eyelid and periocular area: Secondary | ICD-10-CM | POA: Diagnosis not present

## 2020-07-07 DIAGNOSIS — Z23 Encounter for immunization: Secondary | ICD-10-CM | POA: Diagnosis not present

## 2020-07-09 ENCOUNTER — Other Ambulatory Visit: Payer: Self-pay

## 2020-07-09 ENCOUNTER — Encounter: Payer: Self-pay | Admitting: Adult Health

## 2020-07-09 ENCOUNTER — Ambulatory Visit: Payer: PPO | Admitting: Adult Health

## 2020-07-09 VITALS — BP 126/68 | HR 61 | Ht 67.0 in | Wt 178.0 lb

## 2020-07-09 DIAGNOSIS — G459 Transient cerebral ischemic attack, unspecified: Secondary | ICD-10-CM

## 2020-07-09 DIAGNOSIS — G3184 Mild cognitive impairment, so stated: Secondary | ICD-10-CM | POA: Diagnosis not present

## 2020-07-09 DIAGNOSIS — Z8673 Personal history of transient ischemic attack (TIA), and cerebral infarction without residual deficits: Secondary | ICD-10-CM

## 2020-07-09 NOTE — Progress Notes (Signed)
I agree with the above plan 

## 2020-07-09 NOTE — Patient Instructions (Signed)
Continue aspirin 81 mg daily  and atorvastatin  for secondary stroke prevention  Continue to follow up with PCP regarding cholesterol, blood pressure and diabetes management  Maintain strict control of hypertension with blood pressure goal below 130/90, diabetes with hemoglobin A1c goal below 7% and cholesterol with LDL cholesterol (bad cholesterol) goal below 70 mg/dL.    Follow up with Dr. Leonie Man in October as scheduled or call earlier if needed     Thank you for coming to see [place] at [facility]. I hope we have been able to provide you high quality care today.  You may receive a patient satisfaction survey over the next few weeks. We would appreciate your feedback and comments so that we may continue to improve ourselves and the health of our patients.

## 2020-07-09 NOTE — Progress Notes (Signed)
[facility] [address] [place]. [place] [address] [phone]       HOSPITAL FOLLOW UP NOTE  Timothy Brewer Date of Birth:  [birth year] Medical Record Number:  [id number]   Reason for Referral:  hospital stroke follow up    SUBJECTIVE:   CHIEF COMPLAINT:  Chief Complaint  Patient presents with  . Follow-up    TR with partner [facility]) Pt is well and stable, no symptoms nor complaints     HPI:   Timothy Brewer is a 80 y.o. male with history of HTN, HLD, DM, former tobacco use, and history of prior stroke who presented on 06/03/2020 sudden onset confusion and speech difficulty lasting 25 to 30 minutes and transient right leg weakness.  Personally reviewed hospitalization pertinent progress notes, lab work and imaging with summary provided.  Evaluated by Dr. Leonie Man and likely posterior circulation TIA vs seizure with postictal confusion.  EEG negative.  Recommended aspirin and Brilinta for 30 days then aspirin alone.  LDL 64 on atorvastatin 20 mg daily.  A1c 6.3 on metformin.  Evaluated by therapies and discharged home in stable condition without therapy needs.  Stroke like episode, possible TIA  with negative stroke work up.    Code Stroke CT head No acute intracranial abnormalities. Chronic small vessel ischemic change and brain atrophy.  MRI   No evidence of acute infarction, hemorrhage, or mass. Mild chronic microvascular ischemic changes  MRA   No evidence of acute infarction, hemorrhage, or mass. Mild chronic microvascular ischemic changes. Normal MRA head.  Carotid Doppler: Right Carotid: Velocities in the right ICA are consistent with a 1-39% stenosis.  Left Carotid: Velocities in the left ICA are consistent with a 1-39% stenosis.  Vertebrals: Bilateral vertebral arteries demonstrate antegrade flow.   2D Echo: done, with read pending  EEG read is normal. No seizure activity.   LDL 64  HgbA1c 6.3  Antithrombotic: Not on  antithrombotic PTA.  Recommended aspirin 81 mg daily and Brilinta for 4 weeks then aspirin alone  Therapy recommendations:  none  Disposition:  Home   Today, 07/09/2020, Timothy Brewer is being seen for hospital follow-up accompanied by his wife, Timothy Brewer  Doing well since discharge without any reoccurring or new stroke/TIA symptoms.  He has returned back to all prior activities without difficulty.  Completed 4 weeks of Brilinta and remains on aspirin 81 mg daily without associated side effects He does report symptoms of shortness of breath while on Brilinta that resolved upon discontinuing Remains on atorvastatin 20 mg daily without associated side effects Blood pressure today 126/68 -routinely monitors at home and typically 120s/60s  Routinely followed by Dr. Leonie Man for MCI with prior visit 12/2019 and scheduled follow-up visit 12/2020  No further concerns at this time   ROS:   14 system review of systems performed and negative with exception of no complaints  PMH:  Past Medical History:  Diagnosis Date  . Arthritis   . Diabetes mellitus without complication ([place])   . Hypertension   . Memory loss   . Pre-diabetes    on metformin  . Stroke [facility])     PSH:  Past Surgical History:  Procedure Laterality Date  . BACK SURGERY     c3,c4,c5,c6 ,l4,l5  . EYE SURGERY     bil cataracts  . NASAL SEPTUM SURGERY    . TONSILLECTOMY    . 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 Marital status: Widowed    Spouse name: Not on file  . Number of children: Not on file  . Years of education: Not on file  . Highest education level: Not on file  Occupational History  . Occupation: retired  Tobacco Use  . Smoking status: Former Smoker    Types: Cigarettes    Quit date:  07/09/2014    Years since quitting: 6.0  . Smokeless tobacco: Never Used  . Tobacco comment: 8-10 a day  Vaping Use  . Vaping Use: Never used  Substance and Sexual Activity  . Alcohol use: Yes    Alcohol/week: 1.0 standard drink    Types: 1 Glasses of wine per week    Comment: socially  . Drug use: No  . Sexual activity: Yes  Other Topics Concern  . Not on file  Social History Narrative   Lives with friend   Right Handed   Drinks 2-3 cups caffeine daily   Social Determinants of Health   Financial Resource Strain: Not on file  Food Insecurity: Not on file  Transportation Needs: Not on file  Physical Activity: Not on file  Stress: Not on file  Social Connections: Not on file  Intimate Partner Violence: Not on file    Family History:  Family History  Problem Relation Age of Onset  . Stroke Maternal Grandmother     Medications:   Current Outpatient Medications on File Prior to Visit  Medication Sig Dispense Refill  . AMOXICILLIN PO Take 500 mg by mouth. For dental work    . Apoaequorin (PREVAGEN) 10 MG CAPS Take by mouth.    Marland Kitchen aspirin EC 81 MG tablet Take 1 tablet (81 mg total) by mouth daily. Swallow whole. 150 tablet 2  . atorvastatin (LIPITOR) 20 MG tablet Take 1 tablet (20 mg total) by mouth daily at 6 PM. 30 tablet 0  . Calcium Carb-Cholecalciferol (CALCIUM 600 + D PO) Take 1 tablet by mouth daily.     . cetirizine (ZYRTEC) 10 MG tablet Take 10 mg by mouth daily.    . ferrous sulfate (FERROUSUL) 325 (65 FE) MG tablet Take 1 tablet (325 mg total) by mouth 3 (three) times daily with meals.  3  . fexofenadine (ALLEGRA) 180 MG tablet Take 180 mg by mouth every evening.     . magnesium oxide (MAG-OX) 400 MG tablet Take 400 mg by mouth daily.    . metFORMIN (GLUCOPHAGE-XR) 500 MG 24 hr tablet Take 1 tablet (500 mg total) by mouth 2 (two) times daily.    . Multiple Vitamin (MULTIVITAMIN ADULT PO) multivitamin    . Multiple Vitamins-Minerals (PRESERVISION AREDS PO) Take by  mouth 2 (two) times daily.    . polyethylene glycol (MIRALAX / GLYCOLAX) packet Take 17 g by mouth 2 (two) times daily. (Patient taking differently: Take 17 g by mouth daily as needed for mild constipation.) 14 each 0  . sildenafil (REVATIO) 20 MG tablet Take 40-100 mg by mouth daily as needed.    . ticagrelor (BRILINTA) 90 MG TABS tablet Take 1 tablet (90 mg total) by mouth 2 (two) times daily. 60 tablet 0  . ticagrelor (BRILINTA) 90 MG TABS tablet TAKE 1 TABLET (90 MG TOTAL) BY MOUTH TWO TIMES DAILY. 60 tablet 0  . valsartan (DIOVAN) 80 MG tablet Take 1 tablet (80 mg total)  by mouth daily.    . vitamin B-12 (CYANOCOBALAMIN) 500 MCG tablet Take 500 mcg by mouth daily.     No current facility-administered medications on file prior to visit.    Allergies:   Allergies  Allergen Reactions  . Iodine Hives and Other (See Comments)    Internal only      OBJECTIVE:  Physical Exam  Vitals:   07/09/20 1310  BP: 126/68  Pulse: 61  Weight: 178 lb (80.7 kg)  Height: 5\' 7"  (1.702 m)   Body mass index is 27.88 kg/m. No exam data present  General: well developed, well nourished, very pleasant elderly Caucasian male, seated, in no evident distress Head: head normocephalic and atraumatic.   Neck: supple with no carotid or supraclavicular bruits Cardiovascular: regular rate and rhythm, no murmurs Musculoskeletal: no deformity Skin:  no rash/petichiae Vascular:  Normal pulses all extremities   Neurologic Exam Mental Status: Awake and fully alert.   Fluent speech and language.  Oriented to place and time. Recent and remote memory intact. Attention span, concentration and fund of knowledge appropriate. Mood and affect appropriate.  Cranial Nerves: Pupils equal, briskly reactive to light. Extraocular movements full without nystagmus. Visual fields full to confrontation. Hearing intact. Facial sensation intact. Face, tongue, palate moves normally and symmetrically.  Motor: Normal bulk and  tone. Normal strength in all tested extremity muscles Sensory.: intact to touch , pinprick , position and vibratory sensation.  Coordination: Rapid alternating movements normal in all extremities. Finger-to-nose and heel-to-shin performed accurately bilaterally. Gait and Station: Arises from chair without difficulty. Stance is normal. Gait demonstrates normal stride length and balance without use of assistive device. Tandem walk and heel toe with mild difficulty.  Reflexes: 1+ and symmetric. Toes downgoing.     NIHSS  0 Modified Rankin  0      ASSESSMENT: Timothy Brewer is a 80 y.o. year old male presented with episode of possible leg weakness and altered cognition on 06/03/2020 possibly in setting of posterior circulation TIA vs seizure with postictal confusion. Vascular risk factors include HTN, HLD, former tobacco use and history of prior stroke.      PLAN:  1. TIA vs seizure : No reoccurring or new symptoms. Continue aspirin 81 mg daily  and atorvastatin 20 mg daily for secondary stroke prevention.  Completed 4 weeks of Brilinta with reported shortness of breath symptoms while taking which resolved shortly after completing.  Discussed secondary stroke prevention measures and importance of close PCP follow up for aggressive stroke risk factor management  2. HTN: BP goal <130/90.  Stable on current regimen per PCP 3. HLD: LDL goal <70. Recent LDL 64 on atorvastatin 20 mg daily per PCP.  4. DM: A1c goal<7.  Recent A1c 6.3 on metformin per PCP 5. MCI: Routinely followed by Dr. Leonie Man with scheduled follow-up visit 12/2020 -cognition stable   Advise follow-up as scheduled in October with Dr. Leonie Man or call earlier if needed   CC:  [place] provider: Dr. Leonie Man PCP: Antony Contras, MD    I spent approximately 43 minutes with face-to-face and non-face-to-face with the patient and wife.  We discussed recent hospitalization possibly in setting of TIA vs seizure type activity, importance of  managing stroke risk factors and recommended goals for stroke prevention, continued use of current medications, hx of MCI and routine follow-up with Dr. Leonie Man and answered all other questions to patient satisfaction  Frann Rider, [facility]  [facility] [address] [place], [place] [address]  Phone [phone] Fax [phone] Note: This document was prepared with digital dictation and possible smart phrase technology. Any transcriptional errors that result from this process are unintentional.

## 2020-07-11 DIAGNOSIS — H53483 Generalized contraction of visual field, bilateral: Secondary | ICD-10-CM | POA: Diagnosis not present

## 2020-08-08 ENCOUNTER — Telehealth: Payer: Self-pay | Admitting: Adult Health

## 2020-08-08 NOTE — Telephone Encounter (Signed)
Jenna @ [employer] left a vm @ 2:13 re: a medical clearance form they faxed over on 05-24 for pt to stop his aspirin & Brilinta because of pt being scheduled for surgery on 06-07.  They have not received the form back yet, please call them at [phone] so they can know when to tell pt to stop taking the above mentioned medications

## 2020-08-09 NOTE — Telephone Encounter (Signed)
Completed and placed on [place], Therapist, sports desk

## 2020-08-09 NOTE — Telephone Encounter (Signed)
I called [employer] and spoke to [place], 05-31-20 she said may have gone to Dr. Leonie Man, I have not gotten for [facility].  She will refax to [phone].  Pt is to have a brow and lid surgery.  I did relay that pt is not on Brilinta only aspirin at this time.  I called patient he stopped Brilinta a week ago.  He is taking 81 mg aspirin daily.  I relayed that for elective surgeries they recommend waiting 3 months from your stroke/TIA June 03, 2020.  I relayed the message to patient from JM/NP below.  He verbalized understanding.

## 2020-08-09 NOTE — Telephone Encounter (Signed)
What is the surgical procedure for?  He had recent TIA on 3/27.  Typically recommend waiting at least 3 months prior to any elective procedure but if this is an emergent procedure where benefit would outweigh risk, he can hold aspirin 3 to 5 days prior with small to moderate risk of recurrent stroke while off therapy.  Will be recommended to restart immediately after.  If able to hold off until July, the risk of possible stroke while off therapy would be less.

## 2020-08-13 NOTE — Telephone Encounter (Signed)
I faxed to [employer] [phone] clearance form.  Sent to medical records.

## 2020-08-23 DIAGNOSIS — I10 Essential (primary) hypertension: Secondary | ICD-10-CM | POA: Diagnosis not present

## 2020-08-23 DIAGNOSIS — M8580 Other specified disorders of bone density and structure, unspecified site: Secondary | ICD-10-CM | POA: Diagnosis not present

## 2020-08-23 DIAGNOSIS — E782 Mixed hyperlipidemia: Secondary | ICD-10-CM | POA: Diagnosis not present

## 2020-08-23 DIAGNOSIS — E119 Type 2 diabetes mellitus without complications: Secondary | ICD-10-CM | POA: Diagnosis not present

## 2020-08-23 DIAGNOSIS — E1169 Type 2 diabetes mellitus with other specified complication: Secondary | ICD-10-CM | POA: Diagnosis not present

## 2020-08-23 DIAGNOSIS — J439 Emphysema, unspecified: Secondary | ICD-10-CM | POA: Diagnosis not present

## 2020-09-11 ENCOUNTER — Other Ambulatory Visit: Payer: Self-pay

## 2020-09-11 ENCOUNTER — Emergency Department (HOSPITAL_COMMUNITY): Payer: PPO

## 2020-09-11 ENCOUNTER — Observation Stay (HOSPITAL_COMMUNITY)
Admission: EM | Admit: 2020-09-11 | Discharge: 2020-09-12 | Disposition: A | Discharge status: Home / Self Care | Payer: PPO | Attending: Student | Admitting: Student

## 2020-09-11 ENCOUNTER — Encounter (HOSPITAL_COMMUNITY): Payer: Self-pay | Admitting: Emergency Medicine

## 2020-09-11 DIAGNOSIS — I5032 Chronic diastolic (congestive) heart failure: Secondary | ICD-10-CM | POA: Diagnosis present

## 2020-09-11 DIAGNOSIS — R262 Difficulty in walking, not elsewhere classified: Secondary | ICD-10-CM | POA: Diagnosis not present

## 2020-09-11 DIAGNOSIS — I619 Nontraumatic intracerebral hemorrhage, unspecified: Secondary | ICD-10-CM | POA: Diagnosis not present

## 2020-09-11 DIAGNOSIS — G459 Transient cerebral ischemic attack, unspecified: Principal | ICD-10-CM | POA: Insufficient documentation

## 2020-09-11 DIAGNOSIS — Z87891 Personal history of nicotine dependence: Secondary | ICD-10-CM | POA: Diagnosis not present

## 2020-09-11 DIAGNOSIS — I672 Cerebral atherosclerosis: Secondary | ICD-10-CM | POA: Diagnosis not present

## 2020-09-11 DIAGNOSIS — E1169 Type 2 diabetes mellitus with other specified complication: Secondary | ICD-10-CM | POA: Diagnosis present

## 2020-09-11 DIAGNOSIS — E119 Type 2 diabetes mellitus without complications: Secondary | ICD-10-CM | POA: Insufficient documentation

## 2020-09-11 DIAGNOSIS — R200 Anesthesia of skin: Secondary | ICD-10-CM | POA: Diagnosis present

## 2020-09-11 DIAGNOSIS — E1165 Type 2 diabetes mellitus with hyperglycemia: Secondary | ICD-10-CM | POA: Diagnosis present

## 2020-09-11 DIAGNOSIS — Z20822 Contact with and (suspected) exposure to covid-19: Secondary | ICD-10-CM | POA: Diagnosis not present

## 2020-09-11 DIAGNOSIS — I10 Essential (primary) hypertension: Secondary | ICD-10-CM | POA: Diagnosis not present

## 2020-09-11 DIAGNOSIS — Z96642 Presence of left artificial hip joint: Secondary | ICD-10-CM | POA: Insufficient documentation

## 2020-09-11 DIAGNOSIS — Z8673 Personal history of transient ischemic attack (TIA), and cerebral infarction without residual deficits: Secondary | ICD-10-CM | POA: Diagnosis not present

## 2020-09-11 DIAGNOSIS — E875 Hyperkalemia: Secondary | ICD-10-CM | POA: Diagnosis present

## 2020-09-11 DIAGNOSIS — R000 Tachycardia, unspecified: Secondary | ICD-10-CM | POA: Diagnosis not present

## 2020-09-11 DIAGNOSIS — K219 Gastro-esophageal reflux disease without esophagitis: Secondary | ICD-10-CM | POA: Diagnosis present

## 2020-09-11 DIAGNOSIS — Z79899 Other long term (current) drug therapy: Secondary | ICD-10-CM | POA: Insufficient documentation

## 2020-09-11 DIAGNOSIS — I152 Hypertension secondary to endocrine disorders: Secondary | ICD-10-CM | POA: Diagnosis present

## 2020-09-11 DIAGNOSIS — Z7984 Long term (current) use of oral hypoglycemic drugs: Secondary | ICD-10-CM | POA: Insufficient documentation

## 2020-09-11 DIAGNOSIS — R001 Bradycardia, unspecified: Secondary | ICD-10-CM | POA: Diagnosis not present

## 2020-09-11 DIAGNOSIS — I639 Cerebral infarction, unspecified: Secondary | ICD-10-CM

## 2020-09-11 DIAGNOSIS — Z7982 Long term (current) use of aspirin: Secondary | ICD-10-CM | POA: Diagnosis not present

## 2020-09-11 LAB — CBC
HCT: 39.8 % (ref 39.0–52.0)
Hemoglobin: 13.2 g/dL (ref 13.0–17.0)
MCH: 31.7 pg (ref 26.0–34.0)
MCHC: 33.2 g/dL (ref 30.0–36.0)
MCV: 95.7 fL (ref 80.0–100.0)
Platelets: 263 10*3/uL (ref 150–400)
RBC: 4.16 MIL/uL — ABNORMAL LOW (ref 4.22–5.81)
RDW: 14.2 % (ref 11.5–15.5)
WBC: 6.1 10*3/uL (ref 4.0–10.5)
nRBC: 0 % (ref 0.0–0.2)

## 2020-09-11 LAB — I-STAT CHEM 8, ED
BUN: 18 mg/dL (ref 8–23)
Calcium, Ion: 1.1 mmol/L — ABNORMAL LOW (ref 1.15–1.40)
Chloride: 107 mmol/L (ref 98–111)
Creatinine, Ser: 1.1 mg/dL (ref 0.61–1.24)
Glucose, Bld: 177 mg/dL — ABNORMAL HIGH (ref 70–99)
HCT: 39 % (ref 39.0–52.0)
Hemoglobin: 13.3 g/dL (ref 13.0–17.0)
Potassium: 3.9 mmol/L (ref 3.5–5.1)
Sodium: 137 mmol/L (ref 135–145)
TCO2: 20 mmol/L — ABNORMAL LOW (ref 22–32)

## 2020-09-11 LAB — BASIC METABOLIC PANEL
Anion gap: 10 (ref 5–15)
BUN: 18 mg/dL (ref 8–23)
CO2: 22 mmol/L (ref 22–32)
Calcium: 8.9 mg/dL (ref 8.9–10.3)
Chloride: 102 mmol/L (ref 98–111)
Creatinine, Ser: 1.13 mg/dL (ref 0.61–1.24)
GFR, Estimated: >60 mL/min (ref >60)
Glucose, Bld: 99 mg/dL (ref 70–99)
Potassium: 5.7 mmol/L — ABNORMAL HIGH (ref 3.5–5.1)
Sodium: 134 mmol/L — ABNORMAL LOW (ref 135–145)

## 2020-09-11 LAB — COMPREHENSIVE METABOLIC PANEL
ALT: 20 U/L (ref 0–44)
AST: 37 U/L (ref 15–41)
Albumin: 3.6 g/dL (ref 3.5–5.0)
Alkaline Phosphatase: 44 U/L (ref 38–126)
Anion gap: 7 (ref 5–15)
BUN: 18 mg/dL (ref 8–23)
CO2: 22 mmol/L (ref 22–32)
Calcium: 8.8 mg/dL — ABNORMAL LOW (ref 8.9–10.3)
Chloride: 105 mmol/L (ref 98–111)
Creatinine, Ser: 1.13 mg/dL (ref 0.61–1.24)
GFR, Estimated: >60 mL/min (ref >60)
Glucose, Bld: 102 mg/dL — ABNORMAL HIGH (ref 70–99)
Potassium: 5.8 mmol/L — ABNORMAL HIGH (ref 3.5–5.1)
Sodium: 134 mmol/L — ABNORMAL LOW (ref 135–145)
Total Bilirubin: 0.1 mg/dL — ABNORMAL LOW (ref 0.3–1.2)
Total Protein: 6.2 g/dL — ABNORMAL LOW (ref 6.5–8.1)

## 2020-09-11 LAB — URINALYSIS, ROUTINE W REFLEX MICROSCOPIC
Bilirubin Urine: NEGATIVE
Glucose, UA: NEGATIVE mg/dL
Hgb urine dipstick: NEGATIVE
Ketones, ur: NEGATIVE mg/dL
Leukocytes,Ua: NEGATIVE
Nitrite: NEGATIVE
Protein, ur: NEGATIVE mg/dL
Specific Gravity, Urine: 1.011 (ref 1.005–1.030)
pH: 5 (ref 5.0–8.0)

## 2020-09-11 LAB — DIFFERENTIAL
Abs Immature Granulocytes: 0.01 10*3/uL (ref 0.00–0.07)
Basophils Absolute: 0.1 10*3/uL (ref 0.0–0.1)
Basophils Relative: 1 %
Eosinophils Absolute: 0.3 10*3/uL (ref 0.0–0.5)
Eosinophils Relative: 5 %
Immature Granulocytes: 0 %
Lymphocytes Relative: 22 %
Lymphs Abs: 1.3 10*3/uL (ref 0.7–4.0)
Monocytes Absolute: 0.8 10*3/uL (ref 0.1–1.0)
Monocytes Relative: 13 %
Neutro Abs: 3.6 10*3/uL (ref 1.7–7.7)
Neutrophils Relative %: 59 %

## 2020-09-11 LAB — PROTIME-INR
INR: 0.9 (ref 0.8–1.2)
Prothrombin Time: 12.4 seconds (ref 11.4–15.2)

## 2020-09-11 LAB — APTT: aPTT: 27 seconds (ref 24–36)

## 2020-09-11 NOTE — ED Notes (Signed)
PT back from MRI. 

## 2020-09-11 NOTE — ED Triage Notes (Signed)
Patient BIB [facility] from home. Complaint of sneezing 5 times causing sudden onset of left sided facial numbness. Symptoms have resolved. Hx TIA. VSS.  16 LFA 18 RAC

## 2020-09-11 NOTE — Consult Note (Signed)
NEUROLOGY CONSULTATION NOTE   Date of service: September 11, 2020 Patient Name: Timothy Brewer MRN:  [id number] DOB:  01-16-1941 Reason for consult: "Left facial numbness and L arm numbness" Requesting Provider: Lacretia Leigh, MD _ _ _   _ __   _ __ _ _  __ __   _ __   __ _  History of Present Illness  Timothy Brewer is a 80 y.o. male with PMH significant for DM2, HTN, prior TIA who had a bout of sneezing followed by vertigo which resolved in a couple mins. Reports that immediately afterwards, he had a 20 mins episode of L facial numbness including the jaw and the inside of his mouth and left hand numbness. He immediately called EMS and the symptoms resolved just prior to coming to the ED. Last about 20 mins. No headaches. Takes aspirin 81mg  daily at home.  mRS: 0 tPA/thrombectomy: not offered, symptoms resolved. NIHSS components Score: Comment  1a Level of Conscious 0[x]  1[]  2[]  3[]      1b LOC Questions 0[x]  1[]  2[]       1c LOC Commands 0[x]  1[]  2[]       2 Best Gaze 0[x]  1[]  2[]       3 Visual 0[x]  1[]  2[]  3[]      4 Facial Palsy 0[x]  1[]  2[]  3[]      5a Motor Arm - left 0[x]  1[]  2[]  3[]  4[]  UN[]    5b Motor Arm - Right 0[x]  1[]  2[]  3[]  4[]  UN[]    6a Motor Leg - Left 0[x]  1[]  2[]  3[]  4[]  UN[]    6b Motor Leg - Right 0[x]  1[]  2[]  3[]  4[]  UN[]    7 Limb Ataxia 0[x]  1[]  2[]  3[]  UN[]     8 Sensory 0[x]  1[]  2[]  UN[]      9 Best Language 0[x]  1[]  2[]  3[]      10 Dysarthria 0[x]  1[]  2[]  UN[]      11 Extinct. and Inattention 0[x]  1[]  2[]       TOTAL: 0       ROS   Constitutional Denies weight loss, fever and chills.   HEENT Denies changes in vision and hearing.   Respiratory Denies SOB and cough.   CV Denies palpitations and CP   GI Denies abdominal pain, nausea, vomiting and diarrhea.   GU Denies dysuria and urinary frequency.   MSK Denies myalgia and joint pain.   Skin Denies rash and pruritus.   Neurological Denies headache and syncope.   Psychiatric Denies recent changes in mood. Denies  anxiety and depression.    Past History   Past Medical History:  Diagnosis Date   Arthritis    Diabetes mellitus without complication ([place])    Hypertension    Memory loss    Pre-diabetes    on metformin   Stroke 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   Procedure: LEFT TOTAL HIP ARTHROPLASTY ANTERIOR APPROACH;  Surgeon: Paralee Cancel, MD;  Location: [facility];  Service: Orthopedics;  Laterality: Left;  70 mins   Family History  Problem Relation Age of Onset   Stroke Maternal Grandmother    Social History   Socioeconomic History   Marital status: Widowed    Spouse name: Not on file   Number of children: Not on file  Years of education: Not on file   Highest education level: Not on file  Occupational History   Occupation: retired  Tobacco Use   Smoking status: Former    Pack years: 0.00    Types: Cigarettes    Quit date: 07/09/2014    Years since quitting: 6.1   Smokeless tobacco: Never   Tobacco comments:    8-10 a day  Vaping Use   Vaping Use: Never used  Substance and Sexual Activity   Alcohol use: Yes    Alcohol/week: 1.0 standard drink    Types: 1 Glasses of wine per week    Comment: socially   Drug use: No   Sexual activity: Yes  Other Topics Concern   Not on file  Social History Narrative   Lives with friend   Right Handed   Drinks 2-3 cups caffeine daily   Social Determinants of Health   Financial Resource Strain: Not on file  Food Insecurity: Not on file  Transportation Needs: Not on file  Physical Activity: Not on file  Stress: Not on file  Social Connections: Not on file   Allergies  Allergen Reactions   Brilinta [Ticagrelor] Shortness Of Breath   Iodine Hives and Other (See Comments)    Internal only    Medications  (Not in a hospital admission)    Vitals    Vitals:   09/11/20 2000 09/11/20 2015 09/11/20 2030 09/11/20 2122  BP: (!) 152/70 (!) 156/74 (!) 160/86 (!) 158/76  Pulse: 61 64 67 72  Resp: 17 17 20 18   Temp:      TempSrc:      SpO2: 97% 98% 98% 97%     There is no height or weight on file to calculate BMI.  Physical Exam   General: Laying comfortably in bed; in no acute distress.  HENT: Normal oropharynx and mucosa. Normal external appearance of ears and nose. Neck: Supple, no pain or tenderness CV: No JVD. No peripheral edema. Pulmonary: Symmetric Chest rise. Normal respiratory effort.  Abdomen: Soft to touch, non-tender.  Ext: No cyanosis, edema, or deformity  Skin: No rash. Normal palpation of skin.   Musculoskeletal: Normal digits and nails by inspection. No clubbing.   Neurologic Examination  Mental status/Cognition: Alert, oriented to self, place, month and year, good attention.  Speech/language: Fluent, comprehension intact, object naming intact, repetition intact.  Cranial nerves:   CN II Pupils equal and reactive to light, no VF deficits    CN III,IV,VI EOM intact, no gaze preference or deviation, no nystagmus    CN V normal sensation in V1, V2, and V3 segments bilaterally    CN VII no asymmetry, no nasolabial fold flattening    CN VIII normal hearing to speech   CN IX & X normal palatal elevation, no uvular deviation    CN XI 5/5 head turn and 5/5 shoulder shrug bilaterally    CN XII midline tongue protrusion    Motor:  Muscle bulk: normal, tone normal, pronator drift none tremor none Mvmt Root Nerve  Muscle Right Left Comments  SA C5/6 Ax Deltoid 5 5   EF C5/6 Mc Biceps 5 5   EE C6/7/8 Rad Triceps 5 5   WF C6/7 Med FCR     WE C7/8 PIN ECU     F Ab C8/T1 U ADM/FDI 5 5   HF L1/2/3 Fem Illopsoas 5 5   KE L2/3/4 Fem Quad 5 5   DF L4/5 D Peron Tib Ant 5 5  PF S1/2 Tibial Grc/Sol 5 5    Reflexes:  Right Left Comments  Pectoralis      Biceps (C5/6) 2 2   Brachioradialis (C5/6) 2 2    Triceps (C6/7)  2 2    Patellar (L3/4) 2 2    Achilles (S1)      Hoffman      Plantar     Jaw jerk    Sensation:  Light touch intact   Pin prick    Temperature    Vibration   Proprioception    Coordination/Complex Motor:  - Finger to Nose intact BL - Heel to shin intact BL - Rapid alternating movement are normal - Gait: Deferred.  Labs   CBC:  Recent Labs  Lab 09/11/20 1851  WBC 6.1  NEUTROABS 3.6  HGB 13.2  HCT 39.8  MCV 95.7  PLT 366    Basic Metabolic Panel:  Lab Results  Component Value Date   NA 134 (L) 09/11/2020   K 5.7 (H) 09/11/2020   CO2 22 09/11/2020   GLUCOSE 99 09/11/2020   BUN 18 09/11/2020   CREATININE 1.13 09/11/2020   CALCIUM 8.9 09/11/2020   GFRNONAA >60 09/11/2020   GFRAA >60 07/10/2017   Lipid Panel:  Lab Results  Component Value Date   LDLCALC 64 06/04/2020   HgbA1c:  Lab Results  Component Value Date   HGBA1C 6.3 (H) 06/04/2020   Urine Drug Screen:     Component Value Date/Time   LABOPIA NONE DETECTED 06/07/2017 1137   COCAINSCRNUR NONE DETECTED 06/07/2017 1137   LABBENZ NONE DETECTED 06/07/2017 1137   AMPHETMU NONE DETECTED 06/07/2017 1137   THCU NONE DETECTED 06/07/2017 1137   LABBARB NONE DETECTED 06/07/2017 1137    Alcohol Level     Component Value Date/Time   ETH <10 06/07/2017 0938    CT Head without contrast: CTH was negative for a large hypodensity concerning for a large territory infarct or hyperdensity concerning for an ICH  MR Angio head without contrast and Carotid Duplex BL: pending  MRI Brain: No acute abnormalities Impression   Timothy Brewer is a 80 y.o. male with PMH significant for DM2, HTN, prior TIA who had a bout of sneezing followed by vertigo which resolved in a couple mins. Reports that immediately afterwards, he had a 20 mins episode of L facial numbness including the jaw and the inside of his mouth and left hand numbness. Symptoms spotaneously resolved. His neurologic examination is notable for no  focal deficit.  The facial numbness might be explained by pressure injury to the facial nerve in the middle ear, so can the dizziness which can result from rapid movement of the head in a bout of sneeze. However, they do not explain the numbness of his hand and the duration of the episode lasting about 20 mins. This may potentially be a TIA.  Probable TIA.  Recommendations  Plan:  - Frequent Neuro checks per stroke unit protocol - Recommend Vascular imaging with MRA Angio Head without contrast and US Carotid doppler.(Allergic to iodine) - Recent TTE on 06/04/20. However no report. If the TTE was normal in March 2022, no need to repeat it. - Recommend obtaining Lipid panel with LDL - Please start statin if LDL > 70 - Recommend HbA1c - Antithrombotic - Aspirin 81mg  daily, reports allergy to plavix and SOB to brilinta. - Recommend DVT ppx - SBP goal - permissive hypertension first 24 h < 220/110. Held home meds.  - Recommend Telemetry monitoring  for arrythmia - Recommend bedside swallow screen prior to PO intake. - Stroke education booklet - Recommend PT/OT/SLP consult  ______________________________________________________________________   Thank you for the opportunity to take part in the care of this patient. If you have any further questions, please contact the neurology consultation attending.  Signed,  [place] Pager Number [phone] _ _ _   _ __   _ __ _ _  __ __   _ __   __ _

## 2020-09-11 NOTE — ED Notes (Signed)
PT going to MRI

## 2020-09-11 NOTE — ED Provider Notes (Signed)
[facility]ERGENCY DEPARTMENT Provider Note   CSN: [id number] Arrival date & time: 09/11/20  1731     History Chief Complaint  Patient presents with   Transient Ischemic Attack    Timothy Brewer is a 80 y.o. male with past medical history of diabetes, hypertension, stroke that presents to the emergency department today for left-sided facial numbness and arm numbness and abnormal gait.  Patient states that he was sitting, states that he started sneezing and then a couple minutes later he had sudden onset of left-sided facial numbness around his mouth that radiated up to his left eye.  States that it also radiated down to his left arm.  Denies any chest pain or shortness of breath.  States that he stood to walk and he had an abnormal gait, stating that he was drifting towards the left every time he tried to walk.  States that he was extremely dizzy from this.  States that this lasted about 20 or more minutes, was still present when EMS arrived.  Patient states that symptoms have completely resolved now.  Denies any vision changes.  Denies any facial droop or speech abnormality.  Denies any aphasia.  When I asked him if he had left arm weakness he states that he felt as if his left arm was very heavy.  Denies any right extremity abnormality.  Denies any abdominal pain nausea vomiting.  Per chart review patient had a TIA in March 28 of this year, had full TIA work-up at that time.  Patient is on aspirin.  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   Plantar fasciitis  06/09/2016    Past Surgical History:  Procedure Laterality Date   BACK SURGERY     c3,c4,c5,c6 ,l4,l5   EYE SURGERY     bil cataracts   NASAL SEPTUM SURGERY     TONSILLECTOMY     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   Procedure: LEFT TOTAL HIP ARTHROPLASTY ANTERIOR APPROACH;  Surgeon: Paralee Cancel, MD;  Location: [facility];  Service: Orthopedics;  Laterality: Left;  70 mins       Family History  Problem Relation Age of Onset   Stroke Maternal Grandmother     Social History   Tobacco Use   Smoking status: Former    Pack years: 0.00    Types: Cigarettes    Quit date: 07/09/2014    Years since quitting: 6.1   Smokeless tobacco: Never   Tobacco comments:    8-10 a day  Vaping Use   Vaping Use: Never used  Substance Use Topics   Alcohol use: Yes    Alcohol/week: 1.0 standard drink    Types: 1 Glasses of wine per week    Comment: socially   Drug use: No    Home Medications Prior to Admission medications   Medication Sig Start Date End Date Taking? Authorizing Provider  AMOXICILLIN PO Take 500 mg by mouth. For dental work    Secondary school teacher, Historical,  MD  Apoaequorin (PREVAGEN) 10 MG CAPS Take by mouth.    [provider]  aspirin EC 81 MG tablet Take 1 tablet (81 mg total) by mouth daily. Swallow whole. 06/04/20 06/04/21  Geradine Girt, DO  atorvastatin (LIPITOR) 20 MG tablet Take 1 tablet (20 mg total) by mouth daily at 6 PM. 06/08/17   Regalado, Belkys A, MD  Calcium Carb-Cholecalciferol (CALCIUM 600 + D PO) Take 1 tablet by mouth daily.     [provider]  cetirizine (ZYRTEC) 10 MG tablet Take 10 mg by mouth daily.    [provider]  ferrous sulfate (FERROUSUL) 325 (65 FE) MG tablet Take 1 tablet (325 mg total) by mouth 3 (three) times daily with meals. 02/17/17   Danae Orleans, PA-C  fexofenadine (ALLEGRA) 180 MG tablet Take 180 mg by mouth every evening.     [provider]  magnesium oxide (MAG-OX) 400 MG tablet Take 400 mg by mouth  daily.    [provider]  metFORMIN (GLUCOPHAGE-XR) 500 MG 24 hr tablet Take 1 tablet (500 mg total) by mouth 2 (two) times daily. 06/05/20   Geradine Girt, DO  Multiple Vitamin (MULTIVITAMIN ADULT PO) multivitamin    [provider]  Multiple Vitamins-Minerals (PRESERVISION AREDS PO) Take by mouth 2 (two) times daily.    [provider]  polyethylene glycol (MIRALAX / GLYCOLAX) packet Take 17 g by mouth 2 (two) times daily. Patient taking differently: Take 17 g by mouth daily as needed for mild constipation. 02/17/17   Danae Orleans, PA-C  sildenafil (REVATIO) 20 MG tablet Take 40-100 mg by mouth daily as needed. 01/19/19   [provider]  valsartan (DIOVAN) 80 MG tablet Take 1 tablet (80 mg total) by mouth daily. 06/06/20   Geradine Girt, DO  vitamin B-12 (CYANOCOBALAMIN) 500 MCG tablet Take 500 mcg by mouth daily.    [provider]    Allergies    Brilinta [ticagrelor] and Iodine  Review of Systems   Review of Systems  Constitutional:  Negative for chills, diaphoresis, fatigue and fever.  HENT:  Negative for congestion, sore throat and trouble swallowing.   Eyes:  Negative for pain and visual disturbance.  Respiratory:  Negative for cough, shortness of breath and wheezing.   Cardiovascular:  Negative for chest pain, palpitations and leg swelling.  Gastrointestinal:  Negative for abdominal distention, abdominal pain, diarrhea, nausea and vomiting.  Genitourinary:  Negative for difficulty urinating.  Musculoskeletal:  Negative for back pain, neck pain and neck stiffness.  Skin:  Negative for pallor.  Neurological:  Positive for numbness. Negative for dizziness, speech difficulty, weakness and headaches.  Psychiatric/Behavioral:  Negative for confusion.    Physical Exam Updated Vital Signs BP (!) 166/83 (BP Location: Right Arm)   Pulse 79   Temp 98.3 F (36.8 C) (Oral)   Resp (!) 22   SpO2 97%   Physical Exam Constitutional:       General: He is not in acute distress.    Appearance: Normal appearance. He is not ill-appearing, toxic-appearing or diaphoretic.  HENT:     Head:     Comments: Normal facial sensation.     Mouth/Throat:     Mouth: Mucous membranes are moist.     Pharynx: Oropharynx is clear.  Eyes:     General: No scleral icterus.    Extraocular Movements: Extraocular movements intact.     Pupils: Pupils are equal, round, and reactive to light.  Cardiovascular:  Rate and Rhythm: Normal rate and regular rhythm.     Pulses: Normal pulses.     Heart sounds: Normal heart sounds.  Pulmonary:     Effort: Pulmonary effort is normal. No respiratory distress.     Breath sounds: Normal breath sounds. No stridor. No wheezing, rhonchi or rales.  Chest:     Chest wall: No tenderness.  Abdominal:     General: Abdomen is flat. There is no distension.     Palpations: Abdomen is soft.     Tenderness: There is no abdominal tenderness. There is no guarding or rebound.  Musculoskeletal:        General: No swelling or tenderness. Normal range of motion.     Cervical back: Normal range of motion and neck supple. No rigidity.     Right lower leg: No edema.     Left lower leg: No edema.  Skin:    General: Skin is warm and dry.     Capillary Refill: Capillary refill takes less than 2 seconds.     Coloration: Skin is not pale.  Neurological:     General: No focal deficit present.     Mental Status: He is alert and oriented to person, place, and time.     Comments: Alert. Clear speech. No facial droop. CNIII-XII grossly intact. Bilateral upper and lower extremities' sensation grossly intact. 5/5 symmetric strength with grip strength and with plantar and dorsi flexion bilaterally. Patellar DTRs are 2+ and symmetric . Normal finger to nose bilaterally. Negative pronator drift. Negative Romberg sign. Gait is steady and intact.     Psychiatric:        Mood and Affect: Mood normal.        Behavior: Behavior normal.     ED Results / Procedures / Treatments   Labs (all labs ordered are listed, but only abnormal results are displayed) Labs Reviewed  CBC - Abnormal; Notable for the following components:      Result Value   RBC 4.16 (*)    All other components within normal limits  COMPREHENSIVE METABOLIC PANEL - Abnormal; Notable for the following components:   Sodium 134 (*)    Potassium 5.8 (*)    Glucose, Bld 102 (*)    Calcium 8.8 (*)    Total Protein 6.2 (*)    Total Bilirubin 0.1 (*)    All other components within normal limits  BASIC METABOLIC PANEL - Abnormal; Notable for the following components:   Sodium 134 (*)    Potassium 5.7 (*)    All other components within normal limits  I-STAT CHEM 8, ED - Abnormal; Notable for the following components:   Glucose, Bld 177 (*)    Calcium, Ion 1.10 (*)    TCO2 20 (*)    All other components within normal limits  PROTIME-INR  APTT  DIFFERENTIAL  URINALYSIS, ROUTINE W REFLEX MICROSCOPIC    EKG None  Radiology CT HEAD WO CONTRAST  Result Date: 09/11/2020 CLINICAL DATA:  TIA EXAM: CT HEAD WITHOUT CONTRAST TECHNIQUE: Contiguous axial images were obtained from the base of the skull through the vertex without intravenous contrast. COMPARISON:  MR brain dated 06/03/2020 FINDINGS: Brain: No evidence of acute infarction, hemorrhage, hydrocephalus, extra-axial collection or mass lesion/mass effect. Subcortical white matter and periventricular small vessel ischemic changes. Vascular: Mild intracranial atherosclerosis. Skull: Normal. Negative for fracture or focal lesion. Sinuses/Orbits: Mucosal thickening in the left maxillary sinus. Visualized paranasal sinuses and mastoid air cells are clear. Other: None. IMPRESSION: No  evidence of acute intracranial abnormality. Small vessel ischemic changes. Electronically Signed   By: Julian Hy M.D.   On: 09/11/2020 19:19   MR Brain Wo Contrast (neuro protocol)  Result Date: 09/11/2020 CLINICAL DATA:   Transient ischemic attack EXAM: MRI HEAD WITHOUT CONTRAST TECHNIQUE: Multiplanar, multiecho pulse sequences of the brain and surrounding structures were obtained without intravenous contrast. COMPARISON:  06/03/2020 FINDINGS: Brain: No acute infarct, mass effect or extra-axial collection. No acute or chronic hemorrhage. There is multifocal hyperintense T2-weighted signal within the white matter. Generalized volume loss without a clear lobar predilection. The midline structures are normal. Vascular: Major flow voids are preserved. Skull and upper cervical spine: Normal calvarium and skull base. Visualized upper cervical spine and soft tissues are normal. Sinuses/Orbits:No paranasal sinus fluid levels or advanced mucosal thickening. No mastoid or middle ear effusion. Normal orbits. IMPRESSION: 1. No acute intracranial abnormality. 2. 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 Electronically Signed   By: Ulyses Jarred M.D.   On: 09/11/2020 22:52    Procedures Procedures   Medications Ordered in ED Medications - No data to display  ED Course  I have reviewed the triage vital signs and the nursing notes.  Pertinent labs & imaging results that were available during my care of the patient were reviewed by me and considered in my medical decision making (see chart for details).    MDM Rules/Calculators/A&P                         80 year old male presenting to the emerge department with TIA.  Symptoms have completely resolved.  Patient recently admitted this year for full TIA work-up, MRI negative at that time.  Patient is on aspirin.  Patient has normal neuro exam here today with normal gait, will obtain basic labs and CT head imaging at this time. K hemolyzed, repeat normal.   CT and MRI without acute evidence of stroke.  Consulted neurology.   Dr. Tarri Fuller, neurology recommending patient to be admitted for TIA work-up at this time.  Patient admitted to Dr. Cyd Silence.    The patient appears reasonably stabilized for admission considering the current resources, flow, and capabilities available in the ED at this time, and I doubt any other [facility] requiring further screening and/or treatment in the ED prior to admission.   Final Clinical Impression(s) / ED Diagnoses Final diagnoses:  TIA (transient ischemic attack)    Rx / DC Orders ED Discharge Orders     None        Alfredia Client, PA-C 09/12/20 0059    Lacretia Leigh, MD 09/12/20 1615

## 2020-09-12 ENCOUNTER — Observation Stay (HOSPITAL_BASED_OUTPATIENT_CLINIC_OR_DEPARTMENT_OTHER): Payer: PPO

## 2020-09-12 ENCOUNTER — Encounter (HOSPITAL_COMMUNITY): Payer: Self-pay | Admitting: Internal Medicine

## 2020-09-12 ENCOUNTER — Observation Stay (HOSPITAL_COMMUNITY): Payer: PPO

## 2020-09-12 DIAGNOSIS — I5032 Chronic diastolic (congestive) heart failure: Secondary | ICD-10-CM

## 2020-09-12 DIAGNOSIS — E875 Hyperkalemia: Secondary | ICD-10-CM | POA: Diagnosis not present

## 2020-09-12 DIAGNOSIS — I10 Essential (primary) hypertension: Secondary | ICD-10-CM

## 2020-09-12 DIAGNOSIS — E1169 Type 2 diabetes mellitus with other specified complication: Secondary | ICD-10-CM | POA: Diagnosis not present

## 2020-09-12 DIAGNOSIS — G459 Transient cerebral ischemic attack, unspecified: Secondary | ICD-10-CM

## 2020-09-12 DIAGNOSIS — E1165 Type 2 diabetes mellitus with hyperglycemia: Secondary | ICD-10-CM | POA: Diagnosis not present

## 2020-09-12 DIAGNOSIS — I639 Cerebral infarction, unspecified: Secondary | ICD-10-CM

## 2020-09-12 DIAGNOSIS — E782 Mixed hyperlipidemia: Secondary | ICD-10-CM

## 2020-09-12 DIAGNOSIS — K219 Gastro-esophageal reflux disease without esophagitis: Secondary | ICD-10-CM

## 2020-09-12 DIAGNOSIS — I152 Hypertension secondary to endocrine disorders: Secondary | ICD-10-CM | POA: Diagnosis present

## 2020-09-12 DIAGNOSIS — E1159 Type 2 diabetes mellitus with other circulatory complications: Secondary | ICD-10-CM | POA: Diagnosis present

## 2020-09-12 LAB — SARS CORONAVIRUS 2 (TAT 6-24 HRS): SARS Coronavirus 2: NEGATIVE

## 2020-09-12 LAB — CBC WITH DIFFERENTIAL/PLATELET
Abs Immature Granulocytes: 0.01 10*3/uL (ref 0.00–0.07)
Basophils Absolute: 0 10*3/uL (ref 0.0–0.1)
Basophils Relative: 1 %
Eosinophils Absolute: 0.4 10*3/uL (ref 0.0–0.5)
Eosinophils Relative: 7 %
HCT: 40.1 % (ref 39.0–52.0)
Hemoglobin: 13.3 g/dL (ref 13.0–17.0)
Immature Granulocytes: 0 %
Lymphocytes Relative: 21 %
Lymphs Abs: 1.4 10*3/uL (ref 0.7–4.0)
MCH: 32.2 pg (ref 26.0–34.0)
MCHC: 33.2 g/dL (ref 30.0–36.0)
MCV: 97.1 fL (ref 80.0–100.0)
Monocytes Absolute: 0.7 10*3/uL (ref 0.1–1.0)
Monocytes Relative: 12 %
Neutro Abs: 3.8 10*3/uL (ref 1.7–7.7)
Neutrophils Relative %: 59 %
Platelets: 236 10*3/uL (ref 150–400)
RBC: 4.13 MIL/uL — ABNORMAL LOW (ref 4.22–5.81)
RDW: 14.2 % (ref 11.5–15.5)
WBC: 6.4 10*3/uL (ref 4.0–10.5)
nRBC: 0 % (ref 0.0–0.2)

## 2020-09-12 LAB — COMPREHENSIVE METABOLIC PANEL
ALT: 15 U/L (ref 0–44)
AST: 28 U/L (ref 15–41)
Albumin: 3.4 g/dL — ABNORMAL LOW (ref 3.5–5.0)
Alkaline Phosphatase: 40 U/L (ref 38–126)
Anion gap: 7 (ref 5–15)
BUN: 16 mg/dL (ref 8–23)
CO2: 22 mmol/L (ref 22–32)
Calcium: 8.6 mg/dL — ABNORMAL LOW (ref 8.9–10.3)
Chloride: 108 mmol/L (ref 98–111)
Creatinine, Ser: 1.16 mg/dL (ref 0.61–1.24)
GFR, Estimated: >60 mL/min (ref >60)
Glucose, Bld: 201 mg/dL — ABNORMAL HIGH (ref 70–99)
Potassium: 4.9 mmol/L (ref 3.5–5.1)
Sodium: 137 mmol/L (ref 135–145)
Total Bilirubin: 0.8 mg/dL (ref 0.3–1.2)
Total Protein: 5.8 g/dL — ABNORMAL LOW (ref 6.5–8.1)

## 2020-09-12 LAB — MAGNESIUM: Magnesium: 2 mg/dL (ref 1.7–2.4)

## 2020-09-12 LAB — LIPID PANEL
Cholesterol: 163 mg/dL (ref 0–200)
HDL: 74 mg/dL (ref >40)
LDL Cholesterol: 72 mg/dL (ref 0–99)
Total CHOL/HDL Ratio: 2.2 RATIO
Triglycerides: 83 mg/dL (ref <150)
VLDL: 17 mg/dL (ref 0–40)

## 2020-09-12 LAB — ECHOCARDIOGRAM COMPLETE BUBBLE STUDY
Area-P 1/2: 6.6 cm2
S' Lateral: 3.3 cm

## 2020-09-12 LAB — HEMOGLOBIN A1C
Hgb A1c MFr Bld: 6 % — ABNORMAL HIGH (ref 4.8–5.6)
Mean Plasma Glucose: 125.5 mg/dL

## 2020-09-12 LAB — CBG MONITORING, ED: Glucose-Capillary: 140 mg/dL — ABNORMAL HIGH (ref 70–99)

## 2020-09-12 MED ORDER — ENOXAPARIN SODIUM 40 MG/0.4ML IJ SOSY
40.0000 mg | PREFILLED_SYRINGE | INTRAMUSCULAR | Status: DC
  Administered 2020-09-12: 40 mg via SUBCUTANEOUS
  Filled 2020-09-12: qty 0.4

## 2020-09-12 MED ORDER — ASPIRIN EC 81 MG PO TBEC: 81.0000 mg | DELAYED_RELEASE_TABLET | Freq: Every day | ORAL | Status: DC

## 2020-09-12 MED ORDER — PANTOPRAZOLE SODIUM 40 MG PO TBEC
40.0000 mg | DELAYED_RELEASE_TABLET | Freq: Every day | ORAL | Status: DC
  Filled 2020-09-12: qty 1

## 2020-09-12 MED ORDER — ONDANSETRON HCL 4 MG/2ML IJ SOLN: 4.0000 mg | Freq: Four times a day (QID) | INTRAMUSCULAR | Status: DC | PRN

## 2020-09-12 MED ORDER — SODIUM CHLORIDE 0.9% FLUSH
10.0000 mL | Freq: Once | INTRAVENOUS | Status: AC
  Administered 2020-09-12: 10 mL via INTRAVENOUS

## 2020-09-12 MED ORDER — POLYETHYLENE GLYCOL 3350 17 G PO PACK: 17.0000 g | PACK | Freq: Every day | ORAL | Status: DC | PRN

## 2020-09-12 MED ORDER — STROKE: EARLY STAGES OF RECOVERY BOOK
Freq: Once | Status: AC
  Filled 2020-09-12: qty 1

## 2020-09-12 MED ORDER — HYDRALAZINE HCL 20 MG/ML IJ SOLN: 10.0000 mg | Freq: Four times a day (QID) | INTRAMUSCULAR | Status: DC | PRN

## 2020-09-12 MED ORDER — INSULIN ASPART 100 UNIT/ML IJ SOLN
0.0000 [IU] | Freq: Three times a day (TID) | INTRAMUSCULAR | Status: DC
  Administered 2020-09-12: 2 [IU] via SUBCUTANEOUS

## 2020-09-12 MED ORDER — ASPIRIN EC 81 MG PO TBEC
81.0000 mg | DELAYED_RELEASE_TABLET | Freq: Every day | ORAL | Status: DC
  Administered 2020-09-12: 81 mg via ORAL
  Filled 2020-09-12 (×2): qty 1

## 2020-09-12 MED ORDER — ACETAMINOPHEN 160 MG/5ML PO SOLN: 650.0000 mg | ORAL | Status: DC | PRN

## 2020-09-12 MED ORDER — LORATADINE 10 MG PO TABS
10.0000 mg | ORAL_TABLET | Freq: Every day | ORAL | Status: DC
  Administered 2020-09-12: 10 mg via ORAL
  Filled 2020-09-12: qty 1

## 2020-09-12 MED ORDER — SODIUM CHLORIDE 0.9 % IV SOLN: INTRAVENOUS | Status: AC

## 2020-09-12 MED ORDER — ATORVASTATIN CALCIUM 10 MG PO TABS: 20.0000 mg | ORAL_TABLET | Freq: Every day | ORAL | Status: DC

## 2020-09-12 MED ORDER — SODIUM CHLORIDE 0.9 % IV BOLUS
500.0000 mL | Freq: Once | INTRAVENOUS | Status: AC
  Administered 2020-09-12: 500 mL via INTRAVENOUS

## 2020-09-12 MED ORDER — ACETAMINOPHEN 650 MG RE SUPP: 650.0000 mg | RECTAL | Status: DC | PRN

## 2020-09-12 MED ORDER — ACETAMINOPHEN 325 MG PO TABS: 650.0000 mg | ORAL_TABLET | ORAL | Status: DC | PRN

## 2020-09-12 NOTE — ED Notes (Signed)
Pt up tp bathroom with steady gait. No assistance needed.

## 2020-09-12 NOTE — Progress Notes (Signed)
VASCULAR LAB    Carotid duplex has been performed.  See CV proc for preliminary results.   Ronae Noell, RVT 09/12/2020, 9:58 AM

## 2020-09-12 NOTE — Progress Notes (Signed)
PT Screen Note  Patient Details Name: Timothy Brewer MRN: [id number] DOB: 06/18/1940   Screen: PT orders received and chart reviewed.  At arrival pt sitting EOB eating.  Pt has been ambulating independently.  Observed pt ambulating safely  earlier in day.  He reports all symptoms have resolved and denies PT needs. Will sign off at this time.  Abran Richard, PT Acute Rehab Services Pager [phone] [facility] 09/12/2020, 1:16 PM

## 2020-09-12 NOTE — Progress Notes (Addendum)
STROKE TEAM PROGRESS NOTE   INTERVAL HISTORY Patient  seen in Emergency department in bed, NAD, able to follow commands. Symptoms have resolved. Brillinta caused SOB. Likely have patient on ASA and Cilostazol for 1 month. As he has had adverse reactions with plavix as well.   Vitals:   09/12/20 0300 09/12/20 0455 09/12/20 0600 09/12/20 0800  BP: (!) 153/76 (!) 147/74 140/64 (!) 163/70  Pulse: 70 72 64 83  Resp: 17 18 17  (!) 21  Temp:      TempSrc:      SpO2: 97% 93% 98% 97%   CBC:  Recent Labs  Lab 09/11/20 1851 09/11/20 2334 09/12/20 0500  WBC 6.1  --  6.4  NEUTROABS 3.6  --  3.8  HGB 13.2 13.3 13.3  HCT 39.8 39.0 40.1  MCV 95.7  --  97.1  PLT 263  --  387   Basic Metabolic Panel:  Recent Labs  Lab 09/11/20 2035 09/11/20 2334 09/12/20 0500  NA 134* 137 137  K 5.7* 3.9 4.9  CL 102 107 108  CO2 22  --  22  GLUCOSE 99 177* 201*  BUN 18 18 16   CREATININE 1.13 1.10 1.16  CALCIUM 8.9  --  8.6*  MG  --   --  2.0   Lipid Panel:  Recent Labs  Lab 09/12/20 0500  CHOL 163  TRIG 83  HDL 74  CHOLHDL 2.2  VLDL 17  LDLCALC 72   HgbA1c:  Recent Labs  Lab 09/12/20 0500  HGBA1C 6.0*     IMAGING past 24 hours CT HEAD WO CONTRAST  Result Date: 09/11/2020 CLINICAL DATA:  TIA EXAM: CT HEAD WITHOUT CONTRAST TECHNIQUE: Contiguous axial images were obtained from the base of the skull through the vertex without intravenous contrast. COMPARISON:  MR brain dated 06/03/2020 FINDINGS: Brain: No evidence of acute infarction, hemorrhage, hydrocephalus, extra-axial collection or mass lesion/mass effect. Subcortical white matter and periventricular small vessel ischemic changes. Vascular: Mild intracranial atherosclerosis. Skull: Normal. Negative for fracture or focal lesion. Sinuses/Orbits: Mucosal thickening in the left maxillary sinus. Visualized paranasal sinuses and mastoid air cells are clear. Other: None. IMPRESSION: No evidence of acute intracranial abnormality. Small vessel  ischemic changes. Electronically Signed   By: Julian Hy M.D.   On: 09/11/2020 19:19   MR Brain Wo Contrast (neuro protocol)  Result Date: 09/11/2020 CLINICAL DATA:  Transient ischemic attack EXAM: MRI HEAD WITHOUT CONTRAST TECHNIQUE: Multiplanar, multiecho pulse sequences of the brain and surrounding structures were obtained without intravenous contrast. COMPARISON:  06/03/2020 FINDINGS: Brain: No acute infarct, mass effect or extra-axial collection. No acute or chronic hemorrhage. There is multifocal hyperintense T2-weighted signal within the white matter. Generalized volume loss without a clear lobar predilection. The midline structures are normal. Vascular: Major flow voids are preserved. Skull and upper cervical spine: Normal calvarium and skull base. Visualized upper cervical spine and soft tissues are normal. Sinuses/Orbits:No paranasal sinus fluid levels or advanced mucosal thickening. No mastoid or middle ear effusion. Normal orbits. IMPRESSION: 1. No acute intracranial abnormality. 2. 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 Electronically Signed   By: Ulyses Jarred M.D.   On: 09/11/2020 22:52    PHYSICAL EXAM Pleasant elderly Caucasian male not in distress. . Afebrile. Head is nontraumatic. Neck is supple without bruit.    Cardiac exam no murmur or gallop. Lungs are clear to auscultation. Distal pulses are well felt.  Neurological Exam ;  Awake  Alert oriented x 3. Normal speech  and language.eye movements full without nystagmus.fundi were not visualized. Vision acuity and fields appear normal. Hearing is normal. Palatal movements are normal. Face symmetric. Tongue midline. Normal strength, tone, reflexes and coordination. Normal sensation. Gait deferred.  ASSESSMENT/PLAN Timothy Brewer is a 80 y.o. male with PMH significant for DM2, HTN, prior TIA who had a bout of sneezing followed by vertigo which resolved in a couple mins. Reports that immediately  afterwards, he had a 20 mins episode of L facial numbness including the jaw and the inside of his mouth and left hand numbness. He immediately called EMS and the symptoms resolved just prior to coming to the ED. Last about 20 mins. No headaches. Takes aspirin 81mg  daily at home..   TIA:   CT head No acute abnormality. MRI  no acute intracranial abnormality Carotid Doppler : Right Carotid: Velocities in the right ICA are consistent with a 1-39%  stenosis.   Left Carotid: Velocities in the left ICA are consistent with a 40-59%  stenosis.   Vertebrals:  Right vertebral artery demonstrates antegrade flow. Left  "Bunny sign".  Subclavians: Left subclavian artery was stenotic. Normal flow hemodynamics  were seen in the right subclavian artery. Vas [place] transcranial: pending 2D Echo pending  LDL 72 HgbA1c 6.0 VTE prophylaxis - lovenox    Diet   Diet heart healthy/carb modified Room service appropriate? Yes; Fluid consistency: Thin   aspirin 81 mg daily prior to admission, now on aspirin 81 mg daily. ASA and pletal Therapy recommendations:  none Disposition:  home  Hypertension Home meds:  valsartan Stable Permissive hypertension (OK if < 220/120) but gradually normalize in 5-7 days Long-term BP goal normotensive  Hyperlipidemia Home meds:  lipitor 40 mg daily, resumed in hospital LDL 72, goal < 70 Continue statin at discharge  Diabetes type II Controlled Home meds:  metformin HgbA1c 6.0, goal < 7.0 CBGs Recent Labs    09/12/20 0739  GLUCAP 140*      Other Stroke Risk Factors Advanced Age >/= 65  Obesity, There is no height or weight on file to calculate BMI., BMI >/= 30 associated with increased stroke risk, recommend weight loss, diet and exercise as appropriate  Hx TIA Family hx stroke ([facility])  Other [facility] day # 0 Laurey Morale, MSN, NP-C [facility] [phone]  I have personally obtained history,examined this patient,  reviewed notes, independently viewed imaging studies, participated in medical decision making and plan of care.ROS completed by me personally and pertinent positives fully documented  I have made any additions or clarifications directly to the above note. Agree with note above.  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  Patient has history of allergy to Plavix and shortness of breath with Brilinta and is recommend aspirin and cilostazol for a month followed by aspirin alone.  Maintain aggressive risk factor modification.  Greater than 50% time during the 35-minute visit was spent in counseling and coordination of care about his TIA and discussion about stroke prevention and treatment and answering questions.  Follow-up as an outpatient stroke clinic in 6 to 8 weeks.  Antony Contras, MD Medical Director [facility] Pager: [phone] 09/12/2020 4:08 PM  To contact Stroke Continuity provider, please refer to [email or web address] After hours, contact General  Neurology

## 2020-09-12 NOTE — ED Notes (Signed)
Pt to vascular.

## 2020-09-12 NOTE — ED Notes (Signed)
ECHO at bedside.

## 2020-09-12 NOTE — H&P (Signed)
History and Physical    Timothy Brewer PJK:[id number] DOB: August 19, 1940 DOA: 09/11/2020  PCP: Antony Contras, MD  Patient coming from: Home via EMS   Chief Complaint:  Chief Complaint  Patient presents with   Transient Ischemic Attack     HPI:    80 year old male with past medical history of previous stroke 06/2017, previous TIA 10/996, diastolic congestive heart failure (Echo 2019 EF 60-65% with G1DD), gastroesophageal reflux disease, diabetes mellitus type 2, hypertension, hyperlipidemia who presents to [facility] emergency department with complaints of gait ataxia and left facial numbness and tingling.  Patient explains that yesterday afternoon at approximately 4:25 PM patient was at home and suddenly had a fit of sneezing.  Patient recalls sneezing approximately 5 times in succession.  Shortly after sneezing, the patient felt very disoriented and attempted to walk to the kitchen but noticed that he felt very unsteady and kept "leaning to the left."  Patient states that he nearly fell because of this but did not.  At the same time patient also experienced significant tingling and numbness of the left side of his face.  Finally, patient additionally felt that he was slurring his words at approximately the same time.  Patient denies any associated visual changes, headaches, focal weakness confusion.  Because of this constellation of symptoms the patient contacted EMS who promptly came to evaluate the patient and brought him into [facility] emergency room for evaluation.  Of the tongue patient was arriving at [facility], felt that his symptoms had nearly resolved.  Patient states that his symptoms had lasted for a total of approximately 20 minutes.  Upon evaluation in the emergency department patient was promptly evaluated by neurology and was recommended the patient be placed in observation and undergo cerebrovascular imaging as well as serial neurologic checks and  telemetry.  The hospitalist group was then called to assess the patient for admission the hospital.  Review of Systems:   Review of Systems  Neurological:  Positive for dizziness, tingling, sensory change and weakness.  All other systems reviewed and are negative.  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 His smoking use included cigarettes. He has never used smokeless tobacco. He reports current alcohol use of about 1.0 standard drink of alcohol per week. He reports that he does not use drugs.  Allergies  Allergen Reactions   Brilinta [Ticagrelor] Shortness Of Breath   Iodine Hives and Other (See Comments)    Internal only    Family History  Problem Relation Age of Onset   Stroke Maternal Grandmother      Prior to Admission medications   Medication Sig Start Date End Date Taking? Authorizing Provider  AMOXICILLIN PO Take 500 mg by mouth daily as needed (dental work). For dental work  Yes [provider]  Apoaequorin (PREVAGEN) 10 MG CAPS Take 1 capsule by mouth daily.   Yes [provider]  aspirin EC 81 MG tablet Take 1 tablet (81 mg total) by mouth daily. Swallow whole. 06/04/20 06/04/21 Yes Vann, Jessica U, DO  atorvastatin (LIPITOR) 20 MG tablet Take 1 tablet (20 mg total) by mouth daily at 6 PM. 06/08/17  Yes Regalado, Belkys A, MD  Calcium Carb-Cholecalciferol (CALCIUM 600 + D PO) Take 1 tablet by  mouth daily.    Yes [provider]  cetirizine (ZYRTEC) 10 MG tablet Take 10 mg by mouth daily.   Yes [provider]  fexofenadine (ALLEGRA) 180 MG tablet Take 180 mg by mouth every evening.    Yes [provider]  magnesium oxide (MAG-OX) 400 MG tablet Take 400 mg by mouth daily.   Yes [provider]  metFORMIN (GLUCOPHAGE-XR) 500 MG 24 hr tablet Take 1 tablet (500 mg total) by mouth 2 (two) times daily. 06/05/20  Yes Geradine Girt, DO  Multiple Vitamins-Minerals (PRESERVISION AREDS PO) Take by mouth 2 (two) times daily.   Yes [provider]  omeprazole (PRILOSEC) 20 MG capsule Take 20 mg by mouth daily.   Yes [provider]  polyethylene glycol (MIRALAX / GLYCOLAX) packet Take 17 g by mouth 2 (two) times daily. Patient taking differently: Take 17 g by mouth daily as needed for mild constipation. 02/17/17  Yes Babish, Rodman Key, PA-C  sildenafil (REVATIO) 20 MG tablet Take 40-100 mg by mouth daily as needed (erectile dysfunction). 01/19/19  Yes [provider]  valsartan (DIOVAN) 80 MG tablet Take 1 tablet (80 mg total) by mouth daily. 06/06/20  Yes Geradine Girt, DO  vitamin B-12 (CYANOCOBALAMIN) 500 MCG tablet Take 500 mcg by mouth daily.   Yes [provider]  ferrous sulfate (FERROUSUL) 325 (65 FE) MG tablet Take 1 tablet (325 mg total) by mouth 3 (three) times daily with meals. Patient not taking: Reported on 09/12/2020 02/17/17   Danae Orleans, PA-C    Physical Exam: Vitals:   09/12/20 0015 09/12/20 0030 09/12/20 0115 09/12/20 0130  BP: (!) 172/62 128/88 (!) 127/51   Pulse: 71 63 (!) 58 63  Resp: 19 20 18 14   Temp:      TempSrc:      SpO2: 97% 98% 93% 93%    Constitutional: Awake alert and oriented x3, no associated distress.   Skin: no rashes, no lesions, good skin turgor noted. Eyes: Pupils are equally reactive to light.  No evidence of scleral icterus or conjunctival pallor.  ENMT: Moist mucous  membranes noted.  Posterior pharynx clear of any exudate or lesions.   Neck: normal, supple, no masses, no thyromegaly.  No evidence of jugular venous distension.   Respiratory: clear to auscultation bilaterally, no wheezing, no crackles. Normal respiratory effort. No accessory muscle use.  Cardiovascular: Regular rate and rhythm, no murmurs / rubs / gallops. No extremity edema. 2+ pedal pulses. No carotid bruits.  Chest:   Nontender without crepitus or deformity.   Back:   Nontender without crepitus or deformity. Abdomen: Abdomen is soft and nontender.  No evidence of intra-abdominal masses.  Positive bowel sounds noted in all quadrants.   Musculoskeletal: No joint deformity upper and lower extremities. Good ROM, no contractures. Normal muscle tone.  Neurologic: CN 2-12 grossly intact. Sensation intact.  Patient moving all 4 extremities spontaneously.  Patient is following all commands.  Patient is responsive to verbal stimuli.   Psychiatric:  Patient exhibits normal mood with appropriate affect.  Patient seems to possess insight as to their current situation.     Labs on Admission: I have personally reviewed following labs and imaging studies -   CBC: Recent Labs  Lab 09/11/20 1851 09/11/20 2334  WBC 6.1  --   NEUTROABS 3.6  --   HGB 13.2 13.3  HCT 39.8 39.0  MCV 95.7  --   PLT 263  --    Basic Metabolic Panel: Recent Labs  Lab 09/11/20 1851 09/11/20 2035 09/11/20 2334  NA 134* 134* 137  K 5.8* 5.7* 3.9  CL 105 102 107  CO2 22 22  --   GLUCOSE 102* 99 177*  BUN 18 18 18   CREATININE 1.13 1.13 1.10  CALCIUM 8.8* 8.9  --    GFR: CrCl cannot be calculated (Unknown ideal weight.). Liver Function Tests: Recent Labs  Lab 09/11/20 1851  AST 37  ALT 20  ALKPHOS 44  BILITOT 0.1*  PROT 6.2*  ALBUMIN 3.6   No results for input(s): LIPASE, AMYLASE in the last 168 hours. No results for input(s): AMMONIA in the last 168 hours. Coagulation Profile: Recent Labs  Lab  09/11/20 1851  INR 0.9   Cardiac Enzymes: No results for input(s): CKTOTAL, CKMB, CKMBINDEX, TROPONINI in the last 168 hours. BNP (last 3 results) No results for input(s): PROBNP in the last 8760 hours. HbA1C: No results for input(s): HGBA1C in the last 72 hours. CBG: No results for input(s): GLUCAP in the last 168 hours. Lipid Profile: No results for input(s): CHOL, HDL, LDLCALC, TRIG, CHOLHDL, LDLDIRECT in the last 72 hours. Thyroid Function Tests: No results for input(s): TSH, T4TOTAL, FREET4, T3FREE, THYROIDAB in the last 72 hours. Anemia Panel: No results for input(s): VITAMINB12, FOLATE, FERRITIN, TIBC, IRON, RETICCTPCT in the last 72 hours. Urine analysis:    Component Value Date/Time   COLORURINE YELLOW 09/11/2020 2120   APPEARANCEUR CLEAR 09/11/2020 2120   LABSPEC 1.011 09/11/2020 2120   PHURINE 5.0 09/11/2020 2120   GLUCOSEU NEGATIVE 09/11/2020 2120   HGBUR NEGATIVE 09/11/2020 2120   [place] NEGATIVE 09/11/2020 2120   [place] NEGATIVE 09/11/2020 2120   PROTEINUR NEGATIVE 09/11/2020 2120   NITRITE NEGATIVE 09/11/2020 2120   LEUKOCYTESUR NEGATIVE 09/11/2020 2120    Radiological Exams on Admission - Personally Reviewed: CT HEAD WO CONTRAST  Result Date: 09/11/2020 CLINICAL DATA:  TIA EXAM: CT HEAD WITHOUT CONTRAST TECHNIQUE: Contiguous axial images were obtained from the base of the skull through the vertex without intravenous contrast. COMPARISON:  MR brain dated 06/03/2020 FINDINGS: Brain: No evidence of acute infarction, hemorrhage, hydrocephalus, extra-axial collection or mass lesion/mass effect. Subcortical white matter and periventricular small vessel ischemic changes. Vascular: Mild intracranial atherosclerosis. Skull: Normal. Negative for fracture or focal lesion. Sinuses/Orbits: Mucosal thickening in the left maxillary sinus. Visualized paranasal sinuses and mastoid air cells are clear. Other: None. IMPRESSION: No evidence of acute intracranial abnormality.  Small vessel ischemic changes. Electronically Signed   By: Julian Hy M.D.   On: 09/11/2020 19:19   MR Brain Wo Contrast (neuro protocol)  Result Date: 09/11/2020 CLINICAL DATA:  Transient ischemic attack EXAM: MRI HEAD WITHOUT CONTRAST TECHNIQUE: Multiplanar, multiecho pulse sequences of the brain and surrounding structures were obtained without intravenous contrast. COMPARISON:  06/03/2020 FINDINGS: Brain: No acute infarct, mass effect or extra-axial collection. No acute or chronic hemorrhage. There is multifocal hyperintense T2-weighted signal within the white matter. Generalized volume loss without a clear lobar predilection. The midline structures are normal. Vascular:  Major flow voids are preserved. Skull and upper cervical spine: Normal calvarium and skull base. Visualized upper cervical spine and soft tissues are normal. Sinuses/Orbits:No paranasal sinus fluid levels or advanced mucosal thickening. No mastoid or middle ear effusion. Normal orbits. IMPRESSION: 1. No acute intracranial abnormality. 2. 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 Electronically Signed   By: Ulyses Jarred M.D.   On: 09/11/2020 22:52    EKG: Personally reviewed.  Rhythm is normal sinus rhythm with heart rate of 69 bpm.  Left anterior fascicular block.  No dynamic ST segment changes appreciated.  Assessment/Plan Principal Problem:   TIA (transient ischemic attack)  Patient presenting with sudden onset of gait ataxia and left facial tingling with slurred speech. Symptoms lasted approximately 20 minutes consistent with TIA Noncontrast CT imaging of the head and MRI brain without contrast unremarkable. Patient is already been evaluated by Dr. Lorrin Goodell with neurology, their input is appreciated. Neurology recommends carotid ultrasound.   They recommend obtaining the echocardiogram interpretation from March since there does not seem to be one in epic.  If this can be obtained the  patient does not need to repeat echocardiogram at this time. Per neurology recommendations patient is receiving aspirin 81 mg daily. Serial neurologic checks Monitoring patient on telemetry PT, OT, SLP evaluation  Active Problems:   Hyperkalemia  Patient presenting with mild hyperkalemia without associated EKG changes Clinically, patient appears slightly volume depleted which may have been the culprit Furthermore, patient is on valsartan in the outpatient setting which could also be contributing to the hyperkalemia Hydrating patient gently, monitoring potassium levels with serial chemistry Temporarily holding valsartan Monitoring patient on telemetry    Type 2 diabetes mellitus with hyperglycemia, without long-term current use of insulin ([place])  Patient been placed on Accu-Cheks before every meal and nightly with sliding scale insulin Holding home regimen of hypoglycemics Hemoglobin A1C ordered Diabetic Diet    Essential hypertension  Permissive hypertension per neurology recommendations    GERD without esophagitis  Continuing home regimen of daily PPI therapy.    Mixed diabetic hyperlipidemia associated with type 2 diabetes mellitus ([place])  Continuing home regimen of lipid lowering therapy. Will titrate regimen upwards if LDL is greater than 70.    Chronic diastolic (congestive) heart failure ([facility])  No evidence of cardiogenic volume overload at this time   Code Status:  Full code Family Communication: deferred   Status is: Observation  The patient remains OBS appropriate and will d/c before 2 midnights.  Dispo: The patient is from: Home              Anticipated d/c is to: Home              Patient currently is not medically stable to d/c.   Difficult to place patient No        Vernelle Emerald MD [facility] Pager [phone]  If 7PM-7AM, please contact night-coverage [email or web address] Use universal [place] password for that web site. If you do  not have the password, please call the hospital operator.  09/12/2020, 2:13 AM

## 2020-09-12 NOTE — Progress Notes (Signed)
VASCULAR LAB    TCD has been performed.  See CV proc for preliminary results.   Edessa Jakubowicz, RVT 09/12/2020, 9:58 AM

## 2020-09-12 NOTE — Discharge Summary (Signed)
Physician Discharge Summary  [name] KDT:[id number] DOB: 09-03-1940 DOA: 09/11/2020  PCP: Timothy Contras, MD  Admit date: 09/11/2020 Discharge date: 09/12/2020  Admitted From: Home Disposition: Home  Recommendations for Outpatient Follow-up:  Follow ups as below. Please obtain CBC/BMP/Mag at follow up Outpatient follow-up with neurology in 6 to 8 weeks Please follow up on the following pending results: None  Home Health: None indicated Equipment/Devices: None indicated  Discharge Condition: Stable CODE STATUS: Full code   Follow-up Information     Timothy Contras, MD. Schedule an appointment as soon as possible for a visit in 1 week(s).   Specialty: Family Medicine Why: If symptoms worsen, As needed Contact information: [place] [address] [phone]                 Hospital Course: 80 year old M with PMH of CVA in 2019, TIA in 80, diastolic CHF, DM-2, HTN, HLD and GERD presenting with transient gait ataxia and left facial numbness and tingling after bouts of sneezing, that has resolved in ED.  Imaging including CT head and MRI brain did not show acute CVA or hemorrhage.  Bubble TTE basically normal.  Carotid Doppler without significant flow obstructing stenosis.  Patient remained stable from neuro standpoint.  Evaluated by neurology who recommended emphasis on risk reduction and outpatient follow-up in 6 to 8 weeks.  Unfortunately, patient has allergies to Plavix and Brilinta.  He will continue aspirin and a statin.  Therapy consulted but he was noted to ambulate independently.  Therapy signed off.  See individual problem list below for more on hospital course. Discharge Diagnoses:  Transient ischemic attack-no acute finding on CT head or MRI brain.  Bubble study TTE and carotid ultrasound without significant finding.  TCD pending. -Patient to continue low-dose aspirin and high intensity statin -Outpatient follow-up with  neurology in 6 to 8 weeks  Chronic diastolic CHF: Appears euvolemic.  Not on diuretics at home. -Risk reduction  Controlled NIDDM-2 with hyperglycemia and hyperlipidemia: A1c 6.0%.  On metformin at home. Recent Labs  Lab 09/12/20 0739  GLUCAP 140*  -Continue home metformin and Lipitor  Mild hyperkalemia: Could be due to ARB.  Resolved.   Essential hypertension: BP slightly elevated in the setting of holding home meds -Continue home medications  GERD without esophagitis -Continue home PPI   There is no height or weight on file to calculate BMI.            Discharge Exam: Vitals:   09/12/20 0800 09/12/20 1300  BP: (!) 163/70 (!) 162/71  Pulse: 83 76  Resp: (!) 21 16  Temp:    SpO2: 97% 98%    GENERAL: No apparent distress.  Nontoxic. HEENT: MMM.  Vision and hearing grossly intact.  NECK: Supple.  No apparent JVD.  RESP:  No IWOB.  Fair aeration bilaterally. CVS:  RRR. Heart sounds normal.  ABD/GI/GU: Bowel sounds present. Soft. Non tender.  MSK/EXT:  Moves extremities. No apparent deformity. No edema.  SKIN: no apparent skin lesion or wound NEURO: Awake, alert and oriented appropriately. Speech clear. Cranial nerves II-XII  intact. Motor 5/5 in all muscle groups of UE and LE bilaterally, Normal tone. Light sensation intact in all dermatomes of upper and lower ext bilaterally. Patellar reflex symmetric.  No pronator drift.  Finger to nose intact. PSYCH: Calm. Normal affect.   Discharge Instructions  Discharge Instructions     Diet - low sodium heart healthy   Complete by: As directed  Diet Carb Modified   Complete by: As directed    Discharge instructions   Complete by: As directed    It has been a pleasure taking care of you!  You were hospitalized with transient gait disturbance and left face numbness and tingling likely due to transient ischemic attack.  Your MRI did not show stroke.  Your symptoms resolved.  Continue taking your medications as  prescribed.  Please follow-up with your primary care doctor in 1 to 2 weeks as needed.    Take care,   Increase activity slowly   Complete by: As directed       Allergies as of 09/12/2020       Reactions   Brilinta [ticagrelor] Shortness Of Breath   Iodine Hives, Other (See Comments)   Internal only        Medication List     STOP taking these medications    cetirizine 10 MG tablet Commonly known as: ZYRTEC       TAKE these medications    AMOXICILLIN PO Take 500 mg by mouth daily as needed (dental work). For dental work   aspirin EC 81 MG tablet Take 1 tablet (81 mg total) by mouth daily. Swallow whole.   atorvastatin 20 MG tablet Commonly known as: LIPITOR Take 1 tablet (20 mg total) by mouth daily at 6 PM.   CALCIUM 600 + D PO Take 1 tablet by mouth daily.   fexofenadine 180 MG tablet Commonly known as: ALLEGRA Take 180 mg by mouth every evening.   magnesium oxide 400 MG tablet Commonly known as: MAG-OX Take 400 mg by mouth daily.   metFORMIN 500 MG 24 hr tablet Commonly known as: GLUCOPHAGE-XR Take 1 tablet (500 mg total) by mouth 2 (two) times daily.   omeprazole 20 MG capsule Commonly known as: PRILOSEC Take 20 mg by mouth daily.   polyethylene glycol 17 g packet Commonly known as: MIRALAX / GLYCOLAX Take 17 g by mouth daily as needed for mild constipation.   PRESERVISION AREDS PO Take by mouth 2 (two) times daily.   Prevagen 10 MG Caps Generic drug: Apoaequorin Take 1 capsule by mouth daily.   sildenafil 20 MG tablet Commonly known as: REVATIO Take 40-100 mg by mouth daily as needed (erectile dysfunction).   valsartan 80 MG tablet Commonly known as: DIOVAN Take 1 tablet (80 mg total) by mouth daily.   vitamin B-12 500 MCG tablet Commonly known as: CYANOCOBALAMIN Take 500 mcg by mouth daily.       ASK your doctor about these medications    ferrous sulfate 325 (65 FE) MG tablet Commonly known as: FerrouSul Take 1 tablet (325  mg total) by mouth 3 (three) times daily with meals.        Consultations: Neurology  Procedures/Studies:   CT HEAD WO CONTRAST  Result Date: 09/11/2020 CLINICAL DATA:  TIA EXAM: CT HEAD WITHOUT CONTRAST TECHNIQUE: Contiguous axial images were obtained from the base of the skull through the vertex without intravenous contrast. COMPARISON:  MR brain dated 06/03/2020 FINDINGS: Brain: No evidence of acute infarction, hemorrhage, hydrocephalus, extra-axial collection or mass lesion/mass effect. Subcortical white matter and periventricular small vessel ischemic changes. Vascular: Mild intracranial atherosclerosis. Skull: Normal. Negative for fracture or focal lesion. Sinuses/Orbits: Mucosal thickening in the left maxillary sinus. Visualized paranasal sinuses and mastoid air cells are clear. Other: None. IMPRESSION: No evidence of acute intracranial abnormality. Small vessel ischemic changes. Electronically Signed   By: Julian Hy M.D.   On: 09/11/2020  19:19   MR Brain Wo Contrast (neuro protocol)  Result Date: 09/11/2020 CLINICAL DATA:  Transient ischemic attack EXAM: MRI HEAD WITHOUT CONTRAST TECHNIQUE: Multiplanar, multiecho pulse sequences of the brain and surrounding structures were obtained without intravenous contrast. COMPARISON:  06/03/2020 FINDINGS: Brain: No acute infarct, mass effect or extra-axial collection. No acute or chronic hemorrhage. There is multifocal hyperintense T2-weighted signal within the white matter. Generalized volume loss without a clear lobar predilection. The midline structures are normal. Vascular: Major flow voids are preserved. Skull and upper cervical spine: Normal calvarium and skull base. Visualized upper cervical spine and soft tissues are normal. Sinuses/Orbits:No paranasal sinus fluid levels or advanced mucosal thickening. No mastoid or middle ear effusion. Normal orbits. IMPRESSION: 1. No acute intracranial abnormality. 2. Findings of chronic small vessel  ischemia and generalized volume loss. Electronically Signed   By: Ulyses Jarred M.D.   On: 09/11/2020 22:52   ECHOCARDIOGRAM COMPLETE BUBBLE STUDY  Result Date: 09/12/2020    ECHOCARDIOGRAM REPORT   Patient Name:   Timothy Brewer Date of Exam: 09/12/2020 Medical Rec #:  [id number]       Height:       67.0 in Accession #:    [id number]      Weight:       178.0 lb Date of Birth:  10-Apr-1940      BSA:          1.925 m Patient Age:    17 years        BP:           163/70 mmHg Patient Gender: M               HR:           96 bpm. Exam Location:  Inpatient Procedure: 2D Echo, Cardiac Doppler, Color Doppler, 3D Echo, Strain Analysis and            Saline Contrast Bubble Study Indications:    TIA (transient ischemic attack) 435.9 / G45.9  History:        Patient has prior history of Echocardiogram examinations, most                 recent 06/03/2020. CHF, TIA; Risk Factors:Hypertension, Diabetes                 and Dyslipidemia.  Sonographer:    Darlina Sicilian RDCS Referring Phys: [id number] [place]  1. Left ventricular ejection fraction, by estimation, is 55 to 60%. The left ventricle has normal function. The left ventricle has no regional wall motion abnormalities. Left ventricular diastolic parameters were normal.  2. Right ventricular systolic function is normal. The right ventricular size is normal. Tricuspid regurgitation signal is inadequate for assessing PA pressure.  3. The mitral valve is grossly normal. Trivial mitral valve regurgitation. No evidence of mitral stenosis.  4. The aortic valve is grossly normal. Aortic valve regurgitation is not visualized. No aortic stenosis is present.  5. The inferior vena cava is normal in size with greater than 50% respiratory variability, suggesting right atrial pressure of 3 mmHg.  6. Agitated saline contrast bubble study was negative, with no evidence of any interatrial shunt. Comparison(s): No significant change from prior study.  Conclusion(s)/Recommendation(s): No intracardiac source of embolism detected on this transthoracic study. A transesophageal echocardiogram is recommended to exclude cardiac source of embolism if clinically indicated. FINDINGS  Left Ventricle: Left ventricular ejection fraction, by estimation, is 55 to 60%. The left ventricle has normal  function. The left ventricle has no regional wall motion abnormalities. Global longitudinal strain performed but not reported based on interpreter judgement due to suboptimal tracking. The left ventricular internal cavity size was normal in size. There is no left ventricular hypertrophy. Left ventricular diastolic parameters were normal. Right Ventricle: The right ventricular size is normal. Right vetricular wall thickness was not well visualized. Right ventricular systolic function is normal. Tricuspid regurgitation signal is inadequate for assessing PA pressure. Left Atrium: Left atrial size was normal in size. Right Atrium: Right atrial size was not well visualized. Pericardium: There is no evidence of pericardial effusion. Presence of pericardial fat pad. Mitral Valve: The mitral valve is grossly normal. Trivial mitral valve regurgitation. No evidence of mitral valve stenosis. Tricuspid Valve: The tricuspid valve is normal in structure. Tricuspid valve regurgitation is trivial. No evidence of tricuspid stenosis. Aortic Valve: The aortic valve is grossly normal. Aortic valve regurgitation is not visualized. No aortic stenosis is present. Pulmonic Valve: The pulmonic valve was not well visualized. Pulmonic valve regurgitation is not visualized. Aorta: The aortic root, ascending aorta, aortic arch and descending aorta are all structurally normal, with no evidence of dilitation or obstruction. Venous: The inferior vena cava is normal in size with greater than 50% respiratory variability, suggesting right atrial pressure of 3 mmHg. IAS/Shunts: The atrial septum is grossly normal.  Agitated saline contrast was given intravenously to evaluate for intracardiac shunting. Agitated saline contrast bubble study was negative, with no evidence of any interatrial shunt.  LEFT VENTRICLE PLAX 2D LVIDd:         5.00 cm  Diastology LVIDs:         3.30 cm  LV e' medial:    6.65 cm/s LV PW:         0.90 cm  LV E/e' medial:  10.9 LV IVS:        0.70 cm  LV e' lateral:   7.66 cm/s LVOT diam:     2.00 cm  LV E/e' lateral: 9.5 LV SV:         59 LV SV Index:   31       2D Longitudinal Strain LVOT Area:     3.14 cm 2D Strain GLS (A2C):   -16.2 %                         2D Strain GLS (A3C):   -19.4 %                         2D Strain GLS (A4C):   -12.1 %                         2D Strain GLS Avg:     -15.9 %                          3D Volume EF:                         3D EF:        55 % RIGHT VENTRICLE RV S prime:     11.60 cm/s TAPSE (M-mode): 1.6 cm LEFT ATRIUM             Index LA diam:        3.40 cm 1.77 cm/m LA Vol (A2C):   13.7 ml 7.12 ml/m  LA Vol (A4C):   21.2 ml 11.02 ml/m LA Biplane Vol: 20.2 ml 10.50 ml/m  AORTIC VALVE LVOT Vmax:   79.10 cm/s LVOT Vmean:  49.800 cm/s LVOT VTI:    0.189 m  AORTA Ao Root diam: 3.20 cm MITRAL VALVE MV Area (PHT): 6.60 cm    SHUNTS MV Decel Time: 115 msec    Systemic VTI:  0.19 m MV E velocity: 72.50 cm/s  Systemic Diam: 2.00 cm MV A velocity: 78.40 cm/s MV E/A ratio:  0.92 Timothy Dresser MD Electronically signed by Timothy Dresser MD Signature Date/Time: 09/12/2020/4:09:22 PM    Final    VAS US CAROTID (at [facility] and [facility] only)  Result Date: 09/12/2020 Carotid Arterial Duplex Study Patient Name:  Timothy Brewer  Date of Exam:   09/12/2020 Medical Rec #: [id number]        Accession #:    [id number] Date of Birth: 10/08/1940       Patient Gender: M Patient Age:   79Y Exam Location:  [facility] Procedure:      VAS US CAROTID Referring Phys: [id number] [place] --------------------------------------------------------------------------------   Indications:       TIA and Gait ataxia, left facial numbness. Comparison Study:  Prior carotid duplex indicating 1-39% ICA stenosis done                    01/03/20 Performing Technologist: Sharion Dove RVS  Examination Guidelines: A complete evaluation includes B-mode imaging, spectral Doppler, color Doppler, and power Doppler as needed of all accessible portions of each vessel. Bilateral testing is considered an integral part of a complete examination. Limited examinations for reoccurring indications may be performed as noted.  Right Carotid Findings: +----------+--------+--------+--------+------------------+------------------+           PSV cm/sEDV cm/sStenosisPlaque DescriptionComments           +----------+--------+--------+--------+------------------+------------------+ CCA Prox  126     18                                intimal thickening +----------+--------+--------+--------+------------------+------------------+ CCA Distal88      14                                intimal thickening +----------+--------+--------+--------+------------------+------------------+ ICA Prox  132     28      1-39%   heterogenous                         +----------+--------+--------+--------+------------------+------------------+ ICA Distal109     24                                                   +----------+--------+--------+--------+------------------+------------------+ ECA       157     10                                                   +----------+--------+--------+--------+------------------+------------------+ +----------+--------+-------+--------+-------------------+           PSV cm/sEDV cmsDescribeArm Pressure (mmHG) +----------+--------+-------+--------+-------------------+ Subclavian122                                        +----------+--------+-------+--------+-------------------+ +---------+--------+--+--------+--+  VertebralPSV cm/s69EDV cm/s12  +---------+--------+--+--------+--+  Left Carotid Findings: +----------+--------+--------+--------+------------------+--------------------+           PSV cm/sEDV cm/sStenosisPlaque DescriptionComments             +----------+--------+--------+--------+------------------+--------------------+ CCA Prox  79      10                                intimal thickening   +----------+--------+--------+--------+------------------+--------------------+ CCA Distal74      11                                intimal thickening   +----------+--------+--------+--------+------------------+--------------------+ ICA Prox  224     52      40-59%  calcific          highest end of scale +----------+--------+--------+--------+------------------+--------------------+ ICA Mid   184     25                                                     +----------+--------+--------+--------+------------------+--------------------+ ICA Distal115     23                                                     +----------+--------+--------+--------+------------------+--------------------+ ECA       124     1                                                      +----------+--------+--------+--------+------------------+--------------------+ +----------+--------+--------+--------+-------------------+           PSV cm/sEDV cm/sDescribeArm Pressure (mmHG) +----------+--------+--------+--------+-------------------+ [id number]             Stenotic                    +----------+--------+--------+--------+-------------------+ +---------+--------+--+--------+-+--------+ VertebralPSV cm/s25EDV cm/s7Atypical +---------+--------+--+--------+-+--------+   Summary: Right Carotid: Velocities in the right ICA are consistent with a 1-39% stenosis. Left Carotid: Velocities in the left ICA are consistent with a 40-59% stenosis. Vertebrals:  Right vertebral artery demonstrates antegrade flow. Left "bunny               sign". Subclavians: Left subclavian artery was stenotic. Normal flow hemodynamics were              seen in the right subclavian artery. *See table(s) above for measurements and observations.     Preliminary    VAS [place] TRANSCRANIAL DOPPLER  Result Date: 09/12/2020  Transcranial Doppler Patient Name:  Timothy Brewer  Date of Exam:   09/12/2020 Medical Rec #: [id number]        Accession #:    [id number] Date of Birth: 02-12-1941       Patient Gender: M Patient Age:   79Y Exam Location:  [facility] Procedure:      VAS [place] TRANSCRANIAL DOPPLER Referring Phys: [id number] [place] --------------------------------------------------------------------------------  Indications: TIA. History: Recurrent TIA. Stroke 2019. Comparison Study: Prior study done 06/08/2017 Performing Technologist:  Sharion Dove RVS  Examination Guidelines: A complete evaluation includes B-mode imaging, spectral Doppler, color Doppler, and power Doppler as needed of all accessible portions of each vessel. Bilateral testing is considered an integral part of a complete examination. Limited examinations for reoccurring indications may be performed as noted.  +----------+-------------+----------+-----------+-------+ RIGHT TCD Right VM (cm)Depth (cm)PulsatilityComment +----------+-------------+----------+-----------+-------+ MCA           46.00                  0.9            +----------+-------------+----------+-----------+-------+ ACA          -22.00                 1.38            +----------+-------------+----------+-----------+-------+ Term ICA      31.00                  4.4            +----------+-------------+----------+-----------+-------+ PCA           15.00                 1.58            +----------+-------------+----------+-----------+-------+ Opthalmic     18.00                  1.4            +----------+-------------+----------+-----------+-------+ ICA siphon    24.00                 1.37             +----------+-------------+----------+-----------+-------+ Vertebral    -18.00                 0.89            +----------+-------------+----------+-----------+-------+  +----------+------------+----------+-----------+-------+ LEFT TCD  Left VM (cm)Depth (cm)PulsatilityComment +----------+------------+----------+-----------+-------+ MCA          33.00                 1.49            +----------+------------+----------+-----------+-------+ ACA          -25.00                1.62            +----------+------------+----------+-----------+-------+ Term ICA     35.00                 1.41            +----------+------------+----------+-----------+-------+ PCA          23.00                 1.23            +----------+------------+----------+-----------+-------+ Opthalmic    18.00                 1.34            +----------+------------+----------+-----------+-------+ ICA siphon   29.00                 1.41            +----------+------------+----------+-----------+-------+ Vertebral    -19.00                0.76            +----------+------------+----------+-----------+-------+  +------------+-------+-------+  VM cm/sComment +------------+-------+-------+ Prox Basilar-25.00         +------------+-------+-------+    Preliminary        The results of significant diagnostics from this hospitalization (including imaging, microbiology, ancillary and laboratory) are listed below for reference.     Microbiology: Recent Results (from the past 240 hour(s))  SARS CORONAVIRUS 2 (TAT 6-24 HRS) Nasopharyngeal Nasopharyngeal Swab     Status: None   Collection Time: 09/12/20  6:07 AM   Specimen: Nasopharyngeal Swab  Result Value Ref Range Status   SARS Coronavirus 2 NEGATIVE NEGATIVE Final    Comment: (NOTE) SARS-CoV-2 target nucleic acids are NOT DETECTED.  The SARS-CoV-2 RNA is generally detectable in upper and lower respiratory  specimens during the acute phase of infection. Negative results do not preclude SARS-CoV-2 infection, do not rule out co-infections with other pathogens, and should not be used as the sole basis for treatment or other patient management decisions. Negative results must be combined with clinical observations, patient history, and epidemiological information. The expected result is Negative.  Fact Sheet for Patients: SugarRoll.be  Fact Sheet for Healthcare Providers: https://www.woods-mathews.com/  This test is not yet approved or cleared by the [place] FDA and  has been authorized for detection and/or diagnosis of SARS-CoV-2 by FDA under an Emergency Use Authorization (EUA). This EUA will remain  in effect (meaning this test can be used) for the duration of the COVID-19 declaration under Se ction 564(b)(1) of the Act, 21 U.S.C. section 360bbb-3(b)(1), unless the authorization is terminated or revoked sooner.  Performed at [facility], [place] [address], [place],  [address]      Labs:  CBC: Recent Labs  Lab 09/11/20 1851 09/11/20 2334 09/12/20 0500  WBC 6.1  --  6.4  NEUTROABS 3.6  --  3.8  HGB 13.2 13.3 13.3  HCT 39.8 39.0 40.1  MCV 95.7  --  97.1  PLT 263  --  236   BMP &GFR Recent Labs  Lab 09/11/20 1851 09/11/20 2035 09/11/20 2334 09/12/20 0500  NA 134* 134* 137 137  K 5.8* 5.7* 3.9 4.9  CL 105 102 107 108  CO2 22 22  --  22  GLUCOSE 102* 99 177* 201*  BUN 18 18 18 16   CREATININE 1.13 1.13 1.10 1.16  CALCIUM 8.8* 8.9  --  8.6*  MG  --   --   --  2.0   CrCl cannot be calculated (Unknown ideal weight.). Liver & Pancreas: Recent Labs  Lab 09/11/20 1851 09/12/20 0500  AST 37 28  ALT 20 15  ALKPHOS 44 40  BILITOT 0.1* 0.8  PROT 6.2* 5.8*  ALBUMIN 3.6 3.4*   No results for input(s): LIPASE, AMYLASE in the last 168 hours. No results for input(s): AMMONIA in the last 168  hours. Diabetic: Recent Labs    09/12/20 0500  HGBA1C 6.0*   Recent Labs  Lab 09/12/20 0739  GLUCAP 140*   Cardiac Enzymes: No results for input(s): CKTOTAL, CKMB, CKMBINDEX, TROPONINI in the last 168 hours. No results for input(s): PROBNP in the last 8760 hours. Coagulation Profile: Recent Labs  Lab 09/11/20 1851  INR 0.9   Thyroid Function Tests: No results for input(s): TSH, T4TOTAL, FREET4, T3FREE, THYROIDAB in the last 72 hours. Lipid Profile: Recent Labs    09/12/20 0500  CHOL 163  HDL 74  LDLCALC 72  TRIG 83  CHOLHDL 2.2   Anemia Panel: No results for input(s): VITAMINB12, FOLATE, FERRITIN, TIBC, IRON, RETICCTPCT  in the last 72 hours. Urine analysis:    Component Value Date/Time   COLORURINE YELLOW 09/11/2020 2120   APPEARANCEUR CLEAR 09/11/2020 2120   LABSPEC 1.011 09/11/2020 2120   PHURINE 5.0 09/11/2020 2120   GLUCOSEU NEGATIVE 09/11/2020 2120   HGBUR NEGATIVE 09/11/2020 2120   [place] NEGATIVE 09/11/2020 2120   [place] NEGATIVE 09/11/2020 2120   PROTEINUR NEGATIVE 09/11/2020 2120   NITRITE NEGATIVE 09/11/2020 2120   LEUKOCYTESUR NEGATIVE 09/11/2020 2120   Sepsis Labs: Invalid input(s): PROCALCITONIN, LACTICIDVEN   Time coordinating discharge: 35 minutes  SIGNED:  Mercy Riding, MD  [facility] Hospitalists 09/12/2020, 6:20 PM  If 7PM-7AM, please contact night-coverage [email or web address]

## 2020-09-19 NOTE — Telephone Encounter (Signed)
Returned call to patient and discussed Dr. Clydene Fake recommendation.  Patient stated he needs a prescription for the plavix as he has finished the Brilinta.  He also mentioned he is going to retry the plavix because last time he was on it, he had hives and rash that he believes came from taking it.    Patient denied further questions, verbalized understanding and expressed appreciation for the phone call.

## 2020-09-20 ENCOUNTER — Other Ambulatory Visit: Payer: Self-pay | Admitting: Emergency Medicine

## 2020-09-20 MED ORDER — CLOPIDOGREL BISULFATE 75 MG PO TABS: 75.0000 mg | ORAL_TABLET | Freq: Every day | ORAL | 11 refills | Status: DC

## 2020-09-27 DIAGNOSIS — E782 Mixed hyperlipidemia: Secondary | ICD-10-CM | POA: Diagnosis not present

## 2020-09-27 DIAGNOSIS — Z0000 Encounter for general adult medical examination without abnormal findings: Secondary | ICD-10-CM | POA: Diagnosis not present

## 2020-09-27 DIAGNOSIS — Z1389 Encounter for screening for other disorder: Secondary | ICD-10-CM | POA: Diagnosis not present

## 2020-09-27 DIAGNOSIS — E1169 Type 2 diabetes mellitus with other specified complication: Secondary | ICD-10-CM | POA: Diagnosis not present

## 2020-09-27 DIAGNOSIS — I10 Essential (primary) hypertension: Secondary | ICD-10-CM | POA: Diagnosis not present

## 2020-09-27 DIAGNOSIS — J309 Allergic rhinitis, unspecified: Secondary | ICD-10-CM | POA: Diagnosis not present

## 2020-09-27 DIAGNOSIS — Z8673 Personal history of transient ischemic attack (TIA), and cerebral infarction without residual deficits: Secondary | ICD-10-CM | POA: Diagnosis not present

## 2020-09-27 DIAGNOSIS — J439 Emphysema, unspecified: Secondary | ICD-10-CM | POA: Diagnosis not present

## 2020-09-27 DIAGNOSIS — R413 Other amnesia: Secondary | ICD-10-CM | POA: Diagnosis not present

## 2020-09-27 DIAGNOSIS — I6930 Unspecified sequelae of cerebral infarction: Secondary | ICD-10-CM | POA: Diagnosis not present

## 2020-09-27 DIAGNOSIS — N529 Male erectile dysfunction, unspecified: Secondary | ICD-10-CM | POA: Diagnosis not present

## 2020-09-27 DIAGNOSIS — Z7984 Long term (current) use of oral hypoglycemic drugs: Secondary | ICD-10-CM | POA: Diagnosis not present

## 2020-10-02 DIAGNOSIS — Z1211 Encounter for screening for malignant neoplasm of colon: Secondary | ICD-10-CM | POA: Diagnosis not present

## 2020-10-05 ENCOUNTER — Other Ambulatory Visit: Payer: Self-pay | Admitting: Family Medicine

## 2020-10-05 DIAGNOSIS — M8580 Other specified disorders of bone density and structure, unspecified site: Secondary | ICD-10-CM

## 2020-10-11 ENCOUNTER — Other Ambulatory Visit: Payer: Self-pay

## 2020-10-11 ENCOUNTER — Ambulatory Visit
Admission: RE | Admit: 2020-10-11 | Discharge: 2020-10-11 | Disposition: A | Discharge status: Home / Self Care | Payer: PPO | Source: Ambulatory Visit | Attending: Family Medicine | Admitting: Family Medicine

## 2020-10-11 DIAGNOSIS — M85832 Other specified disorders of bone density and structure, left forearm: Secondary | ICD-10-CM | POA: Diagnosis not present

## 2020-10-11 DIAGNOSIS — M8580 Other specified disorders of bone density and structure, unspecified site: Secondary | ICD-10-CM

## 2020-10-12 DIAGNOSIS — M79641 Pain in right hand: Secondary | ICD-10-CM | POA: Diagnosis not present

## 2020-10-12 DIAGNOSIS — M65831 Other synovitis and tenosynovitis, right forearm: Secondary | ICD-10-CM | POA: Diagnosis not present

## 2020-10-17 DIAGNOSIS — M79641 Pain in right hand: Secondary | ICD-10-CM | POA: Diagnosis not present

## 2020-10-19 ENCOUNTER — Telehealth: Payer: Self-pay | Admitting: Diagnostic Neuroimaging

## 2020-10-19 NOTE — Telephone Encounter (Signed)
Patient called in. Having arthritis, was rx'd meloxicam by ortho PA. Then saw ortho MD who said stop meloxicam, b/c he is on plavix. Pt asking to stop plavix so he can take meloxicam.  Recommended to stay on plavix for stroke prevention. Use gentle exercises, topical creams, tylenol for tendonitis issue. Agree he should not use meloxicam as it can increase bleeding risk with plavix use.     Penni Bombard, MD [id number], [id number] PM Certified in Neurology, Neurophysiology and Neuroimaging  [facility] [address] [place], [place] [address] [phone]

## 2020-10-22 DIAGNOSIS — M25512 Pain in left shoulder: Secondary | ICD-10-CM | POA: Diagnosis not present

## 2020-10-23 ENCOUNTER — Other Ambulatory Visit: Payer: Self-pay | Admitting: Family Medicine

## 2020-10-23 DIAGNOSIS — M8580 Other specified disorders of bone density and structure, unspecified site: Secondary | ICD-10-CM

## 2020-10-28 ENCOUNTER — Other Ambulatory Visit: Payer: Self-pay

## 2020-10-28 ENCOUNTER — Emergency Department (HOSPITAL_BASED_OUTPATIENT_CLINIC_OR_DEPARTMENT_OTHER)
Admission: EM | Admit: 2020-10-28 | Discharge: 2020-10-28 | Disposition: A | Discharge status: Home / Self Care | Payer: PPO | Attending: Emergency Medicine | Admitting: Emergency Medicine

## 2020-10-28 ENCOUNTER — Encounter (HOSPITAL_BASED_OUTPATIENT_CLINIC_OR_DEPARTMENT_OTHER): Payer: Self-pay | Admitting: Emergency Medicine

## 2020-10-28 DIAGNOSIS — Z87891 Personal history of nicotine dependence: Secondary | ICD-10-CM | POA: Insufficient documentation

## 2020-10-28 DIAGNOSIS — Z96642 Presence of left artificial hip joint: Secondary | ICD-10-CM | POA: Insufficient documentation

## 2020-10-28 DIAGNOSIS — Z79899 Other long term (current) drug therapy: Secondary | ICD-10-CM | POA: Insufficient documentation

## 2020-10-28 DIAGNOSIS — Z7902 Long term (current) use of antithrombotics/antiplatelets: Secondary | ICD-10-CM | POA: Insufficient documentation

## 2020-10-28 DIAGNOSIS — I110 Hypertensive heart disease with heart failure: Secondary | ICD-10-CM | POA: Diagnosis not present

## 2020-10-28 DIAGNOSIS — I5032 Chronic diastolic (congestive) heart failure: Secondary | ICD-10-CM | POA: Diagnosis not present

## 2020-10-28 DIAGNOSIS — Z0000 Encounter for general adult medical examination without abnormal findings: Secondary | ICD-10-CM | POA: Diagnosis not present

## 2020-10-28 DIAGNOSIS — Z7982 Long term (current) use of aspirin: Secondary | ICD-10-CM | POA: Diagnosis not present

## 2020-10-28 DIAGNOSIS — Z7984 Long term (current) use of oral hypoglycemic drugs: Secondary | ICD-10-CM | POA: Diagnosis not present

## 2020-10-28 DIAGNOSIS — M159 Polyosteoarthritis, unspecified: Secondary | ICD-10-CM | POA: Diagnosis not present

## 2020-10-28 DIAGNOSIS — E119 Type 2 diabetes mellitus without complications: Secondary | ICD-10-CM | POA: Diagnosis not present

## 2020-10-28 NOTE — ED Triage Notes (Signed)
Pt is on plavix for TIA's. Restarted it in June (the 7th),pt is now presenting with 3 weeks of arthritic pain. Pt had cortisone shot last week in his left shoulder that helped but now hurting in hips. Pt states he knows it is plavix but doctor wants him to keep taking it.

## 2020-10-28 NOTE — ED Provider Notes (Signed)
[facility] EMERGENCY DEPT Provider Note   CSN: [id number] Arrival date & time: 10/28/20  0937     History Chief Complaint  Patient presents with   Joint Pain    Timothy Brewer is a 80 y.o. male.  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  Patient states he was restarted on Plavix a couple months ago.  Since restarting the Plavix he has been having pain in multiple joints.  He sees orthopedics, they have been doing cortisone injections to his shoulder.  However he believes that this arthritis is secondary to Plavix.  He states that neurology will not take him off of the Plavix.  Is been treating himself with Tylenol at home.  Last night he was having pain in his right hip that made sleeping difficult.  This morning he feels improved.  Has been ambulatory.  Denies any fever.  No current swollen/red joint.  No history of gout.  Past Medical History:  Diagnosis Date   Arthritis    Diabetes mellitus without complication ([place])    Hypertension    Memory loss    Pre-diabetes    on metformin   Stroke [facility])     Patient Active Problem List   Diagnosis Date Noted   Hyperkalemia 09/12/2020   Type 2 diabetes mellitus with hyperglycemia, without long-term current use of insulin ([place]) 09/12/2020   Essential hypertension 09/12/2020   GERD without esophagitis 09/12/2020   Mixed diabetic hyperlipidemia associated with type 2 diabetes mellitus ([place]) 09/12/2020   Chronic diastolic (congestive) heart failure ([place]) 09/12/2020   TIA (transient ischemic attack) 06/03/2020   Aphasia    Right leg weakness    CVA (cerebral vascular accident) ([place]) 06/07/2017   Overweight (BMI 25.0-29.9) 02/18/2017   S/P left THA, 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   Plantar fasciitis 06/09/2016    Past Surgical History:  Procedure Laterality Date   BACK SURGERY     c3,c4,c5,c6 ,l4,l5   EYE SURGERY     bil  cataracts   NASAL SEPTUM SURGERY     TONSILLECTOMY     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   Procedure: LEFT TOTAL HIP ARTHROPLASTY ANTERIOR APPROACH;  Surgeon: Paralee Cancel, MD;  Location: [facility];  Service: Orthopedics;  Laterality: Left;  70 mins       Family History  Problem Relation Age of Onset   Stroke Maternal Grandmother     Social History   Tobacco Use   Smoking status: Former    Types: Cigarettes    Quit date: 07/09/2014    Years since quitting: 6.3   Smokeless tobacco: Never   Tobacco comments:    8-10 a day  Vaping Use   Vaping Use: Never used  Substance Use Topics   Alcohol use: Yes    Alcohol/week: 1.0 standard drink    Types: 1 Glasses of wine per week    Comment: socially   Drug use: No    Home Medications Prior to Admission medications   Medication Sig Start Date End Date Taking? Authorizing Provider  Apoaequorin (PREVAGEN) 10 MG CAPS Take 1 capsule by mouth daily.   Yes [provider]  atorvastatin (LIPITOR) 20 MG tablet Take 1 tablet (20 mg total) by mouth daily at 6 PM. 06/08/17  Yes Regalado, Belkys A, MD  Calcium Carb-Cholecalciferol (CALCIUM 600 + D PO) Take 1 tablet by mouth daily.    Yes [provider]  clopidogrel (PLAVIX) 75  MG tablet Take 1 tablet (75 mg total) by mouth daily. 09/20/20  Yes Garvin Fila, MD  magnesium oxide (MAG-OX) 400 MG tablet Take 400 mg by mouth daily.   Yes [provider]  metFORMIN (GLUCOPHAGE-XR) 500 MG 24 hr tablet Take 1 tablet (500 mg total) by mouth 2 (two) times daily. 06/05/20  Yes Geradine Girt, DO  Multiple Vitamins-Minerals (PRESERVISION AREDS PO) Take by mouth 2 (two) times daily.   Yes [provider]  valsartan (DIOVAN) 80 MG tablet Take 1 tablet (80 mg total) by mouth daily. 06/06/20  Yes Geradine Girt, DO  vitamin B-12 (CYANOCOBALAMIN) 500 MCG tablet Take 500 mcg by mouth daily.   Yes [provider]  AMOXICILLIN PO Take 500 mg by mouth daily as needed  (dental work). For dental work    [provider]  aspirin EC 81 MG tablet Take 1 tablet (81 mg total) by mouth daily. Swallow whole. 06/04/20 06/04/21  Geradine Girt, DO  ferrous sulfate (FERROUSUL) 325 (65 FE) MG tablet Take 1 tablet (325 mg total) by mouth 3 (three) times daily with meals. Patient not taking: No sig reported 02/17/17   Danae Orleans, PA-C  fexofenadine (ALLEGRA) 180 MG tablet Take 180 mg by mouth every evening.     [provider]  omeprazole (PRILOSEC) 20 MG capsule Take 20 mg by mouth daily.    [provider]  polyethylene glycol (MIRALAX / GLYCOLAX) 17 g packet Take 17 g by mouth daily as needed for mild constipation. 09/12/20   Mercy Riding, MD  sildenafil (REVATIO) 20 MG tablet Take 40-100 mg by mouth daily as needed (erectile dysfunction). 01/19/19   [provider]    Allergies    Brilinta [ticagrelor] and Iodine  Review of Systems   Review of Systems  Constitutional:  Negative for chills and fever.  HENT:  Negative for congestion.   Respiratory:  Negative for shortness of breath.   Cardiovascular:  Negative for chest pain.  Gastrointestinal:  Negative for abdominal pain.  Genitourinary:  Negative for dysuria.  Musculoskeletal:  Positive for arthralgias. Negative for back pain, neck pain and neck stiffness.  Skin:  Negative for rash.  Neurological:  Negative for headaches.   Physical Exam Updated Vital Signs BP (!) 167/67   Pulse 98   Temp 98 F (36.7 C) (Oral)   Resp 20   SpO2 100%   Physical Exam Vitals and nursing note reviewed.  Constitutional:      Appearance: Normal appearance.  HENT:     Head: Normocephalic.     Mouth/Throat:     Mouth: Mucous membranes are moist.  Cardiovascular:     Rate and Rhythm: Normal rate.  Pulmonary:     Effort: Pulmonary effort is normal. No respiratory distress.  Abdominal:     Palpations: Abdomen is soft.     Tenderness: There is no abdominal tenderness.  Skin:     General: Skin is warm.  Neurological:     Mental Status: He is alert and oriented to person, place, and time. Mental status is at baseline.  Psychiatric:        Mood and Affect: Mood normal.    ED Results / Procedures / Treatments   Labs (all labs ordered are listed, but only abnormal results are displayed) Labs Reviewed - No data to display  EKG None  Radiology No results found.  Procedures Procedures   Medications Ordered in ED Medications - No data to display  ED Course  I have reviewed the triage vital signs and the nursing notes.  Pertinent labs & imaging results that were available during my care of the patient were reviewed by me and considered in my medical decision making (see chart for details).    MDM Rules/Calculators/A&P                            80 year old male presents emergency department with concern for multiple weeks of arthritic pain.  Vitals are normal and stable.  Physical exam is reassuring.  Patient states that the symptoms are secondary to his Plavix.  Patient has never had a work-up in regards to arthritis or rheumatology.  I have placed a rheumatology order for the patient.  Encouraged him to remain on the Plavix and to consult neurology for further guidance in regards to that.  I do not feel any stronger pain medicine is appropriate in this situation.  Patient right now has no specific complaint.  We will have him follow-up as an outpatient for reevaluation of Plavix and his current symptoms.  Patient at this time appears safe and stable for discharge and will be treated as an outpatient.  Discharge plan and strict return to ED precautions discussed, patient verbalizes understanding and agreement.  Final Clinical Impression(s) / ED Diagnoses Final diagnoses:  General medical exam    Rx / DC Orders ED Discharge Orders          Ordered    Ambulatory referral to Rheumatology        10/28/20 [place], Alvin Critchley,  DO 10/28/20 1101

## 2020-10-28 NOTE — Discharge Instructions (Addendum)
You have been seen and discharged from the emergency department.  A rheumatology referral has been placed, you should be contacted by their office.  If you do not hear back from them by Wednesday give them a call to establish an appointment.  Follow-up with your primary provider for reevaluation and further care. Take home medications as prescribed. If you have any worsening symptoms or further concerns for your health please return to an emergency department for further evaluation.

## 2020-11-02 DIAGNOSIS — M2550 Pain in unspecified joint: Secondary | ICD-10-CM | POA: Diagnosis not present

## 2020-11-16 DIAGNOSIS — E1169 Type 2 diabetes mellitus with other specified complication: Secondary | ICD-10-CM | POA: Diagnosis not present

## 2020-11-16 DIAGNOSIS — I10 Essential (primary) hypertension: Secondary | ICD-10-CM | POA: Diagnosis not present

## 2020-11-16 DIAGNOSIS — J439 Emphysema, unspecified: Secondary | ICD-10-CM | POA: Diagnosis not present

## 2020-11-16 DIAGNOSIS — E119 Type 2 diabetes mellitus without complications: Secondary | ICD-10-CM | POA: Diagnosis not present

## 2020-11-16 DIAGNOSIS — E782 Mixed hyperlipidemia: Secondary | ICD-10-CM | POA: Diagnosis not present

## 2020-11-16 DIAGNOSIS — M8580 Other specified disorders of bone density and structure, unspecified site: Secondary | ICD-10-CM | POA: Diagnosis not present

## 2020-12-26 ENCOUNTER — Ambulatory Visit: Payer: PPO | Admitting: Neurology

## 2020-12-26 ENCOUNTER — Encounter: Payer: Self-pay | Admitting: Neurology

## 2020-12-26 VITALS — BP 126/66 | HR 70 | Ht 67.0 in | Wt 175.5 lb

## 2020-12-26 DIAGNOSIS — R413 Other amnesia: Secondary | ICD-10-CM

## 2020-12-26 DIAGNOSIS — Z8673 Personal history of transient ischemic attack (TIA), and cerebral infarction without residual deficits: Secondary | ICD-10-CM | POA: Diagnosis not present

## 2020-12-26 NOTE — Progress Notes (Signed)
[facility] [address] [place]. [place] [address] [phone]       HOSPITAL FOLLOW UP NOTE  [name] Date of Birth:  04-01-1940 Medical Record Number:  [id number]   Reason for Referral:  hospital stroke follow up    SUBJECTIVE:   CHIEF COMPLAINT:  Chief Complaint  Patient presents with   Follow-up    Room 55 w/ friend, Timothy Brewer. Yearly follow up. Hx of TIA. MMSE: 29/30. Feels memory has continued to decline, especially short term.    HPI:   [name] is a 80 y.o. male with history of HTN, HLD, DM, former tobacco use, and history of prior stroke who presented on 06/03/2020 sudden onset confusion and speech difficulty lasting 25 to 30 minutes and transient right leg weakness.  Personally reviewed hospitalization pertinent progress notes, lab work and imaging with summary provided.  Evaluated by Dr. Leonie Man and likely posterior circulation TIA vs seizure with postictal confusion.  EEG negative.  Recommended aspirin and Brilinta for 30 days then aspirin alone.  LDL 64 on atorvastatin 20 mg daily.  A1c 6.3 on metformin.  Evaluated by therapies and discharged home in stable condition without therapy needs.  Stroke like episode, possible TIA  with negative stroke work up.    Code Stroke CT head No acute intracranial abnormalities. Chronic small vessel ischemic change and brain atrophy. MRI   No evidence of acute infarction, hemorrhage, or mass. Mild chronic microvascular ischemic changes MRA   No evidence of acute infarction, hemorrhage, or mass. Mild chronic microvascular ischemic changes. Normal MRA head. Carotid Doppler: Right Carotid: Velocities in the right ICA are consistent with a 1-39% stenosis.  Left Carotid: Velocities in the left ICA are consistent with a 1-39% stenosis.  Vertebrals: Bilateral vertebral arteries demonstrate antegrade flow.  2D Echo: done, with read pending EEG read is normal. No seizure activity.  LDL 64 HgbA1c  6.3 Antithrombotic: Not on antithrombotic PTA.  Recommended aspirin 81 mg daily and Brilinta for 4 weeks then aspirin alone Therapy recommendations:  none Disposition:  Home   Today, 07/09/2020, Timothy Brewer is being seen for hospital follow-up accompanied by his wife, Timothy Brewer  Doing well since discharge without any reoccurring or new stroke/TIA symptoms.  He has returned back to all prior activities without difficulty.  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  He is accompanied by his friend Timothy Brewer.  Patient had another episode of TIA on 09/11/2020.  He developed sudden onset of left face and hand numbness lasted about 20 minutes.  He also noticed that he was off balance.  Symptoms resolved by the time days to hospital.  MRI scan of the brain was obtained which I personally reviewed showed no acute abnormality.  Showed only changes of chronic small vessel disease and mild generalized atrophy.  2D echo showed normal ejection fraction of 55 to 60% without cardiac source of embolism.  Transcranial Doppler study on 09/18/2020 was normal.  LDL cholesterol 72 mg percent.  Hemoglobin A1c was 6.0.  Carotid ultrasound showed no significant extracranial stenosis.  Patient was changed from aspirin to Plavix which is tolerating well without bruising or bleeding.  He has had no recurrent TIA or stroke symptoms since then.  He states his blood pressure is under good control and  today it is 126/66.  He states his sugars are also under good control.  He is tolerating Lipitor well without any side effects.  Patient can continues to complain of mild short-term memory difficulties but these are mostly unchanged.  He is worried about developing Alzheimer's since his sister died of it.  On Mini-Mental status exam today scored 29/30 with only 1 deficit in recall.  He still mostly independent in activities of daily living. ROS:   14 system review of systems performed and negative with exception of no complaints  PMH:  Past Medical History:  Diagnosis Date   Arthritis    Diabetes mellitus without complication ([place])    Hypertension    Memory loss    Pre-diabetes    on metformin   Stroke ([place])     PSH:  Past Surgical History:  Procedure Laterality Date   BACK SURGERY     c3,c4,c5,c6 ,l4,l5   EYE SURGERY     bil cataracts   NASAL SEPTUM SURGERY     TONSILLECTOMY     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   Procedure: LEFT TOTAL HIP ARTHROPLASTY ANTERIOR APPROACH;  Surgeon: Paralee Cancel, MD;  Location: [facility];  Service: Orthopedics;  Laterality: Left;  70 mins    Social History:  Social History   Socioeconomic History   Marital status: Widowed    Spouse name: Not on file   Number of children: Not on file   Years of education: some college   Highest education level: Not on file  Occupational History   Occupation: retired  Tobacco Use   Smoking status: Former    Types: Cigarettes    Quit date: 07/09/2014    Years since quitting: 6.4   Smokeless tobacco: Never   Tobacco comments:    8-10 a day  Vaping Use   Vaping Use: Never used  Substance and Sexual Activity   Alcohol use: Yes    Alcohol/week: 1.0 standard drink    Types: 1 Glasses of wine per week    Comment: socially   Drug use: No   Sexual activity: Yes  Other Topics Concern   Not on file  Social History Narrative   Lives with friend   Right Handed   Drinks 2-3 cups caffeine daily   Social  Determinants of Health   Financial Resource Strain: Not on file  Food Insecurity: Not on file  Transportation Needs: Not on file  Physical Activity: Not on file  Stress: Not on file  Social Connections: Not on file  Intimate Partner Violence: Not on file    Family History:  Family History  Problem Relation Age of Onset   Stroke Maternal Grandmother     Medications:   Current Outpatient Medications on File Prior to Visit  Medication Sig Dispense Refill   AMOXICILLIN PO Take 500 mg by mouth daily as needed (dental work). For dental work     Apoaequorin (PREVAGEN) 10 MG CAPS Take 1 capsule by mouth daily.     atorvastatin (LIPITOR) 20 MG tablet Take 1 tablet (20 mg total) by mouth daily at 6 PM. 30 tablet 0   Calcium Carb-Cholecalciferol (CALCIUM 600 + D PO) Take 1 tablet  by mouth daily.      clopidogrel (PLAVIX) 75 MG tablet Take 1 tablet (75 mg total) by mouth daily. 30 tablet 11   magnesium oxide (MAG-OX) 400 MG tablet Take 400 mg by mouth daily.     metFORMIN (GLUCOPHAGE-XR) 500 MG 24 hr tablet Take 1 tablet (500 mg total) by mouth 2 (two) times daily.     Multiple Vitamins-Minerals (PRESERVISION AREDS PO) Take by mouth 2 (two) times daily.     polyethylene glycol (MIRALAX / GLYCOLAX) 17 g packet Take 17 g by mouth daily as needed for mild constipation.     sildenafil (REVATIO) 20 MG tablet Take 40-100 mg by mouth daily as needed (erectile dysfunction).     valsartan (DIOVAN) 80 MG tablet Take 1 tablet (80 mg total) by mouth daily.     vitamin B-12 (CYANOCOBALAMIN) 500 MCG tablet Take 500 mcg by mouth daily.     No current facility-administered medications on file prior to visit.    Allergies:   Allergies  Allergen Reactions   Brilinta [Ticagrelor] Shortness Of Breath   Iodine Hives and Other (See Comments)    Internal only      OBJECTIVE:  Physical Exam  Vitals:   12/26/20 1409  BP: 126/66  Pulse: 70  Weight: 175 lb 8 oz (79.6 kg)  Height: 5\' 7"  (1.702 m)    Body mass index is 27.49 kg/m. No results found.  General: well developed, well nourished, very pleasant elderly Caucasian male, seated, in no evident distress Head: head normocephalic and atraumatic.   Neck: supple with no carotid or supraclavicular bruits Cardiovascular: regular rate and rhythm, no murmurs Musculoskeletal: no deformity Skin:  no rash/petichiae Vascular:  Normal pulses all extremities   Neurologic Exam Mental Status: Awake and fully alert.   Fluent speech and language.  Oriented to place and time. Recent and remote memory intact. Attention span, concentration and fund of knowledge appropriate. Mood and affect appropriate.  Cranial Nerves: Pupils equal, briskly reactive to light. Extraocular movements full without nystagmus. Visual fields full to confrontation. Hearing intact. Facial sensation intact. Face, tongue, palate moves normally and symmetrically.  Motor: Normal bulk and tone. Normal strength in all tested extremity muscles Sensory.: intact to touch , pinprick , position and vibratory sensation.  Coordination: Rapid alternating movements normal in all extremities. Finger-to-nose and heel-to-shin performed accurately bilaterally. Gait and Station: Arises from chair without difficulty. Stance is normal. Gait demonstrates normal stride length and balance without use of assistive device. Tandem walk and heel toe with mild difficulty.  Reflexes: 1+ and symmetric. Toes downgoing.     MMSE - Mini Mental State Exam 12/26/2020 03/21/2019  Orientation to time 5 5  Orientation to Place 5 5  Registration 3 3  Attention/ Calculation 5 5  Recall 2 3  Language- name 2 objects 2 2  Language- repeat 1 1  Language- follow 3 step command 3 3  Language- read & follow direction 1 1  Write a sentence 1 1  Copy design 1 1  Copy design-comments 4 legged animals x 1 min: 10 -  Total score 29 30         ASSESSMENT: JAMEISON HAJI is a 80 y.o. year old male presented with  episode of possible leg weakness and altered cognition on 06/03/2020 possibly in setting of posterior circulation TIA vs seizure with postictal confusion. Vascular risk factors include HTN, HLD, former tobacco use and history of prior stroke.  Interval new episode of left face and  hand numbness on 09/11/2020 likely right hemispheric subcortical TIA from small vessel disease.     PLAN:  I had a long d/w patient and his friend Timothy Brewer about his recent stroke, risk for recurrent stroke/TIAs, personally independently reviewed imaging studies and stroke evaluation results and answered questions.Continue clopidogrel 75 mg daily  for secondary stroke prevention and maintain strict control of hypertension with blood pressure goal below 130/90, diabetes with hemoglobin A1c goal below 6.5% and lipids with LDL cholesterol goal below 70 mg/dL. I also advised the patient to eat a healthy diet with plenty of whole grains, cereals, fruits and vegetables, exercise regularly and maintain ideal body weight .patient has a family history of Alzheimer's and is worried about developing it but did quite well on cognitive testing today in the office.  I encouraged him to increase participation in cognitively challenging activities like solving crossword puzzles, playing bridge and sodoku.  We also discussed memory compensation strategies.  He will return for follow-up in the future in 6 months or call earlier if necessary. I  spent approximately 40 minutes with face-to-face and non-face-to-face with the patient and wife.  We discussed recent hospitalization possibly in setting of TIA vs seizure type activity, importance of managing stroke risk factors and recommended goals for stroke prevention, continued use of current medications, hx of MCI and risk of developing Alzheimer's and answering questions.  Antony Contras, MD  [facility] [address] [place], [place] [address]  Phone [phone] Fax  [phone] Note: This document was prepared with digital dictation and possible smart phrase technology. Any transcriptional errors that result from this process are unintentional.

## 2020-12-26 NOTE — Patient Instructions (Signed)
I had a long d/w patient about his recent stroke, risk for recurrent stroke/TIAs, personally independently reviewed imaging studies and stroke evaluation results and answered questions.Continue clopidogrel 75 mg daily  for secondary stroke prevention and maintain strict control of hypertension with blood pressure goal below 130/90, diabetes with hemoglobin A1c goal below 6.5% and lipids with LDL cholesterol goal below 70 mg/dL. I also advised the patient to eat a healthy diet with plenty of whole grains, cereals, fruits and vegetables, exercise regularly and maintain ideal body weight .I encouraged him to increase participation in cognitively challenging activities like solving crossword puzzles, playing bridge and sodoku.  We also discussed memory compensation strategies.  He will return for follow-up in the future in 6 months or call earlier if necessary. Memory Compensation Strategies  Use "WARM" strategy.  W= write it down  A= associate it  R= repeat it  M= make a mental note  2.   You can keep a Social worker.  Use a 3-ring notebook with sections for the following: calendar, important names and phone numbers,  medications, doctors' names/phone numbers, lists/reminders, and a section to journal what you did  each day.   3.    Use a calendar to write appointments down.  4.    Write yourself a schedule for the day.  This can be placed on the calendar or in a separate section of the Memory Notebook.  Keeping a  regular schedule can help memory.  5.    Use medication organizer with sections for each day or morning/evening pills.  You may need help loading it  6.    Keep a basket, or pegboard by the door.  Place items that you need to take out with you in the basket or on the pegboard.  You may also want to  include a message board for reminders.  7.    Use sticky notes.  Place sticky notes with reminders in a place where the task is performed.  For example: " turn off the  stove" placed by  the stove, "lock the door" placed on the door at eye level, " take your medications" on  the bathroom mirror or by the place where you normally take your medications.  8.    Use alarms/timers.  Use while cooking to remind yourself to check on food or as a reminder to take your medicine, or as a  reminder to make a call, or as a reminder to perform another task, etc.

## 2021-01-10 DIAGNOSIS — Z6827 Body mass index (BMI) 27.0-27.9, adult: Secondary | ICD-10-CM | POA: Diagnosis not present

## 2021-01-10 DIAGNOSIS — E663 Overweight: Secondary | ICD-10-CM | POA: Diagnosis not present

## 2021-01-10 DIAGNOSIS — M130 Polyarthritis, unspecified: Secondary | ICD-10-CM | POA: Diagnosis not present

## 2021-01-17 DIAGNOSIS — J439 Emphysema, unspecified: Secondary | ICD-10-CM | POA: Diagnosis not present

## 2021-01-17 DIAGNOSIS — E119 Type 2 diabetes mellitus without complications: Secondary | ICD-10-CM | POA: Diagnosis not present

## 2021-01-17 DIAGNOSIS — E782 Mixed hyperlipidemia: Secondary | ICD-10-CM | POA: Diagnosis not present

## 2021-01-17 DIAGNOSIS — E1169 Type 2 diabetes mellitus with other specified complication: Secondary | ICD-10-CM | POA: Diagnosis not present

## 2021-01-17 DIAGNOSIS — M8580 Other specified disorders of bone density and structure, unspecified site: Secondary | ICD-10-CM | POA: Diagnosis not present

## 2021-01-17 DIAGNOSIS — I10 Essential (primary) hypertension: Secondary | ICD-10-CM | POA: Diagnosis not present

## 2021-03-16 DIAGNOSIS — U071 COVID-19: Secondary | ICD-10-CM | POA: Diagnosis not present

## 2021-03-22 DIAGNOSIS — Z09 Encounter for follow-up examination after completed treatment for conditions other than malignant neoplasm: Secondary | ICD-10-CM | POA: Diagnosis not present

## 2021-03-22 DIAGNOSIS — Z8616 Personal history of COVID-19: Secondary | ICD-10-CM | POA: Diagnosis not present

## 2021-03-26 DIAGNOSIS — Z8673 Personal history of transient ischemic attack (TIA), and cerebral infarction without residual deficits: Secondary | ICD-10-CM | POA: Diagnosis not present

## 2021-03-26 DIAGNOSIS — R413 Other amnesia: Secondary | ICD-10-CM | POA: Diagnosis not present

## 2021-03-26 DIAGNOSIS — L309 Dermatitis, unspecified: Secondary | ICD-10-CM | POA: Diagnosis not present

## 2021-03-26 DIAGNOSIS — I10 Essential (primary) hypertension: Secondary | ICD-10-CM | POA: Diagnosis not present

## 2021-03-26 DIAGNOSIS — J439 Emphysema, unspecified: Secondary | ICD-10-CM | POA: Diagnosis not present

## 2021-03-26 DIAGNOSIS — E782 Mixed hyperlipidemia: Secondary | ICD-10-CM | POA: Diagnosis not present

## 2021-03-26 DIAGNOSIS — J309 Allergic rhinitis, unspecified: Secondary | ICD-10-CM | POA: Diagnosis not present

## 2021-03-26 DIAGNOSIS — N529 Male erectile dysfunction, unspecified: Secondary | ICD-10-CM | POA: Diagnosis not present

## 2021-03-26 DIAGNOSIS — I6930 Unspecified sequelae of cerebral infarction: Secondary | ICD-10-CM | POA: Diagnosis not present

## 2021-03-26 DIAGNOSIS — E1169 Type 2 diabetes mellitus with other specified complication: Secondary | ICD-10-CM | POA: Diagnosis not present

## 2021-03-26 DIAGNOSIS — Z7984 Long term (current) use of oral hypoglycemic drugs: Secondary | ICD-10-CM | POA: Diagnosis not present

## 2021-05-10 DIAGNOSIS — Z7984 Long term (current) use of oral hypoglycemic drugs: Secondary | ICD-10-CM | POA: Diagnosis not present

## 2021-05-10 DIAGNOSIS — E119 Type 2 diabetes mellitus without complications: Secondary | ICD-10-CM | POA: Diagnosis not present

## 2021-05-10 DIAGNOSIS — I10 Essential (primary) hypertension: Secondary | ICD-10-CM | POA: Diagnosis not present

## 2021-06-11 DIAGNOSIS — H35371 Puckering of macula, right eye: Secondary | ICD-10-CM | POA: Diagnosis not present

## 2021-06-11 DIAGNOSIS — H353122 Nonexudative age-related macular degeneration, left eye, intermediate dry stage: Secondary | ICD-10-CM | POA: Diagnosis not present

## 2021-06-11 DIAGNOSIS — H353112 Nonexudative age-related macular degeneration, right eye, intermediate dry stage: Secondary | ICD-10-CM | POA: Diagnosis not present

## 2021-06-11 DIAGNOSIS — E119 Type 2 diabetes mellitus without complications: Secondary | ICD-10-CM | POA: Diagnosis not present

## 2021-06-27 ENCOUNTER — Ambulatory Visit: Payer: PPO | Admitting: Neurology

## 2021-06-27 ENCOUNTER — Encounter: Payer: Self-pay | Admitting: Neurology

## 2021-06-27 VITALS — BP 148/66 | HR 66 | Ht 67.0 in | Wt 165.0 lb

## 2021-06-27 DIAGNOSIS — I6523 Occlusion and stenosis of bilateral carotid arteries: Secondary | ICD-10-CM | POA: Diagnosis not present

## 2021-06-27 NOTE — Patient Instructions (Signed)
I had a long d/w patient about his remote TIA,, risk for recurrent stroke/TIAs, personally independently reviewed imaging studies and stroke evaluation results and answered questions.Continue Plavix (clopidogrel) 75 mg daily  for secondary stroke prevention and maintain strict control of hypertension with blood pressure goal below 130/90, diabetes with hemoglobin A1c goal below 6.5% and lipids with LDL cholesterol goal below 70 mg/dL.  Check screening follow-up carotid ultrasound study.  Patient was advised to increase participation in cognitively challenging activities like solving crossword puzzles, playing bridge and sudoku.  We also discussed memory compensation strategies.  Followup in the future with me in 1 year or call earlier if necessary. ? ?

## 2021-06-27 NOTE — Progress Notes (Signed)
?[facility] ?[address] ?[place]. [place] [address] ?[phone] ? ?     HOSPITAL FOLLOW UP NOTE ? ?[name] ?Date of Birth:  10/27/1940 ?Medical Record Number:  [id number]  ? ?Reason for Referral:  hospital stroke follow up ? ? ? ?SUBJECTIVE: ? ? ?CHIEF COMPLAINT:  ?No chief complaint on file. ? ? ?HPI:  ? ?[name] is a 81 y.o. male with history of HTN, HLD, DM, former tobacco use, and history of prior stroke who presented on 06/03/2020 sudden onset confusion and speech difficulty lasting 25 to 30 minutes and transient right leg weakness.  Personally reviewed hospitalization pertinent progress notes, lab work and imaging with summary provided.  Evaluated by Dr. Leonie Man and likely posterior circulation TIA vs seizure with postictal confusion.  EEG negative.  Recommended aspirin and Brilinta for 30 days then aspirin alone.  LDL 64 on atorvastatin 20 mg daily.  A1c 6.3 on metformin.  Evaluated by therapies and discharged home in stable condition without therapy needs. ? ?Stroke like episode, possible TIA  with negative stroke work up.  ?  ?Code Stroke CT head ?No acute intracranial abnormalities. ?Chronic small vessel ischemic change and brain atrophy. ?MRI   ?No evidence of acute infarction, hemorrhage, or mass. Mild chronic microvascular ischemic changes ?MRA   ?No evidence of acute infarction, hemorrhage, or mass. Mild chronic microvascular ischemic changes. ?Normal MRA head. ?Carotid Doppler: ?Right Carotid: Velocities in the right ICA are consistent with a 1-39% stenosis.  ?Left Carotid: Velocities in the left ICA are consistent with a 1-39% stenosis.  ?Vertebrals: Bilateral vertebral arteries demonstrate antegrade flow.  ?2D Echo: done, with read pending ?EEG read is normal. No seizure activity.  ?LDL 64 ?HgbA1c 6.3 ?Antithrombotic: Not on antithrombotic PTA.  Recommended aspirin 81 mg daily and Brilinta for 4 weeks then aspirin alone ?Therapy recommendations:   none ?Disposition:  Home  ? ?Today, 07/09/2020, Timothy Brewer is being seen for hospital follow-up accompanied by his wife, Verdis Frederickson ? ?Doing well since discharge without any reoccurring or new stroke/TIA symptoms.  He has returned back to all prior activities without difficulty. ? ?Completed 4 weeks of Brilinta and remains on aspirin 81 mg daily without associated side effects ?He does report symptoms of shortness of breath while on Brilinta that resolved upon discontinuing ?Remains on atorvastatin 20 mg daily without associated side effects ?Blood pressure today 126/68 -routinely monitors at home and typically 120s/60s ? ?Routinely followed by Dr. Leonie Man for [place] with prior visit 12/2019 and scheduled follow-up visit 12/2020 ? ?No further concerns at this time ? ?Update 12/26/2020 : He returns for follow-up after last visit on 07/09/2020 with  [facility] nurse practitioner.  He is accompanied by his friend Lelan Pons.  Patient had another episode of TIA on 09/11/2020.  He developed sudden onset of left face and hand numbness lasted about 20 minutes.  He also noticed that he was off balance.  Symptoms resolved by the time days to hospital.  MRI scan of the brain was obtained which I personally reviewed showed no acute abnormality.  Showed only changes of chronic small vessel disease and mild generalized atrophy.  2D echo showed normal ejection fraction of 55 to 60% without cardiac source of embolism.  Transcranial Doppler study on 09/18/2020 was normal.  LDL cholesterol 72 mg percent.  Hemoglobin A1c was 6.0.  Carotid ultrasound showed no significant extracranial stenosis.  Patient was changed from aspirin to Plavix which is tolerating well without bruising or bleeding.  He has had no recurrent TIA or stroke symptoms since then.  He states his blood pressure is under good control and today it is 126/66.  He states his sugars are also under good control.  He is tolerating Lipitor well without any side effects.  Patient can continues to  complain of mild short-term memory difficulties but these are mostly unchanged.  He is worried about developing Alzheimer's since his sister died of it.  On Mini-Mental status exam today scored 29/30 with only 1 deficit in recall.  He still mostly independent in activities of daily living. ?ROS:   ?14 system review of systems performed and negative with exception of no complaints ? ?PMH:  ?Past Medical History:  ?Diagnosis Date  ? Arthritis   ? Diabetes mellitus without complication ([place])   ? Hypertension   ? Memory loss   ? Pre-diabetes   ? on metformin  ? Stroke [facility])   ? ? ?PSH:  ?Past Surgical History:  ?Procedure Laterality Date  ? BACK SURGERY    ? c3,c4,c5,c6 ,l4,l5  ? EYE SURGERY    ? bil cataracts  ? NASAL SEPTUM SURGERY    ? TONSILLECTOMY    ? 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 Procedure: LEFT TOTAL HIP ARTHROPLASTY ANTERIOR APPROACH;  Surgeon: Paralee Cancel, MD;  Location: [facility] ORS;  Service: Orthopedics;  Laterality: Left;  70 mins  ? ? ?Social History:  ?Social History  ? ?Socioeconomic History  ? Marital status: Widowed  ?  Spouse name: Not on file  ? Number of children: Not on file  ? Years of education: some college  ? Highest education level: Not on file  ?Occupational History  ? Occupation: retired  ?Tobacco Use  ? Smoking status: Former  ?  Types: Cigarettes  ?  Quit date: 07/09/2014  ?  Years since quitting: 6.9  ? Smokeless tobacco: Never  ? Tobacco comments:  ?  8-10 a day  ?Vaping Use  ? Vaping Use: Never used  ?Substance and Sexual Activity  ? Alcohol use: Yes  ?  Alcohol/week: 1.0 standard drink  ?  Types: 1 Glasses of wine per week  ?  Comment: socially  ? Drug use: No  ? Sexual activity: Yes  ?Other Topics Concern  ? Not on file  ?Social History Narrative  ? Lives with friend  ? Right Handed  ? Drinks 2-3 cups caffeine daily  ? ?Social Determinants of Health  ? ?Financial Resource Strain: Not on file  ?Food Insecurity: Not on file  ?Transportation Needs: Not on file  ?Physical Activity:  Not on file  ?Stress: Not on file  ?Social Connections: Not on file  ?Intimate Partner Violence: Not on file  ? ? ?Family History:  ?Family History  ?Problem Relation Age of Onset  ? Stroke Maternal Grandmother   ? ? ?Medications:   ?Current Outpatient Medications on File Prior to Visit  ?Medication Sig Dispense Refill  ? Apoaequorin (PREVAGEN) 10 MG CAPS Take 1 capsule by mouth daily.    ? atorvastatin (LIPITOR) 20 MG tablet Take 1 tablet (20 mg total) by mouth daily at 6 PM. 30 tablet 0  ? Calcium Carb-Cholecalciferol (CALCIUM 600 + D PO) Take 1 tablet by mouth daily.     ? clopidogrel (PLAVIX) 75 MG tablet Take 1 tablet (75 mg total) by mouth daily. 30 tablet 11  ? magnesium oxide (MAG-OX) 400 MG tablet Take 400 mg by mouth daily.    ? metFORMIN (GLUCOPHAGE-XR) 500 MG 24  hr tablet Take 1 tablet (500 mg total) by mouth 2 (two) times daily.    ? Multiple Vitamins-Minerals (PRESERVISION AREDS PO) Take by mouth 2 (two) times daily.    ? polyethylene glycol (MIRALAX / GLYCOLAX) 17 g packet Take 17 g by mouth daily as needed for mild constipation.    ? sildenafil (REVATIO) 20 MG tablet Take 40-100 mg by mouth daily as needed (erectile dysfunction).    ? valsartan (DIOVAN) 80 MG tablet Take 1 tablet (80 mg total) by mouth daily.    ? vitamin B-12 (CYANOCOBALAMIN) 500 MCG tablet Take 500 mcg by mouth daily.    ? ?No current facility-administered medications on file prior to visit.  ? ? ?Allergies:   ?Allergies  ?Allergen Reactions  ? Brilinta [Ticagrelor] Shortness Of Breath  ? Iodine Hives and Other (See Comments)  ?  Internal only  ? ? ? ? ?OBJECTIVE: ? ?Physical Exam ? ?Vitals:  ? 06/27/21 1349  ?BP: (!) 148/66  ?Pulse: 66  ?Weight: 165 lb (74.8 kg)  ?Height: '5\' 7"'$  (1.702 m)  ? ?Body mass index is 25.84 kg/m?Marland Kitchen ?No results found. ? ?General: well developed, well nourished, very pleasant elderly Caucasian male, seated, in no evident distress ?Head: head normocephalic and atraumatic.   ?Neck: supple with no carotid or  supraclavicular bruits ?Cardiovascular: regular rate and rhythm, no murmurs ?Musculoskeletal: no deformity ?Skin:  no rash/petichiae ?Vascular:  Normal pulses all extremities ?  ?Neurologic Exam ?Mental Status:

## 2021-07-02 ENCOUNTER — Telehealth: Payer: Self-pay | Admitting: Neurology

## 2021-07-02 NOTE — Telephone Encounter (Signed)
Health team no auth req- sent a message to Butch Penny. She will reach out to the patient to schedule.  ?

## 2021-07-05 ENCOUNTER — Ambulatory Visit (HOSPITAL_COMMUNITY)
Admission: RE | Admit: 2021-07-05 | Discharge: 2021-07-05 | Disposition: A | Discharge status: Home / Self Care | Payer: PPO | Source: Ambulatory Visit | Attending: Neurology | Admitting: Neurology

## 2021-07-05 DIAGNOSIS — I6523 Occlusion and stenosis of bilateral carotid arteries: Secondary | ICD-10-CM | POA: Diagnosis not present

## 2021-07-05 NOTE — Progress Notes (Signed)
Carotid duplex has been completed.  ? ?Preliminary results in CV Proc.  ? ?Mechele Kittleson Taiten Brawn ?07/05/2021 10:31 AM    ?

## 2021-07-10 DIAGNOSIS — H60502 Unspecified acute noninfective otitis externa, left ear: Secondary | ICD-10-CM | POA: Diagnosis not present

## 2021-07-17 DIAGNOSIS — H353122 Nonexudative age-related macular degeneration, left eye, intermediate dry stage: Secondary | ICD-10-CM | POA: Diagnosis not present

## 2021-07-17 DIAGNOSIS — H353112 Nonexudative age-related macular degeneration, right eye, intermediate dry stage: Secondary | ICD-10-CM | POA: Diagnosis not present

## 2021-07-17 DIAGNOSIS — E119 Type 2 diabetes mellitus without complications: Secondary | ICD-10-CM | POA: Diagnosis not present

## 2021-07-20 ENCOUNTER — Other Ambulatory Visit: Payer: Self-pay | Admitting: Neurology

## 2021-07-22 NOTE — Telephone Encounter (Signed)
Rx refilled.

## 2021-09-16 DIAGNOSIS — I10 Essential (primary) hypertension: Secondary | ICD-10-CM | POA: Diagnosis not present

## 2021-09-16 DIAGNOSIS — E782 Mixed hyperlipidemia: Secondary | ICD-10-CM | POA: Diagnosis not present

## 2021-09-16 DIAGNOSIS — E1169 Type 2 diabetes mellitus with other specified complication: Secondary | ICD-10-CM | POA: Diagnosis not present

## 2021-10-15 DIAGNOSIS — M62838 Other muscle spasm: Secondary | ICD-10-CM | POA: Diagnosis not present

## 2021-10-15 DIAGNOSIS — S134XXA Sprain of ligaments of cervical spine, initial encounter: Secondary | ICD-10-CM | POA: Diagnosis not present

## 2021-10-24 DIAGNOSIS — N529 Male erectile dysfunction, unspecified: Secondary | ICD-10-CM | POA: Diagnosis not present

## 2021-10-24 DIAGNOSIS — I10 Essential (primary) hypertension: Secondary | ICD-10-CM | POA: Diagnosis not present

## 2021-10-24 DIAGNOSIS — Z0000 Encounter for general adult medical examination without abnormal findings: Secondary | ICD-10-CM | POA: Diagnosis not present

## 2021-10-24 DIAGNOSIS — Z8739 Personal history of other diseases of the musculoskeletal system and connective tissue: Secondary | ICD-10-CM | POA: Diagnosis not present

## 2021-10-24 DIAGNOSIS — Z8673 Personal history of transient ischemic attack (TIA), and cerebral infarction without residual deficits: Secondary | ICD-10-CM | POA: Diagnosis not present

## 2021-10-24 DIAGNOSIS — Z7984 Long term (current) use of oral hypoglycemic drugs: Secondary | ICD-10-CM | POA: Diagnosis not present

## 2021-10-24 DIAGNOSIS — E782 Mixed hyperlipidemia: Secondary | ICD-10-CM | POA: Diagnosis not present

## 2021-10-24 DIAGNOSIS — Z1331 Encounter for screening for depression: Secondary | ICD-10-CM | POA: Diagnosis not present

## 2021-10-24 DIAGNOSIS — J439 Emphysema, unspecified: Secondary | ICD-10-CM | POA: Diagnosis not present

## 2021-10-24 DIAGNOSIS — Z1211 Encounter for screening for malignant neoplasm of colon: Secondary | ICD-10-CM | POA: Diagnosis not present

## 2021-10-24 DIAGNOSIS — I6930 Unspecified sequelae of cerebral infarction: Secondary | ICD-10-CM | POA: Diagnosis not present

## 2021-10-24 DIAGNOSIS — E1169 Type 2 diabetes mellitus with other specified complication: Secondary | ICD-10-CM | POA: Diagnosis not present

## 2021-11-04 DIAGNOSIS — I10 Essential (primary) hypertension: Secondary | ICD-10-CM | POA: Diagnosis not present

## 2021-11-04 DIAGNOSIS — Z7984 Long term (current) use of oral hypoglycemic drugs: Secondary | ICD-10-CM | POA: Diagnosis not present

## 2021-11-04 DIAGNOSIS — E119 Type 2 diabetes mellitus without complications: Secondary | ICD-10-CM | POA: Diagnosis not present

## 2021-11-21 DIAGNOSIS — M7542 Impingement syndrome of left shoulder: Secondary | ICD-10-CM | POA: Diagnosis not present

## 2021-11-21 DIAGNOSIS — M542 Cervicalgia: Secondary | ICD-10-CM | POA: Diagnosis not present

## 2021-11-21 DIAGNOSIS — M47812 Spondylosis without myelopathy or radiculopathy, cervical region: Secondary | ICD-10-CM | POA: Diagnosis not present

## 2021-11-21 DIAGNOSIS — M25512 Pain in left shoulder: Secondary | ICD-10-CM | POA: Diagnosis not present

## 2022-03-30 ENCOUNTER — Emergency Department (HOSPITAL_COMMUNITY)
Admission: EM | Admit: 2022-03-30 | Discharge: 2022-03-31 | Disposition: A | Discharge status: Home / Self Care | Payer: PPO | Attending: Emergency Medicine | Admitting: Emergency Medicine

## 2022-03-30 ENCOUNTER — Other Ambulatory Visit: Payer: Self-pay

## 2022-03-30 DIAGNOSIS — R1111 Vomiting without nausea: Secondary | ICD-10-CM | POA: Diagnosis not present

## 2022-03-30 DIAGNOSIS — Z7984 Long term (current) use of oral hypoglycemic drugs: Secondary | ICD-10-CM | POA: Diagnosis not present

## 2022-03-30 DIAGNOSIS — R748 Abnormal levels of other serum enzymes: Secondary | ICD-10-CM | POA: Diagnosis not present

## 2022-03-30 DIAGNOSIS — I110 Hypertensive heart disease with heart failure: Secondary | ICD-10-CM | POA: Insufficient documentation

## 2022-03-30 DIAGNOSIS — Z7902 Long term (current) use of antithrombotics/antiplatelets: Secondary | ICD-10-CM | POA: Insufficient documentation

## 2022-03-30 DIAGNOSIS — I2081 Angina pectoris with coronary microvascular dysfunction: Secondary | ICD-10-CM | POA: Diagnosis not present

## 2022-03-30 DIAGNOSIS — I209 Angina pectoris, unspecified: Secondary | ICD-10-CM | POA: Insufficient documentation

## 2022-03-30 DIAGNOSIS — D72829 Elevated white blood cell count, unspecified: Secondary | ICD-10-CM | POA: Diagnosis not present

## 2022-03-30 DIAGNOSIS — R7401 Elevation of levels of liver transaminase levels: Secondary | ICD-10-CM | POA: Insufficient documentation

## 2022-03-30 DIAGNOSIS — E119 Type 2 diabetes mellitus without complications: Secondary | ICD-10-CM | POA: Insufficient documentation

## 2022-03-30 DIAGNOSIS — I509 Heart failure, unspecified: Secondary | ICD-10-CM | POA: Diagnosis not present

## 2022-03-30 DIAGNOSIS — Z79899 Other long term (current) drug therapy: Secondary | ICD-10-CM | POA: Diagnosis not present

## 2022-03-30 DIAGNOSIS — R079 Chest pain, unspecified: Secondary | ICD-10-CM | POA: Diagnosis not present

## 2022-03-30 DIAGNOSIS — R110 Nausea: Secondary | ICD-10-CM | POA: Diagnosis not present

## 2022-03-30 DIAGNOSIS — R1084 Generalized abdominal pain: Secondary | ICD-10-CM | POA: Diagnosis not present

## 2022-03-30 DIAGNOSIS — I2089 Other forms of angina pectoris: Secondary | ICD-10-CM

## 2022-03-30 DIAGNOSIS — R0789 Other chest pain: Secondary | ICD-10-CM | POA: Diagnosis not present

## 2022-03-30 DIAGNOSIS — J9811 Atelectasis: Secondary | ICD-10-CM | POA: Diagnosis not present

## 2022-03-30 NOTE — ED Triage Notes (Signed)
Presents from home for sudden onset dull pressure in epigastric into his chest. No previous experiences.   H/o CVA without deficits.   EMS : 12 lead unremarkable. NSR 84. CBG 110, RR 14   Took ASA prior to EMS arrival. Nitro given x1, BP dropped from 211 to 155 systolic and led to nausea (zofran '4mg'$  given en route). Now 0/10 pain.

## 2022-03-30 NOTE — ED Provider Triage Note (Signed)
Emergency Medicine Provider Triage Evaluation Note  Timothy Brewer , a 82 y.o. male  was evaluated in triage.  Pt complains of chest discomfort.  Patient has history of previous CVA. chest discomfort began at at 2100 while patient was at rest.  Patient states the discomfort was all throughout his chest and did not radiate.  Patient then proceeded to call 911 and after receiving 1 dose of nitro the chest discomfort resolved.  Patient denied any abdominal pain, back pain, radiation to left shoulder/left jaw, syncope, recent illnesses, emesis, changes in sensation/motor skills  Review of Systems  Positive: See HPI Negative: See HPI  Physical Exam  BP (!) 156/71 (BP Location: Right Arm)   Pulse 80   Temp 97.9 F (36.6 C)   Resp 17   SpO2 94%  Gen:   Awake, no distress  Resp:  Normal effort  CV:  RRR MSK:   Moves extremities without difficulty  Abd:  Non TTP  Medical Decision Making  Medically screening exam initiated at 11:49 PM.  Appropriate orders placed.  WASIL WOLKE was informed that the remainder of the evaluation will be completed by another provider, this initial triage assessment does not replace that evaluation, and the importance of remaining in the ED until their evaluation is complete.    Chuck Hint, PA-C 03/30/22 2353

## 2022-03-31 ENCOUNTER — Emergency Department (HOSPITAL_COMMUNITY): Payer: PPO

## 2022-03-31 ENCOUNTER — Encounter (HOSPITAL_COMMUNITY): Payer: Self-pay | Admitting: Emergency Medicine

## 2022-03-31 DIAGNOSIS — J9811 Atelectasis: Secondary | ICD-10-CM | POA: Diagnosis not present

## 2022-03-31 DIAGNOSIS — R0789 Other chest pain: Secondary | ICD-10-CM | POA: Diagnosis not present

## 2022-03-31 LAB — BASIC METABOLIC PANEL
Anion gap: 11 (ref 5–15)
BUN: 20 mg/dL (ref 8–23)
CO2: 26 mmol/L (ref 22–32)
Calcium: 9.5 mg/dL (ref 8.9–10.3)
Chloride: 101 mmol/L (ref 98–111)
Creatinine, Ser: 1.06 mg/dL (ref 0.61–1.24)
GFR, Estimated: >60 mL/min (ref >60)
Glucose, Bld: 111 mg/dL — ABNORMAL HIGH (ref 70–99)
Potassium: 4 mmol/L (ref 3.5–5.1)
Sodium: 138 mmol/L (ref 135–145)

## 2022-03-31 LAB — LIPASE, BLOOD: Lipase: 37 U/L (ref 11–51)

## 2022-03-31 LAB — HEPATIC FUNCTION PANEL
ALT: 160 U/L — ABNORMAL HIGH (ref 0–44)
AST: 292 U/L — ABNORMAL HIGH (ref 15–41)
Albumin: 3.7 g/dL (ref 3.5–5.0)
Alkaline Phosphatase: 114 U/L (ref 38–126)
Bilirubin, Direct: 0.5 mg/dL — ABNORMAL HIGH (ref 0.0–0.2)
Indirect Bilirubin: 0.5 mg/dL (ref 0.3–0.9)
Total Bilirubin: 1 mg/dL (ref 0.3–1.2)
Total Protein: 7 g/dL (ref 6.5–8.1)

## 2022-03-31 LAB — CBC
HCT: 41.3 % (ref 39.0–52.0)
Hemoglobin: 13.8 g/dL (ref 13.0–17.0)
MCH: 32.2 pg (ref 26.0–34.0)
MCHC: 33.4 g/dL (ref 30.0–36.0)
MCV: 96.3 fL (ref 80.0–100.0)
Platelets: 347 10*3/uL (ref 150–400)
RBC: 4.29 MIL/uL (ref 4.22–5.81)
RDW: 13.2 % (ref 11.5–15.5)
WBC: 11.9 10*3/uL — ABNORMAL HIGH (ref 4.0–10.5)
nRBC: 0 % (ref 0.0–0.2)

## 2022-03-31 LAB — TROPONIN I (HIGH SENSITIVITY)
Troponin I (High Sensitivity): 5 ng/L (ref <18)
Troponin I (High Sensitivity): 7 ng/L (ref <18)

## 2022-03-31 NOTE — Discharge Instructions (Addendum)
Please follow up with your PCP regarding recent ER visit and elevated liver enzymes. Please try to cut back on alcohol until you are able to see your PCP. I have made a referral to Cardiology ([facility]) and they will contact you about an appointment. If they do not contact you by the end of the week please call them with the number that is attached. If symptoms worsen, please return to ER.

## 2022-03-31 NOTE — ED Provider Notes (Signed)
Timothy Brewer Provider Note   CSN: [id number] Arrival date & time: 03/30/22  2343     History  Chief Complaint  Patient presents with   Chest Pain    Timothy Brewer is a 82 y.o. male, h/o CVA, CHF, DM2, presented with CP that began at 2100 last night. Chest pain began at rest and patient described it as more "discomfort" that went throughout his chest  and originated near the epigastric area but did not radiate outward. Patient called EMS and was given one does of nitro when alleviated symptoms but did cause 1 episode of nonbloody emesis. Patient has remained asympt during ED stay. Patient does not see a cardiologist.  Patient denied shortness of breath, N/V/D, recent illness, changes in sensation or motor skills, dysuria, sick contacts, fevers, syncope  Home Medications Prior to Admission medications   Medication Sig Start Date End Date Taking? Authorizing Provider  Apoaequorin (PREVAGEN) 10 MG CAPS Take 1 capsule by mouth daily.    [provider]  atorvastatin (LIPITOR) 20 MG tablet Take 1 tablet (20 mg total) by mouth daily at 6 PM. 06/08/17   Regalado, Belkys A, MD  Calcium Carb-Cholecalciferol (CALCIUM 600 + D PO) Take 1 tablet by mouth daily.     [provider]  clopidogrel (PLAVIX) 75 MG tablet TAKE 1 TABLET BY MOUTH EVERY DAY 07/22/21   Garvin Fila, MD  magnesium oxide (MAG-OX) 400 MG tablet Take 400 mg by mouth daily.    [provider]  metFORMIN (GLUCOPHAGE-XR) 500 MG 24 hr tablet Take 1 tablet (500 mg total) by mouth 2 (two) times daily. 06/05/20   Geradine Girt, DO  Multiple Vitamins-Minerals (PRESERVISION AREDS PO) Take by mouth 2 (two) times daily.    [provider]  polyethylene glycol (MIRALAX / GLYCOLAX) 17 g packet Take 17 g by mouth daily as needed for mild constipation. 09/12/20   Mercy Riding, MD  sildenafil (REVATIO) 20 MG tablet Take 40-100 mg by mouth daily as needed (erectile  dysfunction). 01/19/19   [provider]  valsartan (DIOVAN) 80 MG tablet Take 1 tablet (80 mg total) by mouth daily. 06/06/20   Geradine Girt, DO  vitamin B-12 (CYANOCOBALAMIN) 500 MCG tablet Take 500 mcg by mouth daily.    [provider]      Allergies    Brilinta [ticagrelor] and Iodine    Review of Systems   Review of Systems  Cardiovascular:  Positive for chest pain.  See HPI  Physical Exam Updated Vital Signs BP (!) 150/64   Pulse (!) 106   Temp 98 F (36.7 C) (Oral)   Resp (!) 26   Wt 74.8 kg   SpO2 91%   BMI 25.84 kg/m  Physical Exam Vitals and nursing note reviewed.  Constitutional:      General: He is not in acute distress.    Appearance: He is well-developed.  HENT:     Head: Normocephalic and atraumatic.  Eyes:     Extraocular Movements: Extraocular movements intact.     Conjunctiva/sclera: Conjunctivae normal.     Pupils: Pupils are equal, round, and reactive to light.  Cardiovascular:     Rate and Rhythm: Normal rate and regular rhythm.     Heart sounds: No murmur heard. Pulmonary:     Effort: Pulmonary effort is normal. No respiratory distress.     Breath sounds: Normal breath sounds.  Abdominal:     Palpations: Abdomen  is soft.     Tenderness: There is no abdominal tenderness. There is no guarding or rebound.  Musculoskeletal:        General: No swelling.     Cervical back: Normal range of motion.  Skin:    General: Skin is warm and dry.     Capillary Refill: Capillary refill takes less than 2 seconds.  Neurological:     General: No focal deficit present.     Mental Status: He is alert and oriented to person, place, and time.  Psychiatric:        Mood and Affect: Mood normal.     ED Results / Procedures / Treatments   Labs (all labs ordered are listed, but only abnormal results are displayed) Labs Reviewed  BASIC METABOLIC PANEL - Abnormal; Notable for the following components:      Result Value   Glucose, Bld 111 (*)     All other components within normal limits  CBC - Abnormal; Notable for the following components:   WBC 11.9 (*)    All other components within normal limits  HEPATIC FUNCTION PANEL - Abnormal; Notable for the following components:   AST 292 (*)    ALT 160 (*)    Bilirubin, Direct 0.5 (*)    All other components within normal limits  LIPASE, BLOOD  TROPONIN I (HIGH SENSITIVITY)  TROPONIN I (HIGH SENSITIVITY)    EKG EKG Interpretation  Date/Time:  Sunday March 30 2022 23:32:16 EST Ventricular Rate:  74 PR Interval:  150 QRS Duration: 102 QT Interval:  396 QTC Calculation: 439 R Axis:   -73 Text Interpretation: Normal sinus rhythm Left axis deviation Cannot rule out Anterior infarct , age undetermined Abnormal ECG When compared with ECG of 11-Sep-2020 17:37, No significant change was found Confirmed by Delora Fuel ([id number]) on 03/31/2022 12:28:37 AM  Radiology DG Chest 2 View  Result Date: 03/31/2022 CLINICAL DATA:  Chest discomfort. EXAM: CHEST - 2 VIEW COMPARISON:  Chest radiograph dated 07/09/2017. FINDINGS: No focal consolidation, pleural effusion or pneumothorax. Bibasilar dependent atelectasis. The cardiac silhouette is within normal limits. No acute osseous pathology. Degenerative changes of the spine. IMPRESSION: No active cardiopulmonary disease. Electronically Signed   By: Anner Crete M.D.   On: 03/31/2022 00:30    Procedures Procedures    Medications Ordered in ED Medications - No data to display  ED Course/ Medical Decision Making/ A&P             HEART Score: 4                Medical Decision Making Amount and/or Complexity of Data Reviewed Labs: ordered. Radiology: ordered.   Timothy Brewer 82 y.o. presented today for CP. Working DDx that I considered at this time includes, but not limited to, ACS, acid reflux, PNA, pancreatitis, hepatitis, MSK.  Review of prior external notes: 10/28/20 ED Provider Notes  Unique Tests and My Interpretation:   BMP: unremarkable CBC: slightly elevated WBC 11.9 Trop: Unremarkable Lipase: Unremarkable Hepatic function Panel: Elevated AST 292, elevated ALT 160 CXR: unremarkable EKG: no acute changes  Discussion with Independent Historian: none  Discussion of Management of Tests: none  Risk: Low:  - based on diagnostic testing/clinical impression and treatment plan  Risk Stratification Score: HEART 4  Staffed with Delora Fuel, MD  R/o DDx: ACS: negative trop and EKG Acid reflux: no N/V and no history PNA: CXR negative MSK: pain not exacerbated by movement and no TTP Pancreatitis: Lipase is unremarkable  Plan: Patient presented with CP. Patient denied any symptoms during ED stay. Patient's vitals have remained normal.  Chest x-ray was unremarkable.  Labs were remarkable for elevated transaminases. Patient had reassuring physical exam. Patient had a HEART score of 4 but 2 of those points were due to age and with the patient's overall presentation and physical exam, he does not inpatient stay. Patient also had a slight white count but was not exhibiting any infectious symptoms to suggest he needs antibiotics. Patient was tachycardic once and that when he was awoken and had blood drawn. At this point, the most likely diagnosis is the patient had an episode of angina that resolved with one dose of nitro. Since the patient does not see a cardiologist I will refer him to one.  I also encouraged the patient to follow-up with his primary care in regards to the elevated liver enzymes.  Patient was given return precautions. Patient is stable at this time for d/c with outpatient follow up. Patient verbalized agreement to this plan.         Final Clinical Impression(s) / ED Diagnoses Final diagnoses:  Stable angina  Elevated liver enzymes    Rx / DC Orders ED Discharge Orders          Ordered    Ambulatory referral to Cardiology       Comments: If you have not heard from the Cardiology  office within the next 72 hours please call [phone].   03/31/22 0410              Chuck Hint, PA-C 49/44/96 [id number]    Delora Fuel, MD 63/84/66 [phone]

## 2022-04-03 NOTE — Progress Notes (Signed)
Cardiology Office Note:   Date:  04/04/2022  NAME:  Timothy Brewer    MRN: [id number] DOB:  May 05, 1940   PCP:  Antony Contras, MD  Cardiologist:  None  Electrophysiologist:  None   Referring MD: Chuck Hint, PA-C   Chief Complaint  Patient presents with   Chest Pain   History of Present Illness:   Timothy Brewer is a 82 y.o. adult with a hx of DM, HTN, HLD, carotid artery disease who is being seen today for the evaluation of chest pain at the request of Antony Contras, MD. Seen in ER for CP. Elevated liver enzymes noted. He went to the emergency room on 03/31/2022.  He tells me that he had just had dinner.  He felt epigastric pain.  He reports the pain lasted several hours and did not resolve.  He then called 911.  He was given nitroglycerin and this made him sick.  He reports it did not improve his symptoms on the first attempt of nitroglycerin.  While in the emergency room, troponins were negative.  EKG was nonischemic.  He was noted to have a transaminitis but no further evaluation.  His AST was 292 and ALT 160.  Of note they have been normal in the past.  He reports he did have wine with dinner.  He does not drink often.  He is not an alcoholic.  His medical history is notable for diabetes.  He has high cholesterol but takes a statin.  He has had no further abdominal discomfort.  He reports no chest pain or trouble breathing but no regular exercise.  He is a former smoker.  He smoked for 40 years 2 packs/day.  His blood pressure is well-controlled.  He does have a history of TIA as well as carotid artery stenosis.  This is followed by neurology.  He is on Plavix.  No strokelike symptoms were reported.  He reports he is married.  He has 2 stepchildren.  He is a retired Geologist, engineering.  His abdominal discomfort has resolved.  He has not seen his primary care physician yet.  His symptoms have improved.  Problem List DM -A1c 6.1 HLD -T chol 161, HDL 71, LDL 75, triglycerides  82 HTN Carotid artery disease -L ICA 40-59% 06/2021 5. 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   atorvastatin (LIPITOR) 20 MG tablet Take 1 tablet (20 mg total) by mouth daily at 6 PM.   Calcium Carb-Cholecalciferol (CALCIUM 600 + D PO) Take 1 tablet by mouth daily.    clopidogrel (PLAVIX) 75 MG tablet TAKE 1 TABLET BY MOUTH EVERY DAY   magnesium oxide (MAG-OX) 400 MG tablet Take 400 mg  by mouth daily.   metFORMIN (GLUCOPHAGE-XR) 500 MG 24 hr tablet Take 1 tablet (500 mg total) by mouth 2 (two) times daily.   Multiple Vitamins-Minerals (PRESERVISION AREDS PO) Take by mouth 2 (two) times daily.   sildenafil (REVATIO) 20 MG tablet Take 40-100 mg by mouth daily as needed (erectile dysfunction).   triamcinolone cream (KENALOG) 0.1 % Apply 1 Application topically as needed.   valsartan (DIOVAN) 40 MG tablet Take 40 mg by mouth daily.   vitamin B-12 (CYANOCOBALAMIN) 500 MCG tablet Take 500 mcg by mouth daily.     Allergies:    Brilinta [ticagrelor] and Iodine   Social History: Social History   Socioeconomic History   Marital status: Married    Spouse name: Not on file   Number of children: 2   Years of education: some college   Highest education level: Not on file  Occupational History   Occupation:  retired   Occupation: Forensic scientist  Tobacco Use   Smoking status: Former    Packs/day: 2.00    Years: 2.00    Total pack years: 4.00    Types: Cigarettes    Quit date: 07/09/2014    Years since quitting: 7.7   Smokeless tobacco: Never   Tobacco comments:    8-10 a day  Vaping Use   Vaping Use: Never used  Substance and Sexual Activity   Alcohol use: Yes    Alcohol/week: 1.0 standard drink of alcohol    Types: 1 Glasses of wine per week    Comment: socially   Drug use: No   Sexual activity: Yes  Other Topics Concern   Not on file  Social History Narrative   Lives with friend   Right Handed   Drinks 2-3 cups caffeine daily   Social Determinants of Health   Financial Resource Strain: Not on file  Food Insecurity: Not on file  Transportation Needs: Not on file  Physical Activity: Not on file  Stress: Not on file  Social Connections: Not on file     Family History: The patient's family history includes Stroke in his maternal grandmother.  ROS:   All other ROS reviewed and negative. Pertinent positives noted in the HPI.     EKGs/Labs/Other Studies Reviewed:   The following studies were personally reviewed by me today:  EKG:  EKG is ordered today.  The ekg ordered today demonstrates normal sinus rhythm heart rate 68, left anterior fascicular block, and was personally reviewed by me.   TTE 09/2020  1. Left ventricular ejection fraction, by estimation, is 55 to 60%. The  left ventricle has normal function. The left ventricle has no regional  wall motion abnormalities. Left ventricular diastolic parameters were  normal.   2. Right ventricular systolic function is normal. The right ventricular  size is normal. Tricuspid regurgitation signal is inadequate for assessing  PA pressure.   3. The mitral valve is grossly normal. Trivial mitral valve  regurgitation. No evidence of mitral stenosis.   4. The aortic valve is grossly normal. Aortic valve regurgitation is not   visualized. No aortic stenosis is present.   5. The inferior vena cava is normal in size with greater than 50%  respiratory variability, suggesting right atrial pressure of 3 mmHg.   6. Agitated saline contrast bubble study was negative, with no evidence  of any interatrial shunt.   Recent Labs: 03/30/2022: BUN 20; Creatinine, Ser 1.06; Hemoglobin 13.8; Platelets 347; Potassium 4.0; Sodium 138 03/31/2022: ALT 160   Recent  Lipid Panel    Component Value Date/Time   CHOL 163 09/12/2020 0500   CHOL 156 10/07/2017 1326   TRIG 83 09/12/2020 0500   HDL 74 09/12/2020 0500   HDL 70 10/07/2017 1326   CHOLHDL 2.2 09/12/2020 0500   VLDL 17 09/12/2020 0500   LDLCALC 72 09/12/2020 0500   LDLCALC 62 10/07/2017 1326    Physical Exam:   VS:  BP 136/64   Pulse 68   Ht '5\' 7"'$  (1.702 m)   Wt 169 lb 3.2 oz (76.7 kg)   SpO2 97%   BMI 26.50 kg/m    Wt Readings from Last 3 Encounters:  04/04/22 169 lb 3.2 oz (76.7 kg)  03/30/22 165 lb (74.8 kg)  06/27/21 165 lb (74.8 kg)    General: Well nourished, well developed, in no acute distress Head: Atraumatic, normal size  Eyes: PEERLA, EOMI  Neck: Supple, no JVD Endocrine: No thryomegaly Cardiac: Normal S1, S2; RRR; no murmurs, rubs, or gallops Lungs: Clear to auscultation bilaterally, no wheezing, rhonchi or rales  Abd: Soft, nontender, no hepatomegaly  Ext: No edema, pulses 2+ Musculoskeletal: No deformities, BUE and BLE strength normal and equal Skin: Warm and dry, no rashes   Neuro: Alert and oriented to person, place, time, and situation, CNII-XII grossly intact, no focal deficits  Psych: Normal mood and affect   ASSESSMENT:   Timothy Brewer is a 82 y.o. adult who presents for the following: 1. Epigastric pain   2. Transaminitis   3. Mixed hyperlipidemia   4. Stenosis of left carotid artery     PLAN:   1. Epigastric pain 2. Transaminitis -He reports having epigastric discomfort and was seen in the emergency room noted to have  elevated liver enzymes.  The story is inconsistent as to whether his symptoms improve with nitroglycerin.  He tells me nitroglycerin made him sick.  He reports no chest pain.  No shortness of breath.  His troponins were negative in the emergency room.  His EKG demonstrates normal sinus rhythm with a left anterior fascicular block.  He had an echocardiogram in 2022 that was normal.  His cardiovascular examination is normal.  To me I suspect his symptoms are noncardiac.  It is a bit concerning that his liver enzymes were elevated.  This could have represented an acute liver injury.  I would recommend follow-up with his primary care physician for further evaluation.  Unclear what has caused this.  In the interim I would recommend to hold any alcohol.  I think it is okay to stay on his statin for now.  He will follow-up with his primary care physician for further evaluation.  Kidney function was normal.  Everything else is within limits.  If there is concern for possible cardiac etiology we will reevaluate him however given his symptoms I believe it is likely more abdominal or gastrointestinal in nature.  3. Mixed hyperlipidemia 4. Stenosis of left carotid artery -History of 40 to 59% left carotid artery disease.  Followed by neurology.  History of TIA.  On Plavix.  On a statin.  He will have repeat lab work done by his primary care physician and workup of his elevated liver enzymes. -Most recent LDL 75 which is close enough to goal given his age.  Disposition: Return if symptoms worsen or fail to improve.  Medication Adjustments/Labs and Tests Ordered: Current medicines are reviewed at length with the patient today.  Concerns regarding medicines are outlined above.  Orders Placed This Encounter  Procedures   EKG 12-Lead   No orders of the defined types were placed in this encounter.   Patient Instructions  Medication Instructions:  The current medical regimen is effective;  continue present plan and  medications.  *If you need a refill on your cardiac medications before your next appointment, please call your pharmacy*   Follow-Up: At [facility], you and your health needs are our priority.  As part of our continuing mission to provide you with exceptional heart care, we have created designated Provider Care Teams.  These Care Teams include your primary Cardiologist (physician) and Advanced Practice Providers (APPs -  Physician Assistants and Nurse Practitioners) who all work together to provide you with the care you need, when you need it.  We recommend signing up for the patient portal called "MyChart".  Sign up information is provided on this After Visit Summary.  MyChart is used to connect with patients for Virtual Visits (Telemedicine).  Patients are able to view lab/test results, encounter notes, upcoming appointments, etc.  Non-urgent messages can be sent to your provider as well.   To learn more about what you can do with MyChart, go to [email or web address].    Your next appointment:   As needed  Provider:   Eleonore Chiquito, MD   Please see primary care provider. Thank you!     Signed, Addison Naegeli. Audie Box, MD, [facility]  [address], [place], [place] [address] [phone]  04/04/2022 11:19 AM

## 2022-04-04 ENCOUNTER — Encounter: Payer: Self-pay | Admitting: Cardiovascular Disease

## 2022-04-04 ENCOUNTER — Ambulatory Visit: Payer: PPO | Attending: Cardiovascular Disease | Admitting: Cardiovascular Disease

## 2022-04-04 VITALS — BP 136/64 | HR 68 | Ht 67.0 in | Wt 169.2 lb

## 2022-04-04 DIAGNOSIS — I6522 Occlusion and stenosis of left carotid artery: Secondary | ICD-10-CM

## 2022-04-04 DIAGNOSIS — E782 Mixed hyperlipidemia: Secondary | ICD-10-CM

## 2022-04-04 DIAGNOSIS — R7401 Elevation of levels of liver transaminase levels: Secondary | ICD-10-CM | POA: Diagnosis not present

## 2022-04-04 DIAGNOSIS — R1013 Epigastric pain: Secondary | ICD-10-CM | POA: Diagnosis not present

## 2022-04-04 DIAGNOSIS — R072 Precordial pain: Secondary | ICD-10-CM

## 2022-04-04 NOTE — Patient Instructions (Addendum)
Medication Instructions:  The current medical regimen is effective;  continue present plan and medications.  *If you need a refill on your cardiac medications before your next appointment, please call your pharmacy*   Follow-Up: At [facility], you and your health needs are our priority.  As part of our continuing mission to provide you with exceptional heart care, we have created designated Provider Care Teams.  These Care Teams include your primary Cardiologist (physician) and Advanced Practice Providers (APPs -  Physician Assistants and Nurse Practitioners) who all work together to provide you with the care you need, when you need it.  We recommend signing up for the patient portal called "MyChart".  Sign up information is provided on this After Visit Summary.  MyChart is used to connect with patients for Virtual Visits (Telemedicine).  Patients are able to view lab/test results, encounter notes, upcoming appointments, etc.  Non-urgent messages can be sent to your provider as well.   To learn more about what you can do with MyChart, go to [email or web address].    Your next appointment:   As needed  Provider:   Eleonore Chiquito, MD   Please see primary care provider. Thank you!

## 2022-04-21 DIAGNOSIS — N529 Male erectile dysfunction, unspecified: Secondary | ICD-10-CM | POA: Diagnosis not present

## 2022-04-21 DIAGNOSIS — D649 Anemia, unspecified: Secondary | ICD-10-CM | POA: Diagnosis not present

## 2022-04-21 DIAGNOSIS — Z8673 Personal history of transient ischemic attack (TIA), and cerebral infarction without residual deficits: Secondary | ICD-10-CM | POA: Diagnosis not present

## 2022-04-21 DIAGNOSIS — E782 Mixed hyperlipidemia: Secondary | ICD-10-CM | POA: Diagnosis not present

## 2022-04-21 DIAGNOSIS — I6930 Unspecified sequelae of cerebral infarction: Secondary | ICD-10-CM | POA: Diagnosis not present

## 2022-04-21 DIAGNOSIS — E119 Type 2 diabetes mellitus without complications: Secondary | ICD-10-CM | POA: Diagnosis not present

## 2022-04-21 DIAGNOSIS — I10 Essential (primary) hypertension: Secondary | ICD-10-CM | POA: Diagnosis not present

## 2022-04-21 DIAGNOSIS — J309 Allergic rhinitis, unspecified: Secondary | ICD-10-CM | POA: Diagnosis not present

## 2022-04-21 DIAGNOSIS — R413 Other amnesia: Secondary | ICD-10-CM | POA: Diagnosis not present

## 2022-04-21 DIAGNOSIS — E1169 Type 2 diabetes mellitus with other specified complication: Secondary | ICD-10-CM | POA: Diagnosis not present

## 2022-04-21 DIAGNOSIS — J439 Emphysema, unspecified: Secondary | ICD-10-CM | POA: Diagnosis not present

## 2022-05-20 DIAGNOSIS — M25512 Pain in left shoulder: Secondary | ICD-10-CM | POA: Diagnosis not present

## 2022-05-29 DIAGNOSIS — M5030 Other cervical disc degeneration, unspecified cervical region: Secondary | ICD-10-CM | POA: Diagnosis not present

## 2022-05-29 DIAGNOSIS — Z6826 Body mass index (BMI) 26.0-26.9, adult: Secondary | ICD-10-CM | POA: Diagnosis not present

## 2022-05-29 DIAGNOSIS — M2559 Pain in other specified joint: Secondary | ICD-10-CM | POA: Diagnosis not present

## 2022-05-29 DIAGNOSIS — E663 Overweight: Secondary | ICD-10-CM | POA: Diagnosis not present

## 2022-05-29 DIAGNOSIS — R7989 Other specified abnormal findings of blood chemistry: Secondary | ICD-10-CM | POA: Diagnosis not present

## 2022-05-29 DIAGNOSIS — R0789 Other chest pain: Secondary | ICD-10-CM | POA: Diagnosis not present

## 2022-06-18 ENCOUNTER — Other Ambulatory Visit: Payer: Self-pay | Admitting: Internal Medicine

## 2022-06-18 DIAGNOSIS — R0789 Other chest pain: Secondary | ICD-10-CM

## 2022-06-18 DIAGNOSIS — M2559 Pain in other specified joint: Secondary | ICD-10-CM | POA: Diagnosis not present

## 2022-06-18 DIAGNOSIS — E663 Overweight: Secondary | ICD-10-CM | POA: Diagnosis not present

## 2022-06-18 DIAGNOSIS — Z6826 Body mass index (BMI) 26.0-26.9, adult: Secondary | ICD-10-CM | POA: Diagnosis not present

## 2022-06-18 DIAGNOSIS — M5030 Other cervical disc degeneration, unspecified cervical region: Secondary | ICD-10-CM | POA: Diagnosis not present

## 2022-06-18 DIAGNOSIS — R7989 Other specified abnormal findings of blood chemistry: Secondary | ICD-10-CM | POA: Diagnosis not present

## 2022-07-02 DIAGNOSIS — E119 Type 2 diabetes mellitus without complications: Secondary | ICD-10-CM | POA: Diagnosis not present

## 2022-07-02 DIAGNOSIS — Z961 Presence of intraocular lens: Secondary | ICD-10-CM | POA: Diagnosis not present

## 2022-07-02 DIAGNOSIS — H353132 Nonexudative age-related macular degeneration, bilateral, intermediate dry stage: Secondary | ICD-10-CM | POA: Diagnosis not present

## 2022-07-07 ENCOUNTER — Ambulatory Visit (INDEPENDENT_AMBULATORY_CARE_PROVIDER_SITE_OTHER): Payer: PPO | Admitting: Neurology

## 2022-07-07 VITALS — BP 121/73 | HR 99 | Ht 67.0 in | Wt 166.2 lb

## 2022-07-07 DIAGNOSIS — G3184 Mild cognitive impairment, so stated: Secondary | ICD-10-CM | POA: Diagnosis not present

## 2022-07-07 DIAGNOSIS — R413 Other amnesia: Secondary | ICD-10-CM

## 2022-07-07 DIAGNOSIS — I6522 Occlusion and stenosis of left carotid artery: Secondary | ICD-10-CM

## 2022-07-07 NOTE — Progress Notes (Signed)
[facility] [address] [place]. [place] [address] [phone]       HOSPITAL FOLLOW UP NOTE  Timothy Brewer Date of Birth:  08-09-1940 Medical Record Number:  [id number]   Reason for Referral:  hospital stroke follow up    SUBJECTIVE:   CHIEF COMPLAINT:  Chief Complaint  Patient presents with   Follow-up    Patient in room #20 with his wife. Patient states he's here for a f/u on his stroke and his memory loss.    HPI:   Timothy Brewer is a 82 y.o. male with history of HTN, HLD, DM, former tobacco use, and history of prior stroke who presented on 06/03/2020 sudden onset confusion and speech difficulty lasting 25 to 30 minutes and transient right leg weakness.  Personally reviewed hospitalization pertinent progress notes, lab work and imaging with summary provided.  Evaluated by Dr. Pearlean Brownie and likely posterior circulation TIA vs seizure with postictal confusion.  EEG negative.  Recommended aspirin and Brilinta for 30 days then aspirin alone.  LDL 64 on atorvastatin 20 mg daily.  A1c 6.3 on metformin.  Evaluated by therapies and discharged home in stable condition without therapy needs.  Stroke like episode, possible TIA  with negative stroke work up.    Code Stroke CT head No acute intracranial abnormalities. Chronic small vessel ischemic change and brain atrophy. MRI   No evidence of acute infarction, hemorrhage, or mass. Mild chronic microvascular ischemic changes MRA   No evidence of acute infarction, hemorrhage, or mass. Mild chronic microvascular ischemic changes. Normal MRA head. Carotid Doppler: Right Carotid: Velocities in the right ICA are consistent with a 1-39% stenosis.  Left Carotid: Velocities in the left ICA are consistent with a 1-39% stenosis.  Vertebrals: Bilateral vertebral arteries demonstrate antegrade flow.  2D Echo: done, with read pending EEG read is normal. No seizure activity.  LDL 64 HgbA1c 6.3 Antithrombotic: Not  on antithrombotic PTA.  Recommended aspirin 81 mg daily and Brilinta for 4 weeks then aspirin alone Therapy recommendations:  none Disposition:  Home   Prior visit 07/09/2020, Timothy Brewer is being seen for hospital follow-up accompanied by his wife, Timothy Brewer  Doing well since discharge without any reoccurring or new stroke/TIA symptoms.  He has returned back to all prior activities without difficulty.  Completed 4 weeks of Brilinta and remains on aspirin 81 mg daily without associated side effects He does report symptoms of shortness of breath while on Brilinta that resolved upon discontinuing Remains on atorvastatin 20 mg daily without associated side effects Blood pressure today 126/68 -routinely monitors at home and typically 120s/60s  Routinely followed by Dr. Pearlean Brownie for MCI with prior visit 12/2019 and scheduled follow-up visit 12/2020  No further concerns at this time  Update 12/26/2020 : He returns for follow-up after last visit on 07/09/2020 with  Timothy Brewer nurse practitioner.  He is accompanied by his friend Timothy Brewer.  Patient had another episode of TIA on 09/11/2020.  He developed sudden onset of left face and hand numbness lasted about 20 minutes.  He also noticed that he was off balance.  Symptoms resolved by the time days to hospital.  MRI scan of the brain was obtained which I personally reviewed showed no acute abnormality.  Showed only changes of chronic small vessel disease and mild generalized atrophy.  2D echo showed normal ejection fraction of 55 to 60% without cardiac source of embolism.  Transcranial Doppler study on 09/18/2020 was normal.  LDL cholesterol 72 mg percent.  Hemoglobin A1c was 6.0.  Carotid ultrasound showed no significant extracranial stenosis.  Patient was changed from aspirin to Plavix which is tolerating well without bruising or bleeding.  He has had no recurrent TIA or stroke symptoms since then.  He states his blood pressure is under good control and today it is 126/66.  He  states his sugars are also under good control.  He is tolerating Lipitor well without any side effects.  Patient can continues to complain of mild short-term memory difficulties but these are mostly unchanged.  He is worried about developing Alzheimer's since his sister died of it.  On Mini-Mental status exam today scored 29/30 with only 1 deficit in recall.  He still mostly independent in activities of daily living.  Update 07/07/2022 : He returns for follow-up after last visit a year ago.  He is accompanied by his wife.  Continues to have mild short-term memory difficulties which she feels are more or less unchanged.  He often cannot remember names immediately but later on they come to him.  He is still independent and managing all activities of daily living.  He is quite active and works out in [employer] 3 days a week.  He has been taking Prevagen and participating in daily mentally challenging activities.  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  Denies any stroke or TIA symptoms.  He is tolerating Plavix well without bruising or bleeding.  He states his sugars under good control and last A1c checked 3 months ago by primary care physician Dr. Azucena Cecil was 6.7.  He is tolerating Lipitor 20 mg daily well without muscle aches or pains.  He was recently evaluated by rheumatologist for migratory pain has resolved. MMSE today scored 27/36 which is slighty decline from 29/30 at last visit. 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   Procedure: LEFT TOTAL HIP ARTHROPLASTY ANTERIOR APPROACH;  Surgeon: Durene Romans, MD;  Location: [facility];  Service: Orthopedics;  Laterality: Left;  70 mins    Social History:  Social History   Socioeconomic History   Marital status: Married    Spouse name: Not on file   Number of children: 2   Years of education: some college   Highest education level: Not on file  Occupational History   Occupation: retired   Occupation: Cabin crew  Tobacco Use   Smoking status: Former    Packs/day: 2.00    Years: 2.00    Additional pack years: 0.00    Total pack years: 4.00    Types: Cigarettes    Quit date: 07/09/2014    Years since quitting: 8.0   Smokeless tobacco: Never   Tobacco comments:    8-10 a day  Vaping Use   Vaping Use: Never used  Substance and Sexual Activity   Alcohol use: Yes  Alcohol/week: 1.0 standard drink of alcohol    Types: 1 Glasses of wine per week    Comment: socially   Drug use: No   Sexual activity: Yes  Other Topics Concern   Not on file  Social History Narrative   Lives with friend   Right Handed   Drinks 2-3 cups caffeine daily   Social Determinants of Health   Financial Resource Strain: Not on file  Food Insecurity: Not on file  Transportation Needs: Not on file  Physical Activity: Not on file  Stress: Not on file  Social Connections: Not on file  Intimate Partner Violence: Not on file    Family History:  Family History  Problem Relation Age of Onset   Stroke Maternal Grandmother     Medications:   Current Outpatient Medications on File Prior to Visit  Medication Sig Dispense Refill   Apoaequorin (PREVAGEN) 10 MG CAPS Take 1 capsule by mouth daily.     atorvastatin (LIPITOR) 20 MG tablet Take 1 tablet (20 mg total) by mouth daily at 6 PM. 30 tablet 0   Calcium Carb-Cholecalciferol (CALCIUM 600 + D PO) Take 1 tablet by mouth daily.      clopidogrel (PLAVIX) 75 MG tablet TAKE 1 TABLET BY  MOUTH EVERY DAY 90 tablet 3   magnesium oxide (MAG-OX) 400 MG tablet Take 400 mg by mouth daily.     metFORMIN (GLUCOPHAGE-XR) 500 MG 24 hr tablet Take 1 tablet (500 mg total) by mouth 2 (two) times daily.     Multiple Vitamins-Minerals (PRESERVISION AREDS PO) Take by mouth 2 (two) times daily.     sildenafil (REVATIO) 20 MG tablet Take 40-100 mg by mouth daily as needed (erectile dysfunction).     triamcinolone cream (KENALOG) 0.1 % Apply 1 Application topically as needed.     valsartan (DIOVAN) 40 MG tablet Take 40 mg by mouth daily.     vitamin B-12 (CYANOCOBALAMIN) 500 MCG tablet Take 500 mcg by mouth daily.     No current facility-administered medications on file prior to visit.    Allergies:   Allergies  Allergen Reactions   Brilinta [Ticagrelor] Shortness Of Breath   Iodine Hives and Other (See Comments)    Internal only      OBJECTIVE:  Physical Exam  Vitals:   07/07/22 1307  BP: 121/73  Pulse: 99  Weight: 166 lb 3.2 oz (75.4 kg)  Height: 5\' 7"  (1.702 m)   Body mass index is 26.03 kg/m. No results found.  General: well developed, well nourished, very pleasant elderly Caucasian male, seated, in no evident distress Head: head normocephalic and atraumatic.   Neck: supple with no carotid or supraclavicular bruits Cardiovascular: regular rate and rhythm, no murmurs Musculoskeletal: no deformity Skin:  no rash/petichiae Vascular:  Normal pulses all extremities   Neurologic Exam Mental Status: Awake and fully alert.   Fluent speech and language.  Oriented to place and time. Recent and remote memory intact. Attention span, concentration and fund of knowledge appropriate. Mood and affect appropriate.  On Mini-Mental status exam today scored 27/30.  This is likely diminished from last visit.  Clock drawing 4/4.  Able to name 15 animals who can walk on 4 legs Cranial Nerves: Pupils equal, briskly reactive to light. Extraocular movements full without nystagmus. Visual  fields full to confrontation. Hearing intact. Facial sensation intact. Face, tongue, palate moves normally and symmetrically.  Motor: Normal bulk and tone. Normal strength in all tested extremity muscles Sensory.: intact to touch ,  pinprick , position and vibratory sensation.  Coordination: Rapid alternating movements normal in all extremities. Finger-to-nose and heel-to-shin performed accurately bilaterally. Gait and Station: Arises from chair without difficulty. Stance is normal. Gait demonstrates normal stride length and balance without use of assistive device. Tandem walk and heel toe with moderate difficulty.  Reflexes: 1+ and symmetric. Toes downgoing.        07/07/2022    1:09 PM 06/27/2021    1:53 PM 12/26/2020    2:18 PM  MMSE - Mini Mental State Exam  Orientation to time 5 5 5   Orientation to Place 5 5 5   Registration 3 3 3   Attention/ Calculation 2 4 5   Recall 3 3 2   Language- name 2 objects 2 2 2   Language- repeat 1 1 1   Language- follow 3 step command 3 3 3   Language- read & follow direction 1 1 1   Write a sentence 1 1 1   Copy design 1 1 1   Copy design-comments   4 legged animals x 1 min: 10  Total score 27 29 29          ASSESSMENT: Timothy Brewer is a 82 y.o. year old adult presented with episode of possible leg weakness and altered cognition on 06/03/2020 possibly in setting of posterior circulation TIA vs seizure with postictal confusion. Vascular risk factors include HTN, HLD, former tobacco use and history of prior stroke.  Interval new episode of left face and hand numbness on 09/11/2020 likely right hemispheric subcortical TIA from small vessel disease.  He also has mild cognitive impairment which is stable.     PLAN:  I had a long d/w patient about his remote TIA,, risk for recurrent stroke/TIAs, personally independently reviewed imaging studies and stroke evaluation results and answered questions.Continue Plavix (clopidogrel) 75 mg daily  for secondary  stroke prevention and maintain strict control of hypertension with blood pressure goal below 130/90, diabetes with hemoglobin A1c goal below 6.5% and lipids with LDL cholesterol goal below 70 mg/dL.  Check screening follow-up carotid ultrasound study.  Patient was advised to increase participation in cognitively challenging activities like solving crossword puzzles, playing bridge and sudoku.  We also discussed memory compensation strategies.  Followup in the future with me in 1 6 months  or call earlier if necessary. I  spent approximately 35 minutes with face-to-face and non-face-to-face with the patient and wife.  We discussed recent hospitalization possibly in setting of TIA vs seizure type activity, importance of managing stroke risk factors and recommended goals for stroke prevention, continued use of current medications, hx of MCI and risk of developing Alzheimer's and answering questions.  Delia Heady, MD  [facility] [address] [place], [place] [address]  Phone [phone] Fax [phone] Note: This document was prepared with digital dictation and possible smart phrase technology. Any transcriptional errors that result from this process are unintentional.

## 2022-07-07 NOTE — Patient Instructions (Addendum)
I had a long d/w patient about his remote TIA,, risk for recurrent stroke/TIAs, personally independently reviewed imaging studies and stroke evaluation results and answered questions.Continue Plavix (clopidogrel) 75 mg daily  for secondary stroke prevention and maintain strict control of hypertension with blood pressure goal below 130/90, diabetes with hemoglobin A1c goal below 6.5% and lipids with LDL cholesterol goal below 70 mg/dL.  Check screening follow-up carotid ultrasound study.  Patient was advised to increase participation in cognitively challenging activities like solving crossword puzzles, playing bridge and sudoku.  We also discussed memory compensation strategies.  Followup in the future with me in 6 months  or call earlier if necessary.

## 2022-07-17 ENCOUNTER — Ambulatory Visit
Admission: RE | Admit: 2022-07-17 | Discharge: 2022-07-17 | Disposition: A | Discharge status: Home / Self Care | Payer: PPO | Source: Ambulatory Visit | Attending: Internal Medicine | Admitting: Internal Medicine

## 2022-07-17 DIAGNOSIS — R0789 Other chest pain: Secondary | ICD-10-CM | POA: Diagnosis not present

## 2022-07-17 DIAGNOSIS — R072 Precordial pain: Secondary | ICD-10-CM | POA: Diagnosis not present

## 2022-07-17 DIAGNOSIS — I700 Atherosclerosis of aorta: Secondary | ICD-10-CM | POA: Diagnosis not present

## 2022-07-17 DIAGNOSIS — J439 Emphysema, unspecified: Secondary | ICD-10-CM | POA: Diagnosis not present

## 2022-07-18 ENCOUNTER — Ambulatory Visit (HOSPITAL_COMMUNITY)
Admission: RE | Admit: 2022-07-18 | Discharge: 2022-07-18 | Disposition: A | Discharge status: Home / Self Care | Payer: PPO | Source: Ambulatory Visit | Attending: Neurology | Admitting: Neurology

## 2022-07-18 DIAGNOSIS — I6522 Occlusion and stenosis of left carotid artery: Secondary | ICD-10-CM | POA: Insufficient documentation

## 2022-07-18 NOTE — Progress Notes (Signed)
Bilateral carotid ultrasound study completed.   Please see CV Procedures for preliminary results.  Miku Udall, RVT  1:39 PM 07/18/22

## 2022-07-20 ENCOUNTER — Other Ambulatory Visit: Payer: Self-pay | Admitting: Neurology

## 2022-07-22 ENCOUNTER — Other Ambulatory Visit: Payer: PPO

## 2022-07-25 DIAGNOSIS — J441 Chronic obstructive pulmonary disease with (acute) exacerbation: Secondary | ICD-10-CM | POA: Diagnosis not present

## 2022-07-25 DIAGNOSIS — I700 Atherosclerosis of aorta: Secondary | ICD-10-CM | POA: Diagnosis not present

## 2022-07-25 DIAGNOSIS — E119 Type 2 diabetes mellitus without complications: Secondary | ICD-10-CM | POA: Diagnosis not present

## 2022-07-25 DIAGNOSIS — I6930 Unspecified sequelae of cerebral infarction: Secondary | ICD-10-CM | POA: Diagnosis not present

## 2022-07-25 NOTE — Progress Notes (Signed)
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  This can be managed medically at the present time but will need follow-up at 1 to 2-year

## 2022-07-28 DIAGNOSIS — H60312 Diffuse otitis externa, left ear: Secondary | ICD-10-CM | POA: Diagnosis not present

## 2022-07-31 DIAGNOSIS — H60312 Diffuse otitis externa, left ear: Secondary | ICD-10-CM | POA: Diagnosis not present

## 2022-09-01 ENCOUNTER — Telehealth: Payer: Self-pay | Admitting: *Deleted

## 2022-09-01 NOTE — Telephone Encounter (Signed)
Contacted pt back, informed him results were sent via [facility] 5/20 stating Your carotid ultrasound study shows mild 40 to 59% left carotid stenosis(narrowing) in the neck. This can be managed medically at the present time but will need follow-up at 1 to 2-year, per Dr Pearlean Brownie. Please continue current care plan and medications, keep follow-up as scheduled in October.  Patient verbally understood results and was appreciative.  Advised to call the office back with any questions or concerns as he had none at this time.

## 2022-09-01 NOTE — Telephone Encounter (Signed)
Pt called for his doppler results. Please call [phone]

## 2022-09-04 DIAGNOSIS — H60592 Other noninfective acute otitis externa, left ear: Secondary | ICD-10-CM | POA: Diagnosis not present

## 2022-09-15 ENCOUNTER — Other Ambulatory Visit: Payer: Self-pay

## 2022-09-15 ENCOUNTER — Encounter: Payer: Self-pay | Admitting: Family Medicine

## 2022-09-15 ENCOUNTER — Ambulatory Visit (INDEPENDENT_AMBULATORY_CARE_PROVIDER_SITE_OTHER): Payer: PPO

## 2022-09-15 ENCOUNTER — Ambulatory Visit: Payer: PPO | Admitting: Family Medicine

## 2022-09-15 VITALS — BP 130/78 | HR 71 | Ht 67.0 in | Wt 182.0 lb

## 2022-09-15 DIAGNOSIS — R2689 Other abnormalities of gait and mobility: Secondary | ICD-10-CM | POA: Diagnosis not present

## 2022-09-15 DIAGNOSIS — M25512 Pain in left shoulder: Secondary | ICD-10-CM | POA: Diagnosis not present

## 2022-09-15 DIAGNOSIS — G8929 Other chronic pain: Secondary | ICD-10-CM

## 2022-09-15 DIAGNOSIS — M19012 Primary osteoarthritis, left shoulder: Secondary | ICD-10-CM | POA: Diagnosis not present

## 2022-09-15 NOTE — Patient Instructions (Signed)
Thank you for coming in today.  You received an injection today. Seek immediate medical attention if the joint becomes red, extremely painful, or is oozing fluid.  Please get an Xray today before you leave  A referral has been placed to [facility] PT in [place]

## 2022-09-15 NOTE — Progress Notes (Signed)
I, Stevenson Clinch, CMA acting as a scribe for Clementeen Graham, MD.  Timothy Brewer is a 82 y.o. adult who presents to [facility] today for L shoulder pain. Pt was seen previously at [facility] on 05/20/22 for this issue.   Today, pt c/o L shoulder pain x 6 weeks. Pt locates pain to the anterior/lateral aspects of the shoulder. Pt is RHD. Had eval with [facility], reviewed XR from 1 year prior and was provided with HEP, this was not helpful for sx. Gets the most relief with Meloxicam. Unable to reach back or overhead without pain. Denies neck pain.   Radiates: into the upper arm occasionally UE Numbness/tingling: no UE Weakness: no Aggravates: overhead reaching, reaching back Treatments tried: Meloxicam, HEP provided by [facility]  Pertinent review of systems: No fevers or chills  Relevant historical information: History of right shoulder surgery in the past. Notes impaired balance.  Exam:  BP 130/78   Pulse 71   Ht 5\' 7"  (1.702 m)   Wt 182 lb (82.6 kg)   SpO2 95%   BMI 28.51 kg/m  General: Well Developed, well nourished, and in no acute distress.   MSK: Left shoulder: Normal-appearing Engine motion forward flexion intact.  Abduction limited to about 100 degrees and functional internal rotation lumbar spine. External rotation full. Strength: Abduction 4/5.  External rotation 4/5.  Internal rotation intact.    Lab and Radiology Results  Procedure: Real-time Ultrasound Guided Injection of left shoulder subacromial bursa Device: Philips Affiniti 50G/GE Logiq Images permanently stored and available for review in PACS Verbal informed consent obtained.  Discussed risks and benefits of procedure. Warned about infection, bleeding, hyperglycemia damage to structures among others. Patient expresses understanding and agreement Time-out conducted.   Noted no overlying erythema, induration, or other signs of local infection.   Skin prepped in a  sterile fashion.   Local anesthesia: Topical Ethyl chloride.   With sterile technique and under real time ultrasound guidance: 40 mg of Kenalog and 2 mL Marcaine injected into subacromial bursa. Fluid seen entering the bursa.   Completed without difficulty   Pain immediately resolved suggesting accurate placement of the medication.   Advised to call if fevers/chills, erythema, induration, drainage, or persistent bleeding.   Images permanently stored and available for review in the ultrasound unit.  Impression: Technically successful ultrasound guided injection.    X-ray images left shoulder obtained today personally and independently interpreted No severe DJD.  No acute fractures. Await formal radiology review    Assessment and Plan: 82 y.o. adult with left shoulder pain thought to be due to rotator cuff impingement and tendinitis.  Plan for steroid injection subacromial space today.  Refer to physical therapy.  Check back in about 6 weeks.  Additionally he notes some difficulty with balance and since he is going to physical therapy for her shoulder anyway he is interested in some PT to work on balance and fall prevention.  PT ordered for this as well. PDMP not reviewed this encounter. Orders Placed This Encounter  Procedures   [place] LIMITED JOINT SPACE STRUCTURES UP LEFT(NO LINKED CHARGES)    Order Specific Question:   Reason for Exam (SYMPTOM  OR DIAGNOSIS REQUIRED)    Answer:   left shoulder pain    Order Specific Question:   Preferred imaging location?    Answer:   [facility] Shoulder Left    Standing Status:   Future    Number  of Occurrences:   1    Standing Expiration Date:   10/16/2022    Order Specific Question:   Reason for Exam (SYMPTOM  OR DIAGNOSIS REQUIRED)    Answer:   left shoulder pain    Order Specific Question:   Preferred imaging location?    Answer:   [facility]   Ambulatory referral to Physical Therapy    Referral  Priority:   Routine    Referral Type:   Physical Medicine    Referral Reason:   Specialty Services Required    Requested Specialty:   Physical Therapy    Number of Visits Requested:   1   No orders of the defined types were placed in this encounter.    Discussed warning signs or symptoms. Please see discharge instructions. Patient expresses understanding.   The above documentation has been reviewed and is accurate and complete Clementeen Graham, M.D.

## 2022-09-19 NOTE — Progress Notes (Signed)
Left shoulder x-ray shows mild arthritis

## 2022-09-24 DIAGNOSIS — M25512 Pain in left shoulder: Secondary | ICD-10-CM | POA: Diagnosis not present

## 2022-09-24 DIAGNOSIS — M2559 Pain in other specified joint: Secondary | ICD-10-CM | POA: Diagnosis not present

## 2022-09-24 DIAGNOSIS — Z6825 Body mass index (BMI) 25.0-25.9, adult: Secondary | ICD-10-CM | POA: Diagnosis not present

## 2022-09-24 DIAGNOSIS — E663 Overweight: Secondary | ICD-10-CM | POA: Diagnosis not present

## 2022-09-24 DIAGNOSIS — M5030 Other cervical disc degeneration, unspecified cervical region: Secondary | ICD-10-CM | POA: Diagnosis not present

## 2022-09-24 DIAGNOSIS — R7989 Other specified abnormal findings of blood chemistry: Secondary | ICD-10-CM | POA: Diagnosis not present

## 2022-09-24 DIAGNOSIS — R0789 Other chest pain: Secondary | ICD-10-CM | POA: Diagnosis not present

## 2022-10-01 DIAGNOSIS — M7542 Impingement syndrome of left shoulder: Secondary | ICD-10-CM | POA: Diagnosis not present

## 2022-10-07 DIAGNOSIS — M7542 Impingement syndrome of left shoulder: Secondary | ICD-10-CM | POA: Diagnosis not present

## 2022-10-14 DIAGNOSIS — M7542 Impingement syndrome of left shoulder: Secondary | ICD-10-CM | POA: Diagnosis not present

## 2022-10-24 NOTE — Progress Notes (Unsigned)
   Rubin Payor, PhD, LAT, ATC acting as a scribe for Clementeen Graham, MD.  Timothy Brewer is a 82 y.o. male who presents to [facility] at [facility] today for f/u L shoulder pain and balance problems. Pt was last seen by Dr. Denyse Amass on 09/15/22 and was given a L subacromial steroid injection and was referred to [facility] PT.  Today, pt reports he was d/c from PT. He notes his L shoulder feeling much better. Almost no pain.   Dx imaging: 09/15/22 L shoulder XR  Pertinent review of systems: No fevers or chills  Relevant historical information: Diabetes   Exam:  BP 104/62   Pulse 66   Ht 5\' 7"  (1.702 m)   Wt 167 lb (75.8 kg)   SpO2 96%   BMI 26.16 kg/m  General: Well Developed, well nourished, and in no acute distress.   MSK: Left shoulder almost completely normal range of motion        Assessment and Plan: 82 y.o. male with chronic left shoulder pain significantly improved with subacromial injection and physical therapy.  Plan to continue home exercise program taught in PT.  Could repeat this injection if needed in 3 months from the original injection date which would be early to mid October.  Check back as needed.   PDMP not reviewed this encounter. No orders of the defined types were placed in this encounter.  No orders of the defined types were placed in this encounter.    Discussed warning signs or symptoms. Please see discharge instructions. Patient expresses understanding.   The above documentation has been reviewed and is accurate and complete Clementeen Graham, M.D.

## 2022-10-27 ENCOUNTER — Ambulatory Visit: Payer: PPO | Admitting: Family Medicine

## 2022-10-27 VITALS — BP 104/62 | HR 66 | Ht 67.0 in | Wt 167.0 lb

## 2022-10-27 DIAGNOSIS — G8929 Other chronic pain: Secondary | ICD-10-CM

## 2022-10-27 DIAGNOSIS — M25512 Pain in left shoulder: Secondary | ICD-10-CM

## 2022-10-27 NOTE — Patient Instructions (Signed)
Thank you for coming in today.   Continue home exercise.   Recheck as needed.   I can do that same shot as soon as early to mid October.

## 2022-10-30 DIAGNOSIS — J449 Chronic obstructive pulmonary disease, unspecified: Secondary | ICD-10-CM | POA: Diagnosis not present

## 2022-10-30 DIAGNOSIS — I110 Hypertensive heart disease with heart failure: Secondary | ICD-10-CM | POA: Diagnosis not present

## 2022-10-30 DIAGNOSIS — I509 Heart failure, unspecified: Secondary | ICD-10-CM | POA: Diagnosis not present

## 2022-10-30 DIAGNOSIS — Z87891 Personal history of nicotine dependence: Secondary | ICD-10-CM | POA: Diagnosis not present

## 2022-10-30 DIAGNOSIS — Z6826 Body mass index (BMI) 26.0-26.9, adult: Secondary | ICD-10-CM | POA: Diagnosis not present

## 2022-11-19 DIAGNOSIS — E119 Type 2 diabetes mellitus without complications: Secondary | ICD-10-CM | POA: Diagnosis not present

## 2022-11-19 DIAGNOSIS — H353132 Nonexudative age-related macular degeneration, bilateral, intermediate dry stage: Secondary | ICD-10-CM | POA: Diagnosis not present

## 2022-11-19 DIAGNOSIS — H1011 Acute atopic conjunctivitis, right eye: Secondary | ICD-10-CM | POA: Diagnosis not present

## 2022-11-27 DIAGNOSIS — E782 Mixed hyperlipidemia: Secondary | ICD-10-CM | POA: Diagnosis not present

## 2022-11-27 DIAGNOSIS — Z8673 Personal history of transient ischemic attack (TIA), and cerebral infarction without residual deficits: Secondary | ICD-10-CM | POA: Diagnosis not present

## 2022-11-27 DIAGNOSIS — N529 Male erectile dysfunction, unspecified: Secondary | ICD-10-CM | POA: Diagnosis not present

## 2022-11-27 DIAGNOSIS — E1169 Type 2 diabetes mellitus with other specified complication: Secondary | ICD-10-CM | POA: Diagnosis not present

## 2022-11-27 DIAGNOSIS — Z0000 Encounter for general adult medical examination without abnormal findings: Secondary | ICD-10-CM | POA: Diagnosis not present

## 2022-11-27 DIAGNOSIS — Z23 Encounter for immunization: Secondary | ICD-10-CM | POA: Diagnosis not present

## 2022-11-27 DIAGNOSIS — M85832 Other specified disorders of bone density and structure, left forearm: Secondary | ICD-10-CM | POA: Diagnosis not present

## 2022-11-27 DIAGNOSIS — J439 Emphysema, unspecified: Secondary | ICD-10-CM | POA: Diagnosis not present

## 2022-11-27 DIAGNOSIS — I6930 Unspecified sequelae of cerebral infarction: Secondary | ICD-10-CM | POA: Diagnosis not present

## 2022-11-27 DIAGNOSIS — Z8739 Personal history of other diseases of the musculoskeletal system and connective tissue: Secondary | ICD-10-CM | POA: Diagnosis not present

## 2022-11-27 DIAGNOSIS — E119 Type 2 diabetes mellitus without complications: Secondary | ICD-10-CM | POA: Diagnosis not present

## 2022-11-27 DIAGNOSIS — I10 Essential (primary) hypertension: Secondary | ICD-10-CM | POA: Diagnosis not present

## 2022-11-27 DIAGNOSIS — Z1331 Encounter for screening for depression: Secondary | ICD-10-CM | POA: Diagnosis not present

## 2022-11-28 ENCOUNTER — Other Ambulatory Visit: Payer: Self-pay | Admitting: Family Medicine

## 2022-11-28 DIAGNOSIS — M85832 Other specified disorders of bone density and structure, left forearm: Secondary | ICD-10-CM

## 2023-01-05 DIAGNOSIS — H353132 Nonexudative age-related macular degeneration, bilateral, intermediate dry stage: Secondary | ICD-10-CM | POA: Diagnosis not present

## 2023-01-05 DIAGNOSIS — H35371 Puckering of macula, right eye: Secondary | ICD-10-CM | POA: Diagnosis not present

## 2023-01-06 ENCOUNTER — Ambulatory Visit: Payer: PPO | Admitting: Adult Health

## 2023-01-06 ENCOUNTER — Encounter: Payer: Self-pay | Admitting: Adult Health

## 2023-01-06 VITALS — BP 126/65 | HR 64 | Ht 66.0 in | Wt 165.8 lb

## 2023-01-06 DIAGNOSIS — G3184 Mild cognitive impairment, so stated: Secondary | ICD-10-CM | POA: Diagnosis not present

## 2023-01-06 DIAGNOSIS — I6522 Occlusion and stenosis of left carotid artery: Secondary | ICD-10-CM

## 2023-01-06 DIAGNOSIS — R2689 Other abnormalities of gait and mobility: Secondary | ICD-10-CM | POA: Diagnosis not present

## 2023-01-06 DIAGNOSIS — Z8673 Personal history of transient ischemic attack (TIA), and cerebral infarction without residual deficits: Secondary | ICD-10-CM

## 2023-01-06 NOTE — Progress Notes (Signed)
[facility] [address] [place]. [place] [address] [phone]       STROKE FOLLOW UP NOTE  Timothy Brewer Date of Birth:  Nov 25, 1940 Medical Record Number:  [id number]    Primary neurologist: Dr. Pearlean Brownie Reason for visit: stroke follow up    SUBJECTIVE:   CHIEF COMPLAINT:  Chief Complaint  Patient presents with   Follow-up    Patient in room #8 and alone. Patient states he having some balance issues.    HPI:   Update 01/06/2023 JM: Patient returns for follow-up visit unaccompanied. Reports overall doing well since prior visit. He does note difficulties with his balance and gait, present for the past several years, denies worsening, was previously doing program at [facility] for balance but was stopped due to COVID. Does not use AD, no recent falls. Reports cognition has been stable since prior visit, greater difficulty with short term memory.  Continues to take Prevagen.  Continues to drive without difficulty.  Compliant on show prevention medications.  Routinely follows with PCP for stroke risk factor management.    History provided for reference purposes only Update 07/07/2022 Dr. Pearlean Brownie: He returns for follow-up after last visit a year ago.  He is accompanied by his wife.  Continues to have mild short-term memory difficulties which she feels are more or less unchanged.  He often cannot remember names immediately but later on they come to him.  He is still independent and managing all activities of daily living.  He is quite active and works out in [employer] 3 days a week.  He has been taking Prevagen and participating in daily mentally challenging activities.  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  Denies any stroke or TIA symptoms.  He is tolerating Plavix well without bruising or bleeding.  He states his sugars under good control and last A1c checked 3 months ago by primary care physician Dr. Azucena Cecil  was 6.7.  He is tolerating Lipitor 20 mg daily well without muscle aches or pains.  He was recently evaluated by rheumatologist for migratory pain has resolved. MMSE today scored 27/36 which is slighty decline from 29/30 at last visit.  Update 12/26/2020 Dr. Pearlean Brownie: He returns for follow-up after last visit on 07/09/2020 with  Timothy Brewer nurse practitioner.  He is accompanied by his friend Timothy Brewer.  Patient had another episode of TIA on 09/11/2020.  He developed sudden onset of left face and hand numbness lasted about 20 minutes.  He also noticed that he was off balance.  Symptoms resolved by the time days to hospital.  MRI scan of the brain was obtained which I personally reviewed showed no acute abnormality.  Showed only changes of chronic small vessel disease and mild generalized atrophy.  2D echo showed normal ejection fraction of 55 to 60% without cardiac source of embolism.  Transcranial Doppler study on 09/18/2020 was normal.  LDL cholesterol 72 mg percent.  Hemoglobin A1c was 6.0.  Carotid ultrasound showed no significant extracranial stenosis.  Patient was changed from aspirin to Plavix which is tolerating well without bruising or bleeding.  He has had no recurrent TIA or stroke symptoms since then.  He states his blood pressure is under good control and today it is 126/66.  He states his sugars are also under good control.  He is tolerating Lipitor well without any side effects.  Patient can continues to complain of mild short-term memory difficulties but these are  mostly unchanged.  He is worried about developing Alzheimer's since his sister died of it.  On Mini-Mental status exam today scored 29/30 with only 1 deficit in recall.  He still mostly independent in activities of daily living.  Update 07/09/2020 JM: Mr. [name] is being seen for hospital follow-up accompanied by his wife, Timothy Brewer  Doing well since discharge without any reoccurring or new stroke/TIA symptoms.  He has returned back to all prior  activities without difficulty.  Completed 4 weeks of Brilinta and remains on aspirin 81 mg daily without associated side effects He does report symptoms of shortness of breath while on Brilinta that resolved upon discontinuing Remains on atorvastatin 20 mg daily without associated side effects Blood pressure today 126/68 -routinely monitors at home and typically 120s/60s  Routinely followed by Dr. Pearlean Brownie for MCI with prior visit 12/2019 and scheduled follow-up visit 12/2020  No further concerns at this time   Stroke admission 06/03/2020: [name] is a 82 y.o. male with history of HTN, HLD, DM, former tobacco use, and history of prior stroke who presented on 06/03/2020 sudden onset confusion and speech difficulty lasting 25 to 30 minutes and transient right leg weakness.  Personally reviewed hospitalization pertinent progress notes, lab work and imaging with summary provided.  Evaluated by Dr. Pearlean Brownie and likely posterior circulation TIA vs seizure with postictal confusion.  EEG negative.  Recommended aspirin and Brilinta for 30 days then aspirin alone.  LDL 64 on atorvastatin 20 mg daily.  A1c 6.3 on metformin.  Evaluated by therapies and discharged home in stable condition without therapy needs.  Stroke like episode, possible TIA  with negative stroke work up.    Code Stroke CT head No acute intracranial abnormalities. Chronic small vessel ischemic change and brain atrophy. MRI   No evidence of acute infarction, hemorrhage, or mass. Mild chronic microvascular ischemic changes MRA   No evidence of acute infarction, hemorrhage, or mass. Mild chronic microvascular ischemic changes. Normal MRA head. Carotid Doppler: Right Carotid: Velocities in the right ICA are consistent with a 1-39% stenosis.  Left Carotid: Velocities in the left ICA are consistent with a 1-39% stenosis.  Vertebrals: Bilateral vertebral arteries demonstrate antegrade flow.  2D Echo: done, with read pending EEG  read is normal. No seizure activity.  LDL 64 HgbA1c 6.3 Antithrombotic: Not on antithrombotic PTA.  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   Procedure: LEFT TOTAL HIP ARTHROPLASTY ANTERIOR APPROACH;  Surgeon: Durene Romans, MD;  Location: [facility];  Service: Orthopedics;  Laterality: Left;  70 mins    Social History:  Social History   Socioeconomic History   Marital status: Married    Spouse name: Not on file   Number of children: 2  Years of education: some college   Highest education level: Not on file  Occupational History   Occupation: retired   Occupation: Cabin crew  Tobacco Use   Smoking status: Former    Current packs/day: 0.00    Average packs/day: 2.0 packs/day for 2.0 years (4.0 ttl pk-yrs)    Types: Cigarettes    Start date: 07/08/2012    Quit date: 07/09/2014    Years since quitting: 8.5   Smokeless tobacco: Never   Tobacco comments:    8-10 a day  Vaping Use   Vaping status: Never Used  Substance and Sexual Activity   Alcohol use: Yes    Alcohol/week: 1.0 standard drink of alcohol    Types: 1 Glasses of wine per week    Comment: socially   Drug use: No   Sexual activity: Yes  Other Topics Concern   Not on file  Social History Narrative   Lives with friend   Right Handed   Drinks 2-3 cups caffeine daily   Social Determinants of Health    Financial Resource Strain: Not on file  Food Insecurity: Not on file  Transportation Needs: Not on file  Physical Activity: Not on file  Stress: Not on file  Social Connections: Not on file  Intimate Partner Violence: Not on file    Family History:  Family History  Problem Relation Age of Onset   Stroke Maternal Grandmother     Medications:   Current Outpatient Medications on File Prior to Visit  Medication Sig Dispense Refill   acetic acid 2 % otic solution Place 3 drops into both ears 3 (three) times daily.     Apoaequorin (PREVAGEN) 10 MG CAPS Take 1 capsule by mouth daily.     atorvastatin (LIPITOR) 20 MG tablet Take 1 tablet (20 mg total) by mouth daily at 6 PM. 30 tablet 0   Calcium Carb-Cholecalciferol (CALCIUM 600 + D PO) Take 1 tablet by mouth daily.      calcium carbonate (SUPER CALCIUM) 1500 (600 Ca) MG TABS tablet Take 1,500 mg by mouth daily with breakfast.     cetirizine (ZYRTEC) 10 MG tablet Take 10 mg by mouth daily.     cholecalciferol (VITAMIN D3) 25 MCG (1000 UNIT) tablet Take 1,000 Units by mouth daily.     clopidogrel (PLAVIX) 75 MG tablet TAKE 1 TABLET BY MOUTH EVERY DAY 90 tablet 3   Ferrous Sulfate (IRON) 325 (65 Fe) MG TABS Take 1 tablet by mouth daily.     magnesium oxide (MAG-OX) 400 MG tablet Take 400 mg by mouth daily.     metFORMIN (GLUCOPHAGE-XR) 500 MG 24 hr tablet Take 1 tablet (500 mg total) by mouth 2 (two) times daily.     Multiple Vitamins-Minerals (PRESERVISION AREDS PO) Take by mouth 2 (two) times daily.     neomycin-polymyxin b-dexamethasone (MAXITROL) 3.5-10000-0.1 SUSP Place 2 drops into both eyes every 4 (four) hours.     sildenafil (REVATIO) 20 MG tablet Take 40-100 mg by mouth daily as needed (erectile dysfunction).     valsartan (DIOVAN) 40 MG tablet Take 40 mg by mouth daily.     vitamin B-12 (CYANOCOBALAMIN) 500 MCG tablet Take 500 mcg by mouth daily.     No current facility-administered medications on file prior to visit.     Allergies:   Allergies  Allergen Reactions   Brilinta [Ticagrelor] Shortness Of Breath   Pantoprazole Hives   Rosuvastatin     Other Reaction(s): memory fog   Iodine Hives  and Other (See Comments)    Internal only      OBJECTIVE:  Physical Exam  Vitals:   01/06/23 1312  BP: 126/65  Pulse: 64  Weight: 165 lb 12.8 oz (75.2 kg)  Height: 5\' 6"  (1.676 m)    Body mass index is 26.76 kg/m. No results found.  General: well developed, well nourished, very pleasant elderly Caucasian male, seated, in no evident distress Head: head normocephalic and atraumatic.   Neck: supple with no carotid or supraclavicular bruits Cardiovascular: regular rate and rhythm, no murmurs Musculoskeletal: no deformity Skin:  no rash/petichiae Vascular:  Normal pulses all extremities   Neurologic Exam Mental Status: Awake and fully alert.   Fluent speech and language.  Oriented to place and time. Recent memory mildly impaired and remote memory intact. Attention span, concentration and fund of knowledge appropriate. Mood and affect appropriate.   Cranial Nerves: Pupils equal, briskly reactive to light. Extraocular movements full without nystagmus. Visual fields full to confrontation. Hearing intact. Facial sensation intact. Face, tongue, palate moves normally and symmetrically.  Motor: Normal bulk and tone. Normal strength in all tested extremity muscles although limited exam on LUE d/t shoulder pain Sensory.: intact to touch , pinprick , position and vibratory sensation.  Coordination: Rapid alternating movements normal in all extremities. Finger-to-nose and heel-to-shin performed accurately bilaterally. Gait and Station: Arises from chair without difficulty. Stance is hunched with curvature of spine. Gait demonstrates normal stride length and mild imbalance without use of AD  Reflexes: 1+ and symmetric. Toes downgoing.      07/07/2022    1:09 PM 06/27/2021    1:53 PM 12/26/2020    2:18 PM  MMSE  - Mini Mental State Exam  Orientation to time 5 5 5   Orientation to Place 5 5 5   Registration 3 3 3   Attention/ Calculation 2 4 5   Recall 3 3 2   Language- name 2 objects 2 2 2   Language- repeat 1 1 1   Language- follow 3 step command 3 3 3   Language- read & follow direction 1 1 1   Write a sentence 1 1 1   Copy design 1 1 1   Copy design-comments   4 legged animals x 1 min: 10  Total score 27 29 29           ASSESSMENT: GIB AYLSWORTH is a 82 y.o. year old male presented with episode of possible leg weakness and altered cognition on 06/03/2020 possibly in setting of posterior circulation TIA vs seizure with postictal confusion. Vascular risk factors include HTN, HLD, former tobacco use, left carotid stenosis, and history of prior stroke.  Interval new episode of left face and hand numbness on 09/11/2020 likely right hemispheric subcortical TIA from small vessel disease.  He also has mild cognitive impairment which is stable. C/o chronic imbalance over the past several years without progression     PLAN:  1.  History of TIA  -Continue clopidogrel 75 mg daily and atorvastatin 20 mg daily for secondary stroke prevention measures managed/prescribed by PCP  -Continue to follow with PCP for aggressive stroke risk factor manage including BP goal<130/90 and HLD with LDL goal<70  2.  Mild cognitive impairment  -MMSE prior 27/30, will repeat at f/u visit  -Consider routine physical and cognitive exercises as well as healthy diet and good sleep hygiene.  Discussed importance of management of vascular risk factors  3. Chronic gait impairment  -Present over the past several years without progression  -Suspect multifactorial with likely scoliosis, multiple joint  arthritis and age  -offered PT but declines interest, advised to call if interested in pursuing   -Fall precautions discussed although thankfully no recent falls  3.  Left carotid stenosis  -CUS 07/2022 left ICA 40 to 59% stenosis, R ICA  1 to 39% stenosis  -CUS 06/2021 left ICA 40 to 59% stenosis, R ICA 1 to 39% stenosis  -Plan on repeat around 07/2023 for surveillance monitoring     Follow-up in 6 months or call earlier if needed     I spent 30 minutes of face-to-face and non-face-to-face time with patient.  This included previsit chart review, lab review, study review, order entry, electronic health record documentation, patient education and discussion regarding above diagnoses and treatment plan and answered all other questions to patient's satisfaction  Ihor Austin, [facility]  [facility] [address] [place], [place] [address]  Phone [phone] Fax [phone] Note: This document was prepared with digital dictation and possible smart phrase technology. Any transcriptional errors that result from this process are unintentional.

## 2023-01-06 NOTE — Patient Instructions (Addendum)
Your Plan:  Continue current treatment   Please monitor balance/walking, if this should worsen please let me know    Follow up in 6 months or call earlier if needed     Thank you for coming to see [place] at [facility]. I hope we have been able to provide you high quality care today.  You may receive a patient satisfaction survey over the next few weeks. We would appreciate your feedback and comments so that we may continue to improve ourselves and the health of our patients.

## 2023-01-12 ENCOUNTER — Ambulatory Visit: Payer: PPO | Admitting: Podiatry

## 2023-01-12 ENCOUNTER — Encounter: Payer: Self-pay | Admitting: Podiatry

## 2023-01-12 VITALS — Ht 66.0 in | Wt 165.8 lb

## 2023-01-12 DIAGNOSIS — L84 Corns and callosities: Secondary | ICD-10-CM | POA: Diagnosis not present

## 2023-01-12 DIAGNOSIS — I739 Peripheral vascular disease, unspecified: Secondary | ICD-10-CM | POA: Diagnosis not present

## 2023-01-12 DIAGNOSIS — L603 Nail dystrophy: Secondary | ICD-10-CM | POA: Diagnosis not present

## 2023-01-12 DIAGNOSIS — B351 Tinea unguium: Secondary | ICD-10-CM

## 2023-01-12 DIAGNOSIS — M2012 Hallux valgus (acquired), left foot: Secondary | ICD-10-CM | POA: Diagnosis not present

## 2023-01-12 NOTE — Progress Notes (Signed)
    Subjective:  Patient ID: Timothy Brewer, male    DOB: [birth year],  MRN: [id number]  Timothy Brewer presents to clinic today for:  Chief Complaint  Patient presents with   Nail Problem    PT is here dur to issue with left greater toe nail that is spli and corn on second toe on left foot, pt is a diabetic.   Patient presents with c/o possible fungus to left hallux nail.  Notes some discoloration distally to the nail.  Denies recent injury.  Denies drainage.  Also has c/o painful corn on medial left 2nd toe where the big toe has been rubbing against it.    PCP is Timothy Joe, MD.  Date last seen ~11/27/22.  Past Medical History:  Diagnosis Date   Arthritis    Diabetes mellitus without complication (HCC)    Hypertension    Memory loss    Pre-diabetes    on metformin   Stroke (HCC)     Allergies  Allergen Reactions   Brilinta [Ticagrelor] Shortness Of Breath   Pantoprazole Hives   Rosuvastatin     Other Reaction(s): memory fog   Iodine Hives and Other (See Comments)    Internal only   Objective:  Timothy Brewer is a pleasant 82 y.o. male in NAD. AAO x 3.  Vascular Examination: Patient has palpable DP pulse, absent PT pulse bilateral.  Delayed capillary refill bilateral toes.  Sparse digital hair bilateral.  Proximal to distal cooling WNL bilateral.    Dermatological Examination: Interspaces are clear with no open lesions noted bilateral.  Skin is shiny and atrophic bilateral.  Left hallux nail is yellow and thickened with distal onycholysis and some subungual debris.   There are hyperkeratotic lesions noted medial aspect of left 2nd toe PIPJ .  Musculoskeletal Examination: Lateral deviation of left hallux, abutting the 2nd toe.  This is reducible when patient relaxes his toes during exam.  Patient qualifies for at-risk foot care because of PVD .  Assessment/Plan: 1. Nail dystrophy   2. Hallux valgus, left   3. Corn of toe   4. PVD (peripheral vascular disease)  (HCC)     Clippings of the left hallux nail were obtained and sent to [facility] labs for fungal culture.  Patient informed it may take up to 4 weeks to grow in the lab before getting final report.  Will call him to review and discuss treatment options.  Hyperkeratotic lesion was shaved with #312 blade.  He was fitted with a gel single loop toe spacer to wear on the 2nd toe and push the hallux away from the 2nd toe to prevent recurrence of corn.  If this provides no improvement moving forward, he may need to consider hallux valgus correction and hammertoe repair.    Return if symptoms worsen or fail to improve.   Timothy Brewer, DPM, FACFAS [facility]     [address] [place], [place] [address]                Office [phone]  Fax [phone]

## 2023-01-19 ENCOUNTER — Other Ambulatory Visit: Payer: Self-pay | Admitting: Podiatry

## 2023-01-19 MED ORDER — EFINACONAZOLE 10 % EX SOLN: 1.0000 [drp] | Freq: Every day | CUTANEOUS | 11 refills | Status: DC

## 2023-01-19 NOTE — Addendum Note (Signed)
Addended by: Lucia Estelle D on: 01/19/2023 07:33 PM   Modules accepted: Orders

## 2023-01-20 ENCOUNTER — Encounter: Payer: Self-pay | Admitting: Podiatry

## 2023-01-21 ENCOUNTER — Telehealth: Payer: Self-pay | Admitting: Podiatry

## 2023-01-21 MED ORDER — CICLOPIROX 8 % EX SOLN: Freq: Every day | CUTANEOUS | 11 refills | Status: DC

## 2023-01-21 NOTE — Telephone Encounter (Signed)
Patient noted his Rx Jublia was $240.  There are 2 other FDA approved options for toenail fungus that are prescription-strength.  Will sent in the ciclopirox to his pharmacy now to see if that is affordable for patient.    Sent to [facility] on [address]

## 2023-01-21 NOTE — Telephone Encounter (Signed)
Left message that penlac was sent in to his pharmacy in the chart and to call if any issues.

## 2023-01-21 NOTE — Telephone Encounter (Signed)
Pt called and the medication that you prescribed was about 240.00 could you please can you send something else in for his toenails.

## 2023-02-18 DIAGNOSIS — Z96642 Presence of left artificial hip joint: Secondary | ICD-10-CM | POA: Diagnosis not present

## 2023-03-25 ENCOUNTER — Emergency Department (HOSPITAL_COMMUNITY): Payer: PPO

## 2023-03-25 ENCOUNTER — Observation Stay (HOSPITAL_COMMUNITY): Payer: PPO

## 2023-03-25 ENCOUNTER — Encounter (HOSPITAL_COMMUNITY): Payer: Self-pay

## 2023-03-25 ENCOUNTER — Other Ambulatory Visit: Payer: Self-pay

## 2023-03-25 ENCOUNTER — Observation Stay (HOSPITAL_COMMUNITY)
Admission: EM | Admit: 2023-03-25 | Discharge: 2023-03-27 | Disposition: A | Discharge status: Home / Self Care | Payer: PPO | Attending: Internal Medicine | Admitting: Internal Medicine

## 2023-03-25 DIAGNOSIS — I639 Cerebral infarction, unspecified: Secondary | ICD-10-CM | POA: Diagnosis not present

## 2023-03-25 DIAGNOSIS — E1159 Type 2 diabetes mellitus with other circulatory complications: Secondary | ICD-10-CM | POA: Diagnosis present

## 2023-03-25 DIAGNOSIS — Z96642 Presence of left artificial hip joint: Secondary | ICD-10-CM | POA: Insufficient documentation

## 2023-03-25 DIAGNOSIS — I152 Hypertension secondary to endocrine disorders: Secondary | ICD-10-CM | POA: Diagnosis present

## 2023-03-25 DIAGNOSIS — Z7984 Long term (current) use of oral hypoglycemic drugs: Secondary | ICD-10-CM | POA: Insufficient documentation

## 2023-03-25 DIAGNOSIS — Z7902 Long term (current) use of antithrombotics/antiplatelets: Secondary | ICD-10-CM | POA: Diagnosis not present

## 2023-03-25 DIAGNOSIS — Z87891 Personal history of nicotine dependence: Secondary | ICD-10-CM | POA: Diagnosis not present

## 2023-03-25 DIAGNOSIS — E119 Type 2 diabetes mellitus without complications: Secondary | ICD-10-CM | POA: Diagnosis not present

## 2023-03-25 DIAGNOSIS — E1169 Type 2 diabetes mellitus with other specified complication: Secondary | ICD-10-CM | POA: Diagnosis present

## 2023-03-25 DIAGNOSIS — I6389 Other cerebral infarction: Secondary | ICD-10-CM | POA: Diagnosis not present

## 2023-03-25 DIAGNOSIS — E785 Hyperlipidemia, unspecified: Secondary | ICD-10-CM | POA: Diagnosis not present

## 2023-03-25 DIAGNOSIS — I10 Essential (primary) hypertension: Secondary | ICD-10-CM | POA: Insufficient documentation

## 2023-03-25 DIAGNOSIS — R200 Anesthesia of skin: Secondary | ICD-10-CM

## 2023-03-25 DIAGNOSIS — Z79899 Other long term (current) drug therapy: Secondary | ICD-10-CM | POA: Diagnosis not present

## 2023-03-25 LAB — I-STAT CHEM 8, ED
BUN: 26 mg/dL — ABNORMAL HIGH (ref 8–23)
Calcium, Ion: 1.12 mmol/L — ABNORMAL LOW (ref 1.15–1.40)
Chloride: 105 mmol/L (ref 98–111)
Creatinine, Ser: 1.2 mg/dL (ref 0.61–1.24)
Glucose, Bld: 89 mg/dL (ref 70–99)
HCT: 44 % (ref 39.0–52.0)
Hemoglobin: 15 g/dL (ref 13.0–17.0)
Potassium: 4.4 mmol/L (ref 3.5–5.1)
Sodium: 138 mmol/L (ref 135–145)
TCO2: 24 mmol/L (ref 22–32)

## 2023-03-25 LAB — COMPREHENSIVE METABOLIC PANEL
ALT: 19 U/L (ref 0–44)
AST: 23 U/L (ref 15–41)
Albumin: 3.8 g/dL (ref 3.5–5.0)
Alkaline Phosphatase: 58 U/L (ref 38–126)
Anion gap: 9 (ref 5–15)
BUN: 21 mg/dL (ref 8–23)
CO2: 24 mmol/L (ref 22–32)
Calcium: 9.5 mg/dL (ref 8.9–10.3)
Chloride: 106 mmol/L (ref 98–111)
Creatinine, Ser: 1.19 mg/dL (ref 0.61–1.24)
GFR, Estimated: >60 mL/min (ref >60)
Glucose, Bld: 92 mg/dL (ref 70–99)
Potassium: 4.4 mmol/L (ref 3.5–5.1)
Sodium: 139 mmol/L (ref 135–145)
Total Bilirubin: 0.8 mg/dL (ref 0.0–1.2)
Total Protein: 6.8 g/dL (ref 6.5–8.1)

## 2023-03-25 LAB — DIFFERENTIAL
Abs Immature Granulocytes: 0.02 10*3/uL (ref 0.00–0.07)
Basophils Absolute: 0.1 10*3/uL (ref 0.0–0.1)
Basophils Relative: 1 %
Eosinophils Absolute: 0.5 10*3/uL (ref 0.0–0.5)
Eosinophils Relative: 7 %
Immature Granulocytes: 0 %
Lymphocytes Relative: 19 %
Lymphs Abs: 1.3 10*3/uL (ref 0.7–4.0)
Monocytes Absolute: 0.9 10*3/uL (ref 0.1–1.0)
Monocytes Relative: 14 %
Neutro Abs: 3.9 10*3/uL (ref 1.7–7.7)
Neutrophils Relative %: 59 %

## 2023-03-25 LAB — CBC
HCT: 42.2 % (ref 39.0–52.0)
Hemoglobin: 14.1 g/dL (ref 13.0–17.0)
MCH: 32.2 pg (ref 26.0–34.0)
MCHC: 33.4 g/dL (ref 30.0–36.0)
MCV: 96.3 fL (ref 80.0–100.0)
Platelets: 310 10*3/uL (ref 150–400)
RBC: 4.38 MIL/uL (ref 4.22–5.81)
RDW: 13.4 % (ref 11.5–15.5)
WBC: 6.6 10*3/uL (ref 4.0–10.5)
nRBC: 0 % (ref 0.0–0.2)

## 2023-03-25 LAB — ETHANOL: Alcohol, Ethyl (B): <10 mg/dL (ref <10)

## 2023-03-25 LAB — PROTIME-INR
INR: 0.9 (ref 0.8–1.2)
Prothrombin Time: 12 s (ref 11.4–15.2)

## 2023-03-25 MED ORDER — ATORVASTATIN CALCIUM 10 MG PO TABS: 20.0000 mg | ORAL_TABLET | Freq: Every day | ORAL | Status: DC

## 2023-03-25 MED ORDER — ACETAMINOPHEN 325 MG PO TABS: 650.0000 mg | ORAL_TABLET | ORAL | Status: DC | PRN

## 2023-03-25 MED ORDER — INSULIN ASPART 100 UNIT/ML IJ SOLN
0.0000 [IU] | Freq: Three times a day (TID) | INTRAMUSCULAR | Status: DC
  Administered 2023-03-26: 3 [IU] via SUBCUTANEOUS

## 2023-03-25 MED ORDER — CLOPIDOGREL BISULFATE 75 MG PO TABS
75.0000 mg | ORAL_TABLET | Freq: Every day | ORAL | Status: DC
Start: 2023-03-26 — End: 2023-03-27
  Administered 2023-03-26 – 2023-03-27 (×2): 75 mg via ORAL
  Filled 2023-03-25 (×2): qty 1

## 2023-03-25 MED ORDER — IRBESARTAN 75 MG PO TABS
75.0000 mg | ORAL_TABLET | Freq: Every day | ORAL | Status: DC
  Administered 2023-03-26 – 2023-03-27 (×2): 75 mg via ORAL
  Filled 2023-03-25 (×2): qty 1

## 2023-03-25 MED ORDER — ACETAMINOPHEN 160 MG/5ML PO SOLN
650.0000 mg | ORAL | Status: DC | PRN
  Administered 2023-03-27: 650 mg
  Filled 2023-03-25: qty 20.3

## 2023-03-25 MED ORDER — ONDANSETRON HCL 4 MG/2ML IJ SOLN: 4.0000 mg | Freq: Four times a day (QID) | INTRAMUSCULAR | Status: DC | PRN

## 2023-03-25 MED ORDER — STROKE: EARLY STAGES OF RECOVERY BOOK
Freq: Once | Status: AC
Start: 2023-03-26 — End: 2023-03-26
  Filled 2023-03-25: qty 1

## 2023-03-25 MED ORDER — SENNOSIDES-DOCUSATE SODIUM 8.6-50 MG PO TABS
1.0000 | ORAL_TABLET | Freq: Every evening | ORAL | Status: DC | PRN
  Administered 2023-03-26: 1 via ORAL
  Filled 2023-03-25: qty 1

## 2023-03-25 MED ORDER — ASPIRIN 81 MG PO TBEC
81.0000 mg | DELAYED_RELEASE_TABLET | Freq: Every day | ORAL | Status: DC
Start: 2023-03-25 — End: 2023-03-27
  Administered 2023-03-25 – 2023-03-27 (×3): 81 mg via ORAL
  Filled 2023-03-25 (×3): qty 1

## 2023-03-25 MED ORDER — ACETAMINOPHEN 650 MG RE SUPP: 650.0000 mg | RECTAL | Status: DC | PRN

## 2023-03-25 MED ORDER — GADOBUTROL 1 MMOL/ML IV SOLN
7.0000 mL | Freq: Once | INTRAVENOUS | Status: AC | PRN
  Administered 2023-03-25: 7 mL via INTRAVENOUS

## 2023-03-25 MED ORDER — ENOXAPARIN SODIUM 40 MG/0.4ML IJ SOSY
40.0000 mg | PREFILLED_SYRINGE | INTRAMUSCULAR | Status: DC
  Administered 2023-03-25: 40 mg via SUBCUTANEOUS
  Filled 2023-03-25: qty 0.4

## 2023-03-25 NOTE — ED Notes (Signed)
 Patient came out to nursing station yelling that we have not given him anything to eat or drink, and that he has been waiting all day. He stated that he wanted his MRI results and to speak with his doctor. I advised that we did not have his results yet, and that the doctor was busy. Patient became hateful with staff, so we advised that if he wants/ needs something that he has to ask, that we cannot read minds of peoples needs. Patient remains standing at door to room, provider notified patient wishes to speak with him.

## 2023-03-25 NOTE — Consult Note (Addendum)
 NEUROLOGY CONSULT NOTE   Date of service: March 25, 2023 Patient Name: Timothy Brewer MRN:  [id number] DOB:  1940/04/18 Chief Complaint: "R hand numbness" Requesting Provider: Kenny Peals, MD  History of Present Illness  Timothy Brewer is a 83 y.o. male with history of diabetes, hypertension, prior stroke who presents with sudden onset right hand and right arm numbness.  He reports that his symptoms started around 1800 on 03/24/2023.  Started with some numbness in his fingertips on his right hand and immediately went up after the shoulder.  He feels that his numbness is still persistent so he came into the ED where he had MRI brain without contrast which demonstrated a small left thalamic stroke.  He quit smoking about 6 years ago, reports his last A1c was about 6.7.  He is compliant with his atorvastatin .  No family history of strokes.  He does not use any recreational substances.  LKW: 1800 on 03/24/23. Modified rankin score: 0-Completely asymptomatic and back to baseline post- stroke IV Thrombolysis: Not offered as he is too mild to treat.  And outside of window.   EVT: Not offered, low suspicion for LVO and too mild to treat.  NIHSS components Score: Comment  1a Level of Conscious 0[x]  1[]  2[]  3[]      1b LOC Questions 0[x]  1[]  2[]       1c LOC Commands 0[x]  1[]  2[]       2 Best Gaze 0[x]  1[]  2[]       3 Visual 0[x]  1[]  2[]  3[]      4 Facial Palsy 0[x]  1[]  2[]  3[]      5a Motor Arm - left 0[x]  1[]  2[]  3[]  4[]  UN[]    5b Motor Arm - Right 0[x]  1[]  2[]  3[]  4[]  UN[]    6a Motor Leg - Left 0[x]  1[]  2[]  3[]  4[]  UN[]    6b Motor Leg - Right 0[x]  1[]  2[]  3[]  4[]  UN[]    7 Limb Ataxia 0[x]  1[]  2[]  3[]  UN[]     8 Sensory 0[x]  1[]  2[]  UN[]      9 Best Language 0[x]  1[]  2[]  3[]      10 Dysarthria 0[x]  1[]  2[]  UN[]      11 Extinct. 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 His smoking use included cigarettes. He started smoking about 10 years ago. He has a 4 pack-year smoking history. He has never used smokeless tobacco. He reports current alcohol use of about 1.0 standard drink of alcohol per week. He reports  that he does not use drugs.  Allergies  Allergen Reactions   Brilinta  [Ticagrelor ] Shortness Of Breath   Pantoprazole  Hives   Rosuvastatin Other (See Comments)    Other Reaction(s): memory fog   Iodine Hives and Other (See Comments)    Internal only    Medications   Current Facility-Administered Medications:    [START ON 03/26/2023]  stroke: early stages of recovery book, , Does not apply, Once, Kenny Peals, MD   acetaminophen  (TYLENOL ) tablet 650 mg, 650 mg, Oral, Q4H PRN **OR** acetaminophen  (TYLENOL ) 160 MG/5ML solution 650 mg, 650 mg, Per Tube, Q4H PRN **OR** acetaminophen  (TYLENOL ) suppository 650 mg, 650 mg, Rectal, Q4H PRN, Kenny Peals, MD   aspirin  EC tablet 81 mg, 81 mg, Oral, Daily, Lydia Sams, Vishal R, MD   [START ON 03/26/2023] atorvastatin  (LIPITOR) tablet 20 mg, 20 mg, Oral, q1800, Patel, Vishal R, MD   [START ON 03/26/2023] clopidogrel  (PLAVIX ) tablet 75 mg, 75 mg, Oral, Daily, Patel, Vishal R, MD   enoxaparin  (LOVENOX ) injection 40 mg, 40 mg,  Subcutaneous, Q24H, Patel, Vishal R, MD   [START ON 03/26/2023] insulin  aspart (novoLOG ) injection 0-9 Units, 0-9 Units, Subcutaneous, TID WC, Patel, Vishal R, MD   [START ON 03/26/2023] irbesartan  (AVAPRO ) tablet 75 mg, 75 mg, Oral, Daily, Patel, Vishal R, MD   ondansetron  (ZOFRAN ) injection 4 mg, 4 mg, Intravenous, Q6H PRN, Patel, Vishal R, MD   senna-docusate (Senokot-S) tablet 1 tablet, 1 tablet, Oral, QHS PRN, Patel, Vishal R, MD  Current Outpatient Medications:    acetic acid 2 % otic solution, Place 4 drops into both ears as needed (itching)., Disp: , Rfl:    Apoaequorin (PREVAGEN) 10 MG CAPS, Take 1 capsule by mouth daily., Disp: , Rfl:    atorvastatin  (LIPITOR) 20 MG tablet, Take 1 tablet (20 mg total) by mouth daily at 6 PM., Disp: 30 tablet, Rfl: 0   Calcium  Carb-Cholecalciferol (CALCIUM  600 + D PO), Take 1 tablet by mouth every evening., Disp: , Rfl:    cetirizine (ZYRTEC) 10 MG tablet, Take 10 mg by mouth at bedtime., Disp: , Rfl:    cholecalciferol (VITAMIN D3) 25 MCG (1000 UNIT) tablet, Take 1,000 Units by mouth at bedtime., Disp: , Rfl:    clopidogrel  (PLAVIX ) 75 MG tablet, TAKE 1 TABLET BY MOUTH EVERY DAY, Disp: 90 tablet, Rfl: 3   Efinaconazole  (JUBLIA ) 10 % SOLN, Apply 1 application  topically at bedtime. Apply each night at bedtime over coat remove after 7 days and restart, Disp: , Rfl:    Ferrous Sulfate  (IRON) 325 (65 Fe) MG TABS, Take 325 mg by mouth at bedtime., Disp: , Rfl:    loratadine  (CLARITIN ) 10 MG tablet, Take 10 mg by mouth in the morning., Disp: , Rfl:    magnesium  oxide (MAG-OX) 400 MG tablet, Take 400 mg by mouth at bedtime., Disp: , Rfl:    metFORMIN  (GLUCOPHAGE -XR) 500 MG 24 hr tablet, Take 1 tablet (500 mg total) by mouth 2 (two) times daily., Disp: , Rfl:    Multiple Vitamins-Minerals (PRESERVISION AREDS PO), Take 1 tablet by mouth 2 (two) times daily., Disp: , Rfl:    neomycin-polymyxin b-dexamethasone  (MAXITROL) 3.5-10000-0.1 SUSP, Place 3 drops into both  eyes as needed (itching)., Disp: , Rfl:    OVER THE COUNTER MEDICATION, Take 2 capsules by mouth daily. Emma stool softener, Disp: , Rfl:    sildenafil (REVATIO) 20 MG tablet, Take 100 mg by mouth daily as needed (erectile dysfunction)., Disp: , Rfl:  valsartan  (DIOVAN ) 40 MG tablet, Take 40 mg by mouth daily., Disp: , Rfl:    vitamin B-12 (CYANOCOBALAMIN ) 500 MCG tablet, Take 500 mcg by mouth every evening., Disp: , Rfl:    ciclopirox  (PENLAC ) 8 % solution, Apply topically at bedtime. Apply over nail and surrounding skin. Apply daily over previous coat. Remove weekly with polish remover. (Patient not taking: Reported on 11-Apr-2023), Disp: 6.6 mL, Rfl: 11  Vitals   Vitals:   04/11/2023 1400 2023-04-11 1500 11-Apr-2023 1530 April 11, 2023 1653  BP: (!) 166/85 (!) 177/85 (!) 145/70   Pulse: 80 89 66   Resp: (!) 21 19 16    Temp:    98 F (36.7 C)  TempSrc:    Oral  SpO2: 99% 97% 96%     There is no height or weight on file to calculate BMI.  Physical Exam  General: Laying comfortably in bed; in no acute distress.  HENT: Normal oropharynx and mucosa. Normal external appearance of ears and nose.  Neck: Supple, no pain or tenderness  CV: No JVD. No peripheral edema.  Pulmonary: Symmetric Chest rise. Normal respiratory effort.  Abdomen: Soft to touch, non-tender.  Ext: No cyanosis, edema, or deformity  Skin: No rash. Normal palpation of skin.   Musculoskeletal: Normal digits and nails by inspection. No clubbing.   Neurologic Examination  Mental status/Cognition: Alert, oriented to self, place, month and year, good attention.  Speech/language: Fluent, comprehension intact, object naming intact, repetition intact.  Cranial nerves:   CN II Pupils equal and reactive to light, no VF deficits    CN III,IV,VI EOM intact, no gaze preference or deviation, no nystagmus    CN V normal sensation in V1, V2, and V3 segments bilaterally    CN VII no asymmetry, no nasolabial fold flattening    CN VIII normal  hearing to speech    CN IX & X normal palatal elevation, no uvular deviation    CN XI 5/5 head turn and 5/5 shoulder shrug bilaterally    CN XII midline tongue protrusion    Motor:  Muscle bulk: Normal, tone normal, pronator drift none tremor none Mvmt Root Nerve  Muscle Right Left Comments  SA C5/6 Ax Deltoid 5 5   EF C5/6 Mc Biceps 5 5   EE C6/7/8 Rad Triceps 5 5   WF C6/7 Med FCR     WE C7/8 PIN ECU     F Ab C8/T1 U ADM/FDI 5 5   HF L1/2/3 Fem Illopsoas 5 5   KE L2/3/4 Fem Quad 5 5   DF L4/5 D Peron Tib Ant 5 5   PF S1/2 Tibial Grc/Sol 5 5    Sensation:  Light touch Intact throughout.   Pin prick    Temperature    Vibration   Proprioception    Coordination/Complex Motor:  - Finger to Nose intact bilaterally - Heel to shin intact bilaterally - Rapid alternating movement are normal - Gait: Deferred for patient safety.  Labs/Imaging/Neurodiagnostic studies   CBC:  Recent Labs  Lab 2023-04-11 1215 April 11, 2023 1221  WBC 6.6  --   NEUTROABS 3.9  --   HGB 14.1 15.0  HCT 42.2 44.0  MCV 96.3  --   PLT 310  --    Basic Metabolic Panel:  Lab Results  Component Value Date   NA 138 04-11-23   K 4.4 04-11-2023   CO2 24 04/11/23   GLUCOSE 89 2023-04-11   BUN 26 (H) 04/11/2023   CREATININE 1.20 04-11-2023  CALCIUM  9.5 03/25/2023   GFRNONAA >60 03/25/2023   GFRAA >60 07/10/2017   Lipid Panel:  Lab Results  Component Value Date   LDLCALC 72 09/12/2020   HgbA1c:  Lab Results  Component Value Date   HGBA1C 6.0 (H) 09/12/2020   Urine Drug Screen:     Component Value Date/Time   LABOPIA NONE DETECTED 06/07/2017 1137   COCAINSCRNUR NONE DETECTED 06/07/2017 1137   LABBENZ NONE DETECTED 06/07/2017 1137   AMPHETMU NONE DETECTED 06/07/2017 1137   THCU NONE DETECTED 06/07/2017 1137   LABBARB NONE DETECTED 06/07/2017 1137    Alcohol Level     Component Value Date/Time   ETH <10 03/25/2023 1215   INR  Lab Results  Component Value Date   INR 0.9 03/25/2023    APTT  Lab Results  Component Value Date   APTT 27 09/11/2020   AED levels: No results found for: "PHENYTOIN", "ZONISAMIDE", "LAMOTRIGINE", "LEVETIRACETA"  CT Head without contrast(Personally reviewed): CTH was negative for a large hypodensity concerning for a large territory infarct or hyperdensity concerning for an ICH   MRA head and neck: pending  MRI Brain(Personally reviewed): Small left thalamic stroke.  ASSESSMENT   Timothy Brewer is a 83 y.o. male with history of diabetes, hypertension, hyperlipidemia, former smoker who presents with right arm numbness and found to have a small left thalamic stroke.  Suspect etiology of stroke is likely small vessel disease.  RECOMMENDATIONS  - Frequent Neuro checks per stroke unit protocol - Recommend Vascular imaging with MRA head and neck(allergic to iodinated contrast) - Recommend obtaining TTE  - Recommend obtaining Lipid panel with LDL - Please start statin if LDL > 70 - Recommend HbA1c to evaluate for diabetes and how well it is controlled. - Antithrombotic -aspirin  81 mg daily along with Plavix  75 mg daily for 21 days, followed by aspirin  81 mg daily alone. - Recommend DVT ppx - SBP goal - permissive hypertension first 24 h < 220/110. Held home meds.  - Recommend Telemetry monitoring for arrythmia - Recommend bedside swallow screen prior to PO intake. - Stroke education booklet - Recommend PT/OT/SLP consult   ______________________________________________________________________    Signed, Yeilin Zweber, MD [facility]

## 2023-03-25 NOTE — ED Provider Notes (Signed)
EMERGENCY DEPARTMENT AT [facility] Provider Note   CSN: [id number] Arrival date & time: 03/25/23  1147     History  Chief Complaint  Patient presents with   Numbness    Timothy Brewer is a 83 y.o. male.  HPI   Patient has a history of hypertension diabetes stroke TIA who presents to the ED for evaluation of numbness.  Patient states he started noticing symptoms last evening.  This started around 6 PM.  Patient has noticed numbness on the right side including his arm and leg.  Patient states he also feels like his balance is a bit off.  He has not noticed any focal weakness.  No trouble with his vision or speech  Home Medications Prior to Admission medications   Medication Sig Start Date End Date Taking? Authorizing Provider  acetic acid 2 % otic solution Place 3 drops into both ears 3 (three) times daily. 09/04/22   [provider]  Apoaequorin (PREVAGEN) 10 MG CAPS Take 1 capsule by mouth daily.    [provider]  atorvastatin (LIPITOR) 20 MG tablet Take 1 tablet (20 mg total) by mouth daily at 6 PM. 06/08/17   Regalado, Belkys A, MD  Calcium Carb-Cholecalciferol (CALCIUM 600 + D PO) Take 1 tablet by mouth daily.     [provider]  calcium carbonate (SUPER CALCIUM) 1500 (600 Ca) MG TABS tablet Take 1,500 mg by mouth daily with breakfast.    [provider]  cetirizine (ZYRTEC) 10 MG tablet Take 10 mg by mouth daily. 08/04/17   [provider]  cholecalciferol (VITAMIN D3) 25 MCG (1000 UNIT) tablet Take 1,000 Units by mouth daily. 08/04/17   [provider]  ciclopirox (PENLAC) 8 % solution Apply topically at bedtime. Apply over nail and surrounding skin. Apply daily over previous coat. Remove weekly with polish remover. 01/21/23   McCaughan, Dia D, DPM  clopidogrel (PLAVIX) 75 MG tablet TAKE 1 TABLET BY MOUTH EVERY DAY 07/21/22   Micki Riley, MD  Ferrous Sulfate (IRON) 325 (65 Fe) MG TABS Take 1 tablet by  mouth daily.    [provider]  magnesium oxide (MAG-OX) 400 MG tablet Take 400 mg by mouth daily.    [provider]  metFORMIN (GLUCOPHAGE-XR) 500 MG 24 hr tablet Take 1 tablet (500 mg total) by mouth 2 (two) times daily. 06/05/20   Joseph Art, DO  Multiple Vitamins-Minerals (PRESERVISION AREDS PO) Take by mouth 2 (two) times daily.    [provider]  neomycin-polymyxin b-dexamethasone (MAXITROL) 3.5-10000-0.1 SUSP Place 2 drops into both eyes every 4 (four) hours. 11/19/22   [provider]  sildenafil (REVATIO) 20 MG tablet Take 40-100 mg by mouth daily as needed (erectile dysfunction). 01/19/19   [provider]  valsartan (DIOVAN) 40 MG tablet Take 40 mg by mouth daily.    [provider]  vitamin B-12 (CYANOCOBALAMIN) 500 MCG tablet Take 500 mcg by mouth daily.    [provider]      Allergies    Brilinta [ticagrelor], Pantoprazole, Rosuvastatin, and Iodine    Review of Systems   Review of Systems  Physical Exam Updated Vital Signs BP (!) 167/78 (BP Location: Right Arm)   Pulse 67   Temp 98.2 F (36.8 C) (Oral)   Resp 18   SpO2 99%  Physical Exam Vitals and nursing note reviewed.  Constitutional:      General: He is not in acute distress.  Appearance: He is well-developed.  HENT:     Head: Normocephalic and atraumatic.     Right Ear: External ear normal.     Left Ear: External ear normal.  Eyes:     General: No visual field deficit or scleral icterus.       Right eye: No discharge.        Left eye: No discharge.     Conjunctiva/sclera: Conjunctivae normal.  Neck:     Trachea: No tracheal deviation.  Cardiovascular:     Rate and Rhythm: Normal rate and regular rhythm.  Pulmonary:     Effort: Pulmonary effort is normal. No respiratory distress.     Breath sounds: Normal breath sounds. No stridor. No wheezing or rales.  Abdominal:     General: Bowel sounds are normal. There is no distension.      Palpations: Abdomen is soft.     Tenderness: There is no abdominal tenderness. There is no guarding or rebound.  Musculoskeletal:        General: No tenderness.     Cervical back: Neck supple.  Skin:    General: Skin is warm and dry.     Findings: No rash.  Neurological:     Mental Status: He is alert and oriented to person, place, and time.     Cranial Nerves: No cranial nerve deficit, dysarthria or facial asymmetry.     Sensory: No sensory deficit.     Motor: No abnormal muscle tone, seizure activity or pronator drift.     Coordination: Coordination normal.     Comments:  able to hold both legs off bed for 5 seconds, sensation intact in all extremities,  no left or right sided neglect, normal finger-nose exam bilaterally, no nystagmus noted; sensation intact although he states it feels different than normal on the right   Psychiatric:        Mood and Affect: Mood normal.     ED Results / Procedures / Treatments   Labs (all labs ordered are listed, but only abnormal results are displayed) Labs Reviewed  CBC - Abnormal; Notable for the following components:      Result Value   RBC 3.92 (*)    Hemoglobin 12.6 (*)    HCT 37.4 (*)    All other components within normal limits  BASIC METABOLIC PANEL - Abnormal; Notable for the following components:   CO2 20 (*)    BUN 25 (*)    Calcium 8.4 (*)    All other components within normal limits  I-STAT CHEM 8, ED - Abnormal; Notable for the following components:   BUN 26 (*)    Calcium, Ion 1.12 (*)    All other components within normal limits  ETHANOL  PROTIME-INR  CBC  DIFFERENTIAL  COMPREHENSIVE METABOLIC PANEL  LIPID PANEL  HEMOGLOBIN A1C    EKG EKG Interpretation Date/Time:  Wednesday March 25 2023 12:01:22 EST Ventricular Rate:  72 PR Interval:  156 QRS Duration:  114 QT Interval:  428 QTC Calculation: 469 R Axis:   -77  Text Interpretation: Sinus rhythm Left anterior fascicular block Abnormal R-wave  progression, late transition No significant change since last tracing Confirmed by Linwood Dibbles [phone]) on 03/25/2023 12:10:32 PM  Radiology No results found.  Procedures Procedures    Medications Ordered in ED Medications - No data to display  ED Course/ Medical Decision Making/ A&P Clinical Course as of 03/26/23 0707  Wed Mar 25, 2023  1423 CBC metabolic panel normal. [JK]  1424 Head CT does not show any acute abnormality. [JK]  1427 Will proceed with MRI to rule out occult stroke [JK]  1559 Case turned over to Dr Wallace Cullens at shift change [JK]  1810 Spoke with neuro, will see in eval [SG]    Clinical Course User Index [JK] Linwood Dibbles, MD [SG] Sloan Leiter, DO                                 Medical Decision Making Problems Addressed: Numbness: acute illness or injury that poses a threat to life or bodily functions  Amount and/or Complexity of Data Reviewed Labs: ordered. Decision-making details documented in ED Course. Radiology: ordered and independent interpretation performed.  Risk Decision regarding hospitalization.   Patient presents to the ED for evaluation of numbness and some balance disturbance.  Patient outside of tPA window.  LVO negative.  Initial head CT negative.  Concern for possible occult stroke not notable on head CT.  MRI was ordered.  Care turned over to Dr. Wallace Cullens at shift change        Final Clinical Impression(s) / ED Diagnoses Final diagnoses:  None    Rx / DC Orders ED Discharge Orders     None         Linwood Dibbles, MD 03/26/23 [phone]

## 2023-03-25 NOTE — ED Provider Notes (Signed)
  Provider Note MRN:  [id number]  Arrival date & time: 03/25/23    ED Course and Medical Decision Making  Assumed care from [place] at shift change.  See note from prior team for complete details, in brief:  83 yo male Reduced sensation RLE RUE since last night  Labs stable CTH stable MRI pending See's Dr [name] in office Has somewhat subjective sensation loss on the right.  Plan per prior physician f/u MRI   MRI is resulted, acute CVA, will engage neurology. Dr Alecia Ames. Plan admission  .Critical Care  Performed by: Teddi Favors, DO Authorized by: Teddi Favors, DO   Critical care provider statement:    Critical care time (minutes):  32   Critical care time was exclusive of:  Separately billable procedures and treating other patients   Critical care was necessary to treat or prevent imminent or life-threatening deterioration of the following conditions:  CNS failure or compromise   Critical care was time spent personally by me on the following activities:  Development of treatment plan with patient or surrogate, discussions with consultants, evaluation of patient's response to treatment, examination of patient, ordering and review of laboratory studies, ordering and review of radiographic studies, ordering and performing treatments and interventions, pulse oximetry, re-evaluation of patient's condition, review of old charts and obtaining history from patient or surrogate   Care discussed with: admitting provider     Final Clinical Impressions(s) / ED Diagnoses     ICD-10-CM   1. Numbness  R20.0     2. Cerebrovascular accident (CVA), unspecified mechanism [facility])  I63.9       ED Discharge Orders     None       Discharge Instructions   None        Teddi Favors, DO 03/25/23 1811

## 2023-03-25 NOTE — Hospital Course (Signed)
 Timothy Brewer is a 83 y.o. 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

## 2023-03-25 NOTE — ED Triage Notes (Signed)
 [facility] reports pt coming from doctors office for right arm and right leg numbness that started 1800 yesterday. Pt has history of TIA. Pt is on Plavix .

## 2023-03-25 NOTE — ED Notes (Signed)
 Patient transported to CT

## 2023-03-25 NOTE — H&P (Signed)
 History and Physical    Timothy Brewer XBJ:[id number] DOB: May 16, 1940 DOA: 03/25/2023  PCP: Rae Bugler, MD  Patient coming from: Home  I have personally briefly reviewed patient's old medical records in [facility]  Chief Complaint: Right-sided numbness  HPI: Timothy Brewer is a 83 y.o. 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  Patient reports around 1800 yesterday 03/24/2023 he developed numbness in the fingertips of his right hand.  This progressed to involving his whole right upper extremity and then his right lower extremity.  He did not have any associated weakness in his extremities.  No change in speech, headache, or change in vision.  He denied any recent chest pain, palpitations, dyspnea.  He was concerned about stroke therefore came to the ED for further evaluation.  Patient reports adherence to Plavix  75 mg daily.  ED Course  Labs/Imaging on admission: I have personally reviewed following labs and imaging studies.  Initial vitals showed BP 167/78, pulse 69, RR 16, temp 98.2 F, SpO2 100% on room air.  Labs showed WBC 6.6, hemoglobin 14.1, platelets 310,000, sodium 139, potassium 4.4, bicarb 24, BUN 21, creatinine 1.19, serum glucose 92, LFTs within normal limits.  CT head without contrast negative for evidence of acute intracranial abnormality.  MRI brain without contrast showed acute infarct in the left lateral thalamus.  EDP discussed with neurology who recommended medical admission.  The hospitalist service was consulted to admit.  Review of Systems: All systems reviewed and are negative except as documented in history of present illness above.   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 His smoking use included cigarettes. He started smoking about 10 years ago. He has a 4 pack-year smoking history. He has never used smokeless tobacco. He reports current alcohol use of about 1.0 standard drink of alcohol per week. He reports that he does not use drugs.  Allergies  Allergen Reactions   Brilinta  [Ticagrelor ] Shortness Of Breath   Pantoprazole  Hives   Rosuvastatin Other (See Comments)    Other Reaction(s): memory fog   Iodine Hives and Other (See Comments)    Internal only    Family History  Problem Relation Age of Onset   Stroke Maternal Grandmother      Prior to Admission medications   Medication Sig Start Date End Date Taking? Authorizing Provider  acetic acid 2 % otic solution Place 4 drops into both ears as needed (itching). 09/04/22  Yes [provider]  Apoaequorin (PREVAGEN) 10 MG CAPS Take 1 capsule by mouth daily.   Yes [provider]  atorvastatin  (LIPITOR) 20 MG tablet Take 1 tablet (20 mg total) by mouth  daily at 6 PM. 06/08/17  Yes Regalado, Belkys A, MD  Calcium  Carb-Cholecalciferol (CALCIUM  600 + D PO) Take 1 tablet by mouth every evening.   Yes [provider]  cetirizine (ZYRTEC) 10 MG tablet Take 10 mg by mouth at bedtime. 08/04/17  Yes [provider]  cholecalciferol (VITAMIN D3) 25 MCG (1000 UNIT) tablet Take 1,000 Units by mouth at bedtime. 08/04/17  Yes [provider]  clopidogrel  (PLAVIX ) 75 MG tablet TAKE 1 TABLET BY MOUTH EVERY DAY 07/21/22  Yes Lisabeth Rider,  MD  Efinaconazole  (JUBLIA ) 10 % SOLN Apply 1 application  topically at bedtime. Apply each night at bedtime over coat remove after 7 days and restart   Yes [provider]  Ferrous Sulfate  (IRON) 325 (65 Fe) MG TABS Take 325 mg by mouth at bedtime.   Yes [provider]  loratadine  (CLARITIN ) 10 MG tablet Take 10 mg by mouth in the morning.   Yes [provider]  magnesium  oxide (MAG-OX) 400 MG tablet Take 400 mg by mouth at bedtime.   Yes [provider]  metFORMIN  (GLUCOPHAGE -XR) 500 MG 24 hr tablet Take 1 tablet (500 mg total) by mouth 2 (two) times daily. 06/05/20  Yes Vann, Jessica U, DO  Multiple Vitamins-Minerals (PRESERVISION AREDS PO) Take 1 tablet by mouth 2 (two) times daily.   Yes [provider]  neomycin-polymyxin b-dexamethasone  (MAXITROL) 3.5-10000-0.1 SUSP Place 3 drops into both eyes as needed (itching). 11/19/22  Yes [provider]  OVER THE COUNTER MEDICATION Take 2 capsules by mouth daily. Emma stool softener   Yes [provider]  sildenafil (REVATIO) 20 MG tablet Take 100 mg by mouth daily as needed (erectile dysfunction). 01/19/19  Yes [provider]  valsartan  (DIOVAN ) 40 MG tablet Take 40 mg by mouth daily.   Yes [provider]  vitamin B-12 (CYANOCOBALAMIN ) 500 MCG tablet Take 500 mcg by mouth every evening.   Yes [provider]  ciclopirox  (PENLAC ) 8 % solution Apply topically at bedtime. Apply over nail and surrounding skin. Apply daily over previous coat. Remove weekly with polish remover. Patient not taking: Reported on 03/25/2023 01/21/23   Joe Murders, DPM    Physical Exam: Vitals:   03/25/23 1500 03/25/23 1530 03/25/23 1653 03/25/23 2130  BP: (!) 177/85 (!) 145/70  (!) 143/67  Pulse: 89 66  86  Resp: 19 16  17   Temp:   98 F (36.7 C) 97.7 F (36.5 C)  TempSrc:   Oral Oral  SpO2: 97% 96%  99%   Constitutional: Resting in bed, NAD, calm, comfortable Eyes: EOMI,  lids and conjunctivae normal ENMT: Mucous membranes are moist. Posterior pharynx clear of any exudate or lesions.Normal dentition.  Neck: normal, supple, no masses. Respiratory: clear to auscultation bilaterally, no wheezing, no crackles. Normal respiratory effort. No accessory muscle use.  Cardiovascular: Regular rate and rhythm, no murmurs / rubs / gallops. No extremity edema. 2+ pedal pulses. Abdomen: no tenderness, no masses palpated.  Musculoskeletal: no clubbing / cyanosis. No joint deformity upper and lower extremities. Good ROM, no contractures. Normal muscle tone.  Skin: no rashes, lesions, ulcers. No induration Neurologic: CN 2-12 grossly intact. Sensation intact. Strength 5/5 in all 4.  Psychiatric: Normal judgment and insight. Alert and oriented x 3. Normal mood.   EKG: Personally reviewed. Sinus rhythm, rate 72, LAFB, no acute ischemic changes.  Assessment/Plan Principal Problem:   Acute CVA (cerebrovascular accident) [facility]) Active Problems:   Hypertension associated with diabetes (HCC)  Hyperlipidemia associated with type 2 diabetes mellitus (HCC)   Type 2 diabetes mellitus (HCC)   Timothy Brewer is a 83 y.o. 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  Assessment and Plan: Acute left lateral thalamic infarct: Noted on MRI brain.  Presented with right-sided numbness. -Neurology following -Obtain TTE -Follow MRA head/neck -Started on aspirin  81 mg daily and Plavix  75 mg daily for 21 days then aspirin  alone -Keep on telemetry, continue neurochecks -PT/OT/SLP eval -Check A1c, lipid panel  Type 2 diabetes: Holding metformin .  Placed on SSI.  Hypertension: Resume home valsartan  tomorrow.  Hyperlipidemia: Continue atorvastatin  20 mg daily.  Follow lipid panel.    DVT prophylaxis: enoxaparin  (LOVENOX ) injection 40 mg Start: 03/25/23  2100 Code Status:   Code Status: Do not attempt resuscitation (DNR) PRE-ARREST INTERVENTIONS DESIRED confirmed with patient on admission. Family Communication: Discussed with patient, he has discussed with family Disposition Plan: From home and likely discharge to home pending clinical progress Consults called: Neurology Severity of Illness: The appropriate patient status for this patient is OBSERVATION. Observation status is judged to be reasonable and necessary in order to provide the required intensity of service to ensure the patient's safety. The patient's presenting symptoms, physical exam findings, and initial radiographic and laboratory data in the context of their medical condition is felt to place them at decreased risk for further clinical deterioration. Furthermore, it is anticipated that the patient will be medically stable for discharge from the hospital within 2 midnights of admission.   Edith Gores MD [facility]  If 7PM-7AM, please contact night-coverage [email or web address]  03/25/2023, 11:45 PM

## 2023-03-26 ENCOUNTER — Observation Stay (HOSPITAL_COMMUNITY): Payer: PPO

## 2023-03-26 DIAGNOSIS — I6389 Other cerebral infarction: Secondary | ICD-10-CM | POA: Diagnosis not present

## 2023-03-26 DIAGNOSIS — I639 Cerebral infarction, unspecified: Secondary | ICD-10-CM | POA: Diagnosis not present

## 2023-03-26 LAB — BASIC METABOLIC PANEL
Anion gap: 9 (ref 5–15)
BUN: 25 mg/dL — ABNORMAL HIGH (ref 8–23)
CO2: 20 mmol/L — ABNORMAL LOW (ref 22–32)
Calcium: 8.4 mg/dL — ABNORMAL LOW (ref 8.9–10.3)
Chloride: 107 mmol/L (ref 98–111)
Creatinine, Ser: 1.13 mg/dL (ref 0.61–1.24)
GFR, Estimated: >60 mL/min (ref >60)
Glucose, Bld: 93 mg/dL (ref 70–99)
Potassium: 4.4 mmol/L (ref 3.5–5.1)
Sodium: 136 mmol/L (ref 135–145)

## 2023-03-26 LAB — LIPID PANEL
Cholesterol: 156 mg/dL (ref 0–200)
HDL: 70 mg/dL (ref >40)
LDL Cholesterol: 72 mg/dL (ref 0–99)
Total CHOL/HDL Ratio: 2.2 {ratio}
Triglycerides: 68 mg/dL (ref <150)
VLDL: 14 mg/dL (ref 0–40)

## 2023-03-26 LAB — CBC
HCT: 37.4 % — ABNORMAL LOW (ref 39.0–52.0)
Hemoglobin: 12.6 g/dL — ABNORMAL LOW (ref 13.0–17.0)
MCH: 32.1 pg (ref 26.0–34.0)
MCHC: 33.7 g/dL (ref 30.0–36.0)
MCV: 95.4 fL (ref 80.0–100.0)
Platelets: 268 10*3/uL (ref 150–400)
RBC: 3.92 MIL/uL — ABNORMAL LOW (ref 4.22–5.81)
RDW: 13.6 % (ref 11.5–15.5)
WBC: 6.8 10*3/uL (ref 4.0–10.5)
nRBC: 0 % (ref 0.0–0.2)

## 2023-03-26 LAB — HEMOGLOBIN A1C
Hgb A1c MFr Bld: 5.6 % (ref 4.8–5.6)
Mean Plasma Glucose: 114.02 mg/dL

## 2023-03-26 LAB — ECHOCARDIOGRAM COMPLETE
Area-P 1/2: 2.62 cm2
Calc EF: 50.4 %
S' Lateral: 3.7 cm
Single Plane A2C EF: 44.9 %
Single Plane A4C EF: 54.7 %

## 2023-03-26 LAB — GLUCOSE, CAPILLARY
Glucose-Capillary: 92 mg/dL (ref 70–99)
Glucose-Capillary: 100 mg/dL — ABNORMAL HIGH (ref 70–99)

## 2023-03-26 LAB — CBG MONITORING, ED
Glucose-Capillary: 216 mg/dL — ABNORMAL HIGH (ref 70–99)
Glucose-Capillary: 68 mg/dL — ABNORMAL LOW (ref 70–99)

## 2023-03-26 MED ORDER — STUDY - OCEANIC-STROKE - ASUNDEXIAN 50 MG OR PLACEBO TABLET (PI-SETHI)
1.0000 | ORAL_TABLET | Freq: Every day | ORAL | Status: DC
  Administered 2023-03-26 – 2023-03-27 (×2): 50 mg via ORAL
  Filled 2023-03-26 (×2): qty 1

## 2023-03-26 MED ORDER — ATORVASTATIN CALCIUM 40 MG PO TABS
40.0000 mg | ORAL_TABLET | Freq: Every day | ORAL | Status: DC
  Administered 2023-03-26: 40 mg via ORAL
  Filled 2023-03-26: qty 1

## 2023-03-26 MED ORDER — POLYETHYLENE GLYCOL 3350 17 G PO PACK
17.0000 g | PACK | Freq: Every day | ORAL | Status: DC
  Administered 2023-03-26: 17 g via ORAL
  Filled 2023-03-26: qty 1

## 2023-03-26 NOTE — Progress Notes (Signed)
PROGRESS NOTE    Timothy Brewer  LKG:[id number] DOB: Jul 30, 1940 DOA: 03/25/2023 PCP: [name], MD    Brief Narrative:  83 year old gentleman with history of TIA on Plavix, type 2 diabetes, hypertension presented to the emergency room with right-sided hand numbness.  This started from tip of the finger and progressed to involve the whole right upper extremity then his right lower extremity.  In the emergency room stroke alert was called.  Minimal symptoms.  Not a tPA candidate.  Admitted with neurology consultation.  Subjective: Patient seen in the emergency room.  Still has some tingling but no numbness.  Patient complained of having more issues with constipation today.  Mobilized around with therapies and did well without assistance. Assessment & Plan:   Acute ischemic stroke: Clinical findings, numbness tingling right hand. CT head findings, negative for any acute findings. MRI of the brain, acute infarct in the left lateral thalamus. 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  No large vessel occlusion or high-grade stenosis. 2D echocardiogram, normal EF.  No intracardiac thrombus. Antiplatelet therapy, on Plavix at home.  Neurology recommending aspirin and Plavix daily. LDL 72.  Already on a statin. Hemoglobin A1c, 5.6.  Currently no indication for treatment. Followed by PT OT, recommended no further therapies. Neurology following, recommending monitoring overnight.  Hypertension: Blood pressure stable.  On valsartan 40 mg daily at home.  Blood pressure goal less than 220 today.  Currently not requiring any additional medications.  Hyperlipidemia: On atorvastatin 20 mg daily at home.  Increasing to 40. Controlled type 2 diabetes: A1c 5.6.  On metformin at home.  Resume on discharge.       DVT prophylaxis: enoxaparin (LOVENOX) injection 40 mg Start: 03/25/23 2100   Code Status: DNR with intervention Family  Communication: Wife at the bedside Disposition Plan: Status is: Observation The patient will require care spanning > 2 midnights and should be moved to inpatient because: Completing a stroke workup     Consultants:  Neurology  Procedures:  None  Antimicrobials:  None     Objective: Vitals:   03/26/23 1130 03/26/23 1415 03/26/23 1416 03/26/23 1430  BP: 137/85 138/68  97/74  Pulse: 71 89  83  Resp: 20 20    Temp:   (!) 97.5 F (36.4 C)   TempSrc:   Oral   SpO2: 97% 93%  93%    Intake/Output Summary (Last 24 hours) at 03/26/2023 1559 Last data filed at 03/26/2023 6644 Gross per 24 hour  Intake 680 ml  Output 550 ml  Net 130 ml   There were no vitals filed for this visit.  Examination:  General exam: Appears calm and comfortable  Respiratory system: Clear to auscultation. Respiratory effort normal. Cardiovascular system: S1 & S2 heard, RRR. No JVD, murmurs, rubs, gallops or clicks. No pedal edema. Gastrointestinal system: Abdomen is nondistended, soft and nontender. No organomegaly or masses felt. Normal bowel sounds heard. Central nervous system: Alert and oriented. No focal neurological deficits. No gross neurological deficits.    Data Reviewed: I have personally reviewed following labs and imaging studies  CBC: Recent Labs  Lab 03/25/23 1215 03/25/23 1221 03/26/23 0445  WBC 6.6  --  6.8  NEUTROABS 3.9  --   --   HGB 14.1 15.0 12.6*  HCT 42.2 44.0 37.4*  MCV 96.3  --  95.4  PLT 310  --  268   Basic Metabolic Panel: Recent Labs  Lab 03/25/23 1215  03/25/23 1221 03/26/23 0445  NA 139 138 136  K 4.4 4.4 4.4  CL 106 105 107  CO2 24  --  20*  GLUCOSE 92 89 93  BUN 21 26* 25*  CREATININE 1.19 1.20 1.13  CALCIUM 9.5  --  8.4*   GFR: CrCl cannot be calculated (Unknown ideal weight.). Liver Function Tests: Recent Labs  Lab 03/25/23 1215  AST 23  ALT 19  ALKPHOS 58  BILITOT 0.8  PROT 6.8  ALBUMIN 3.8   No results for input(s): "LIPASE",  "AMYLASE" in the last 168 hours. No results for input(s): "AMMONIA" in the last 168 hours. Coagulation Profile: Recent Labs  Lab 03/25/23 1215  INR 0.9   Cardiac Enzymes: No results for input(s): "CKTOTAL", "CKMB", "CKMBINDEX", "TROPONINI" in the last 168 hours. BNP (last 3 results) No results for input(s): "PROBNP" in the last 8760 hours. HbA1C: Recent Labs    03/26/23 0445  HGBA1C 5.6   CBG: Recent Labs  Lab 03/26/23 0836 03/26/23 1208  GLUCAP 216* 68*   Lipid Profile: Recent Labs    03/26/23 0445  CHOL 156  HDL 70  LDLCALC 72  TRIG 68  CHOLHDL 2.2   Thyroid Function Tests: No results for input(s): "TSH", "T4TOTAL", "FREET4", "T3FREE", "THYROIDAB" in the last 72 hours. Anemia Panel: No results for input(s): "VITAMINB12", "FOLATE", "FERRITIN", "TIBC", "IRON", "RETICCTPCT" in the last 72 hours. Sepsis Labs: No results for input(s): "PROCALCITON", "LATICACIDVEN" in the last 168 hours.  No results found for this or any previous visit (from the past 240 hours).       Radiology Studies: ECHOCARDIOGRAM COMPLETE Result Date: 03/26/2023    ECHOCARDIOGRAM REPORT   Patient Name:   Timothy Brewer Date of Exam: 03/26/2023 Medical Rec #:  [id number]       Height:       66.0 in Accession #:    [id number]      Weight:       165.8 lb Date of Birth:  [birth year]      BSA:          1.847 m Patient Age:    82 years        BP:           158/78 mmHg Patient Gender: M               HR:           67 bpm. Exam Location:  Inpatient Procedure: 2D Echo, Cardiac Doppler and Color Doppler Indications:    Stroke  History:        Patient has prior history of Echocardiogram examinations, most                 recent 06/07/2017. CHF, Stroke and TIA; Risk Factors:Hypertension                 and Dyslipidemia. History of negative bubble study.  Sonographer:    Sheralyn Boatman RDCS Referring Phys: [id number] VISHAL R PATEL  Sonographer Comments: Saline bubble study not repeated, patient had negative bubble study  with previous echo. IMPRESSIONS  1. Left ventricular ejection fraction, by estimation, is 50 to 55%. The left ventricle has low normal function. The left ventricle demonstrates global hypokinesis. There is mild left ventricular hypertrophy. Left ventricular diastolic parameters are consistent with Grade I diastolic dysfunction (impaired relaxation).  2. Right ventricular systolic function is normal. The right ventricular size is normal.  3. The mitral valve is degenerative. Trivial mitral valve regurgitation. No  evidence of mitral stenosis.  4. The aortic valve is tricuspid. Aortic valve regurgitation is not visualized. No aortic stenosis is present. FINDINGS  Left Ventricle: Left ventricular ejection fraction, by estimation, is 50 to 55%. The left ventricle has low normal function. The left ventricle demonstrates global hypokinesis. The left ventricular internal cavity size was normal in size. There is mild left ventricular hypertrophy. Left ventricular diastolic parameters are consistent with Grade I diastolic dysfunction (impaired relaxation). Right Ventricle: The right ventricular size is normal. No increase in right ventricular wall thickness. Right ventricular systolic function is normal. Left Atrium: Left atrial size was normal in size. Right Atrium: Right atrial size was normal in size. Pericardium: There is no evidence of pericardial effusion. Mitral Valve: The mitral valve is degenerative in appearance. Trivial mitral valve regurgitation. No evidence of mitral valve stenosis. Tricuspid Valve: The tricuspid valve is normal in structure. Tricuspid valve regurgitation is not demonstrated. No evidence of tricuspid stenosis. Aortic Valve: The aortic valve is tricuspid. Aortic valve regurgitation is not visualized. No aortic stenosis is present. Pulmonic Valve: The pulmonic valve was normal in structure. Pulmonic valve regurgitation is not visualized. No evidence of pulmonic stenosis. Aorta: The aortic root and  ascending aorta are structurally normal, with no evidence of dilitation. IAS/Shunts: No atrial level shunt detected by color flow Doppler.  LEFT VENTRICLE PLAX 2D LVIDd:         4.90 cm     Diastology LVIDs:         3.70 cm     LV e' medial:    5.98 cm/s LV PW:         1.30 cm     LV E/e' medial:  7.7 LV IVS:        1.10 cm     LV e' lateral:   8.92 cm/s LVOT diam:     2.30 cm     LV E/e' lateral: 5.1 LV SV:         62 LV SV Index:   34 LVOT Area:     4.15 cm  LV Volumes (MOD) LV vol d, MOD A2C: 91.3 ml LV vol d, MOD A4C: 86.2 ml LV vol s, MOD A2C: 50.3 ml LV vol s, MOD A4C: 39.1 ml LV SV MOD A2C:     41.0 ml LV SV MOD A4C:     86.2 ml LV SV MOD BP:      45.6 ml RIGHT VENTRICLE             IVC RV S prime:     10.90 cm/s  IVC diam: 1.80 cm TAPSE (M-mode): 1.8 cm LEFT ATRIUM             Index        RIGHT ATRIUM           Index LA diam:        3.80 cm 2.06 cm/m   RA Area:     12.40 cm LA Vol (A2C):   27.6 ml 14.95 ml/m  RA Volume:   28.30 ml  15.33 ml/m LA Vol (A4C):   19.7 ml 10.67 ml/m LA Biplane Vol: 23.4 ml 12.67 ml/m  AORTIC VALVE LVOT Vmax:   74.40 cm/s LVOT Vmean:  47.700 cm/s LVOT VTI:    0.149 m  AORTA Ao Root diam: 3.60 cm Ao Asc diam:  3.40 cm MITRAL VALVE MV Area (PHT): 2.62 cm    SHUNTS MV Decel Time: 289 msec    Systemic VTI:  0.15 m MV E velocity: 45.85 cm/s  Systemic Diam: 2.30 cm MV A velocity: 98.95 cm/s MV E/A ratio:  0.46 Clearnce Hasten Electronically signed by Clearnce Hasten Signature Date/Time: 03/26/2023/2:59:23 PM    Final    MR ANGIO HEAD WO CONTRAST Result Date: 03/26/2023 CLINICAL DATA:  Acute neurologic deficit EXAM: MRA NECK WITHOUT AND WITH CONTRAST MRA HEAD WITHOUT CONTRAST TECHNIQUE: Multiplanar and multiecho pulse sequences of the neck were obtained without and with intravenous contrast. Angiographic images of the neck were obtained using MRA technique without and with intravenous contrast; Angiographic images of the Circle of Willis were obtained using MRA technique  without intravenous contrast. CONTRAST:  7mL GADAVIST GADOBUTROL 1 MMOL/ML IV SOLN COMPARISON:  None Available. 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  Normal. Other: None. MRA HEAD FINDINGS POSTERIOR CIRCULATION: Vertebral arteries are normal. No proximal occlusion of the anterior or inferior cerebellar arteries. Basilar artery is normal. Superior cerebellar arteries are normal. Posterior cerebral arteries are normal. ANTERIOR CIRCULATION: Intracranial internal carotid arteries are normal. Anterior cerebral arteries are normal. Middle cerebral arteries are normal. Anatomic Variants: None Other: None. IMPRESSION: 1. 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 2. A 50% stenosis of the proximal left ICA. 3. No intracranial large vessel occlusion or high-grade stenosis. Electronically Signed   By: Deatra Robinson M.D.   On: 03/26/2023 01:37   MR ANGIO NECK W WO CONTRAST Result Date: 03/26/2023 CLINICAL DATA:  Acute neurologic deficit EXAM: MRA NECK WITHOUT AND WITH CONTRAST MRA HEAD WITHOUT CONTRAST TECHNIQUE: Multiplanar and multiecho pulse sequences of the neck were obtained without and with intravenous contrast. Angiographic images of the neck were obtained using MRA technique without and with intravenous contrast; Angiographic images of the Circle of Willis were obtained using MRA technique without intravenous contrast. CONTRAST:  7mL GADAVIST GADOBUTROL 1 MMOL/ML IV SOLN COMPARISON:  None Available. 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  Normal. Other: None. MRA HEAD FINDINGS POSTERIOR CIRCULATION: Vertebral arteries are normal. No proximal occlusion of the anterior or inferior cerebellar arteries.  Basilar artery is normal. Superior cerebellar arteries are normal. Posterior cerebral arteries are normal. ANTERIOR CIRCULATION: Intracranial internal carotid arteries are normal. Anterior cerebral arteries are normal. Middle cerebral arteries are normal. Anatomic Variants: None Other: None. IMPRESSION: 1. 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 2. A 50% stenosis of the proximal left ICA. 3. No intracranial large vessel occlusion or high-grade stenosis. Electronically Signed   By: Deatra Robinson M.D.   On: 03/26/2023 01:37   MR BRAIN WO CONTRAST Result Date: 03/25/2023 CLINICAL DATA:  Stroke suspected, right arm and leg numbness EXAM: MRI HEAD WITHOUT CONTRAST TECHNIQUE: Multiplanar, multiecho pulse sequences of the brain and surrounding structures were obtained without intravenous contrast. COMPARISON:  09/11/2020 MRI head, 03/25/2023 CT head FINDINGS: Brain: Restricted diffusion with ADC correlate in the left lateral thalamus (series 3, images 27-28). No acute hemorrhage, mass, mass effect, or midline shift. No hydrocephalus or extra-axial collection. No hemosiderin deposition to suggest remote hemorrhage. Remote lacunar infarcts in the right thalamus and right basal ganglia. Scattered and confluent T2 hyperintense signal in the periventricular white matter, likely the sequela of moderate chronic small vessel ischemic disease. Vascular: Normal arterial flow voids. Skull  and upper cervical spine: Normal marrow signal. Sinuses/Orbits: Mucosal thickening in the maxillary sinuses and ethmoid air cells. Status post bilateral lens replacements. No acute finding in the orbits. Other: Trace fluid in the left mastoid air cells. IMPRESSION: Acute infarct in the left lateral thalamus. These results were called by telephone at the time of interpretation on 03/25/2023 at 5:59 pm to provider GRAY , who verbally acknowledged these results. Electronically Signed   By: Wiliam Ke M.D.   On: 03/25/2023 18:01   CT HEAD WO  CONTRAST Result Date: 03/25/2023 CLINICAL DATA:  Provided history: Neuro deficit, acute, stroke suspected. Right arm and right leg numbness. History of TIA. EXAM: CT HEAD WITHOUT CONTRAST TECHNIQUE: Contiguous axial images were obtained from the base of the skull through the vertex without intravenous contrast. RADIATION DOSE REDUCTION: This exam was performed according to the departmental dose-optimization program which includes automated exposure control, adjustment of the mA and/or kV according to patient size and/or use of iterative reconstruction technique. COMPARISON:  Brain MRI 09/11/2020.  Head CT 09/11/2020. FINDINGS: Brain: Generalized cerebral atrophy. Patchy and ill-defined hypoattenuation within the cerebral white matter, nonspecific but compatible with moderate chronic small vessel ischemic disease. Chronic lacunar infarct again noted within the right thalamus. There is no acute intracranial hemorrhage. No demarcated cortical infarct. No extra-axial fluid collection. No evidence of an intracranial mass. No midline shift. Vascular: No hyperdense vessel.  Atherosclerotic calcifications. Skull: No calvarial fracture or aggressive osseous lesion. Sinuses/Orbits: No mass or acute finding within the imaged orbits. Mild-to-moderate mucosal thickening within the right maxillary sinus at the imaged levels. Small-volume frothy secretions, and moderate background mucosal thickening, within the left maxillary sinus at the imaged levels. Mild mucosal thickening within the bilateral sphenoid sinuses. IMPRESSION: 1. No evidence of an acute intracranial abnormality. 2. Parenchymal atrophy and chronic small vessel ischemic disease. 3. Paranasal sinus disease at the imaged levels as described. Electronically Signed   By: Jackey Loge D.O.   On: 03/25/2023 13:19        Scheduled Meds:  aspirin EC  81 mg Oral Daily   atorvastatin  40 mg Oral q1800   clopidogrel  75 mg Oral Daily   enoxaparin (LOVENOX)  injection  40 mg Subcutaneous Q24H   insulin aspart  0-9 Units Subcutaneous TID WC   irbesartan  75 mg Oral Daily   polyethylene glycol  17 g Oral Daily   OCEANIC-STROKE asundexian or placebo  1 tablet Oral Daily   Continuous Infusions:   LOS: 0 days    Time spent: 40 minutes    Dorcas Carrow, MD [facility]

## 2023-03-26 NOTE — Progress Notes (Signed)
  Echocardiogram 2D Echocardiogram has been performed.  Timothy Brewer 03/26/2023, 11:43 AM

## 2023-03-26 NOTE — Inpatient Diabetes Management (Signed)
Inpatient Diabetes Program Recommendations  AACE/ADA: New Consensus Statement on Inpatient Glycemic Control (2015)  Target Ranges:  Prepandial:   less than 140 mg/dL      Peak postprandial:   less than 180 mg/dL (1-2 hours)      Critically ill patients:  140 - 180 mg/dL   Lab Results  Component Value Date   GLUCAP 68 (L) 03/26/2023   HGBA1C 5.6 03/26/2023    Review of Glycemic Control  Latest Reference Range & Units 03/26/23 08:36 03/26/23 12:08  Glucose-Capillary 70 - 99 mg/dL 865 (H) 68 (L)   Diabetes history: DM 2 Outpatient Diabetes medications: Metformin 500 mg Daily Current orders for Inpatient glycemic control:  Novolog 0-9 units tid  Note: Hypoglycemia 68 today after Novolog 3 units was given for a glucose of 216.  A1c 5.6% on 1/16  Inpatient Diabetes Program Recommendations:    -   Reduce Novolog correction to "very sensitive" 0-6 units tid   Thanks,  Christena Deem RN, MSN, BC-ADM Inpatient Diabetes Coordinator Team Pager [phone] (8a-5p)

## 2023-03-26 NOTE — Evaluation (Signed)
Physical Therapy Evaluation Patient Details Name: Timothy Brewer MRN: [id number] DOB: Nov 08, 1940 Today's Date: 03/26/2023  History of Present Illness  Patient is 83 y.o. male  who presents with sudden onset Rt UE and LE numbness. MRI demonstrates small left thalamic stroke and MRA shows 70% stenosis of proximal right ICA, 50% stenosis of proximal left ICA. PMH significant for TIA, HTN, DM.   Clinical Impression  Timothy Brewer is 83 y.o. male admitted with above HPI and diagnosis. Patient is currently limited by functional impairments below (see PT problem list). Patient lives with spouse and is independent with no AD for mobility at baseline. Currently pt is mobilizing at mod ind/supervision level with no AD. No significant difference noted in Rt/Lt UE&LE strength with exception of Lt shoulder and pt has history of Lt RC injury. Pt ambulated ~250' with supervision and no over LOB, noted to have slight steppage gait on Rt and wide BOS due to impaired sensation in bil feet. Patient will benefit from continued skilled PT interventions to address impairments and progress independence with mobility, recommending follow up OPPT for balance and strengthening. Pt has not further acute PT needs and has been encouraged to mobilize with RN/NT staff during acute stay.      If plan is discharge home, recommend the following: Assist for transportation;Help with stairs or ramp for entrance   Can travel by private vehicle        Equipment Recommendations None recommended by PT  Recommendations for Other Services       Functional Status Assessment Patient has had a recent decline in their functional status and demonstrates the ability to make significant improvements in function in a reasonable and predictable amount of time.     Precautions / Restrictions Precautions Precautions: Fall Restrictions Weight Bearing Restrictions Per Provider Order: No      Mobility  Bed Mobility Overal bed  mobility: Independent                  Transfers Overall transfer level: Modified independent Equipment used: None               General transfer comment: use of UE's for rise and lower    Ambulation/Gait Ambulation/Gait assistance: Supervision Gait Distance (Feet): 250 Feet Assistive device: None Gait Pattern/deviations: Step-through pattern, Decreased stride length, Wide base of support (slight steppage on Rt LE) Gait velocity: decr     General Gait Details: overall mildly unsteady with Rt steppage secondary to impaired sensation in feet.  Stairs            Wheelchair Mobility     Tilt Bed    Modified Rankin (Stroke Patients Only)       Balance Overall balance assessment: Mild deficits observed, not formally tested                                           Pertinent Vitals/Pain Pain Assessment Pain Assessment: No/denies pain (just constipated)    Home Living Family/patient expects to be discharged to:: Private residence Living Arrangements: Spouse/significant other Available Help at Discharge: Family;Available 24 hours/day Type of Home: House Home Access: Level entry       Home Layout: One level Home Equipment: Rollator (4 wheels);Cane - single point;Shower seat      Prior Function Prior Level of Function : Independent/Modified Independent;Driving  Muscle bulk: normal, tone normal, pronator drift none tremor none  MMT: Mvmt Root Nerve  Muscle Right Left Comments  SA C5/6 Ax Deltoid 4 3 empty due to pain  EF C5/6 Mc Biceps 4 4    EE C6/7/8 Rad Triceps 4 4    WF C6/7 Med FCR 4  4    WE C7/8 PIN ECU  4  4    F Ab C8/T1 U ADM/FDI      HF L1/2/3 Fem Illopsoas 4 3 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   Extremity/Trunk Assessment   Upper Extremity Assessment Upper Extremity Assessment: Defer to OT evaluation RUE Deficits / Details: full AROM and strength RUE Sensation: decreased light touch RUE Coordination: WNL    Lower Extremity Assessment Lower Extremity Assessment:  (see details in note)    Cervical / Trunk Assessment Cervical / Trunk Assessment: Normal  Communication   Communication Communication: No apparent difficulties  Cognition Arousal: Alert Behavior During Therapy: WFL for tasks assessed/performed Overall Cognitive Status: Within Functional Limits for tasks assessed                                          General Comments      Exercises     Assessment/Plan    PT Assessment All further PT needs can be met in the next venue of care  PT Problem List Decreased strength;Decreased mobility;Decreased balance;Impaired sensation       PT Treatment Interventions      PT Goals (Current goals can be found in the Care Plan section)  Acute Rehab PT Goals Patient Stated Goal: recover and return home PT Goal Formulation: All assessment and education complete, DC therapy Time For Goal Achievement: 04/02/23 Potential to Achieve Goals: Good    Frequency       Co-evaluation               AM-PAC PT "6 Clicks" Mobility  Outcome Measure Help needed turning from your back to your side while in a flat bed without using bedrails?: None Help needed moving from lying on your back to sitting on the side of a flat bed without using bedrails?: None Help needed moving to and from a bed to a chair (including a wheelchair)?: None Help needed standing up from a  chair using your arms (e.g., wheelchair or bedside chair)?: None Help needed to walk in hospital room?: None Help needed climbing 3-5 steps with a railing? : A Little 6 Click Score: 23    End of Session Equipment Utilized During Treatment: Gait belt Activity Tolerance: Patient tolerated treatment well Patient left: in bed;with call bell/phone within reach Nurse Communication: Mobility status PT Visit Diagnosis: Unsteadiness on feet (R26.81);Difficulty in walking, not elsewhere classified (R26.2)  Time: 1357-1420 PT Time Calculation (min) (ACUTE ONLY): 23 min   Charges:   PT Evaluation $PT Eval Low Complexity: 1 Low PT Treatments $Gait Training: 8-22 mins PT General Charges $$ ACUTE PT VISIT: 1 Visit         Wynn Maudlin, DPT Acute Rehabilitation Services Office [phone]  03/26/23 3:34 PM

## 2023-03-26 NOTE — Progress Notes (Signed)
OCEANIC STROKE RESEARCH STUDY NOTE   Patient is participating in  [place]  stroke prevention study ( standard of care antiplatelet therapy plus Asundexian-factor 11 inhibitor versus standard of care antiplatelet therapy plus placebo ).  Patient was given study material to review and informed consent form at 11:30 AM.  Patient was advised to take as much time as she wanted to review the consent form and encouraged to ask questions which were answered.  Patient's family was not at the bedside v.  It was made clear to the patient that study participation is voluntary and patient will still get the same excellent standard of care irrespective of whether she chooses to participate in the study or not.  The benefits of participation in the study as well as the risk involved including but not limited to bleeding as well as alternatives to not participating in the study were clearly discussed with the patient and family who expressed understanding.  The patient was clearly informed that patient has no obligation to's stay in the study for the entire duration and is free to discontinue study medication or study participation if he is dissatisfied at any time in the future.  The research study office visit scheduled was explained to the patient and study obligations were clearly explained as well.  Patient voiced understanding and willingness to participate in the study.  The study inclusion exclusion criteria reviewed by me personally as well as study coordinator and verified that patient met all criteria.  Patient signed informed consent  in my presence and  240  PM.  The hospital research pharmacy was notified in advance about potential patient participation and after patient randomization and patient received first dose of medication before discharge and she was discharged home with the study medication bottle and instructed to call [facility] research office with any questions.  No study specific procedure was done prior to  patient signing informed consent form.  A copy of informed consent form and patient Annette Stable of Rights signed by the patient was given to her.  Delia Heady, MD

## 2023-03-26 NOTE — Progress Notes (Signed)
[facility] New Study Start: OCEANIC-STROKE   SUMMARY For more information refer to: [email or web address] Study Identifier: [id number]    A Multicenter, International, Randomized, Placebo Controlled, Double-blind, Parallel Group and Event Driven Phase 3 Study of the Oral FXIa Inhibitor Asundexian ([id number]) for the Prevention of Ischemic Stroke in Male and Male Participants Aged 84 Years and Older After an Acute Non-cardioembolic Ischemic Stroke or High-risk TIA  Brief Summary This study will randomize adult patients with an initial diagnosis of acute non-cardioembolic ischemic stroke or high-risk transient ischemic attack (TIA) to treatment within 72 hours of symptom onset and with the intention to be treated with antiplatelet therapy.  Design Phase 3, Randomized, Interventional, Event-Driven  Intervention Asundexian ([id number]) 50 mg tablets or matching placebo tablets (pink, oval, film-coated tablets  Concomitant Therapy Participants are to receive antiplatelet therapy (single or dual; provider discretion) during the study conduct.  Prohibited Therapy Oral anticoagulation Full dose and/or long-term anticoagulation therapy with heparin or LMWH  Chronic (more than 4 weeks continuous) therapy with NSAIDs during the study conduct Concomitant use of combined P-gp and strong/moderate CYP3A4 inducers Concomitant use of combined P-gp and strong CYP3A4 inhibitors Herbal/traditional medications and/or supplements with known anticoagulant/antiplatelet effects  Anticoagulation Prophylaxis Venous thromboembolism prophylaxis with LMWH or unfractionated heparin for short periods of time (2 weeks) is allowed.  Potential Drug-Drug Interactions Rosuvastatin 20 mg daily or higher (additional monitoring may be warranted due to increased concentrations of rosuvastatin) Concha [place] ([id number]) is a weak inhibitor of CYP2C8)  Administration  Can be taken irrespective of food  intake. Should be swallowed whole with water, preferably in the morning. CANNOT be crushed or broken.      Plan: Start Concha [place] [phone]) 50 mg tablets or placebo] today. Study medication must be picked up from pharmacy, medication can not be tubed.    Please contact [facility] if any questions or concerns regarding the study medication.    Charlett Nose, PharmD, BCPS Investigational Drug Service Pharmacist  [phone]

## 2023-03-26 NOTE — Progress Notes (Addendum)
STROKE TEAM PROGRESS NOTE   BRIEF HPI [name] is a 83 y.o. male with history of TIA, diabetes and hypertension presenting with acute onset right hand and arm numbness.  He states that he first felt numbness in the fingertips of his right hand 2 days ago and then within a few minutes the numbness extended up his arm towards the shoulder.  He came to the emergency department for evaluation.  MRI demonstrates small left thalamic stroke  NIH on Admission 0  INTERIM HISTORY/SUBJECTIVE Patient has remained hemodynamically stable and afebrile.  He is seen in his room with his wife at the bedside and reports improvement in the sensation in his right arm and hand.  OBJECTIVE  CBC    Component Value Date/Time   WBC 6.8 03/26/2023 0445   RBC 3.92 (L) 03/26/2023 0445   HGB 12.6 (L) 03/26/2023 0445   HCT 37.4 (L) 03/26/2023 0445   PLT 268 03/26/2023 0445   MCV 95.4 03/26/2023 0445   MCH 32.1 03/26/2023 0445   MCHC 33.7 03/26/2023 0445   RDW 13.6 03/26/2023 0445   LYMPHSABS 1.3 03/25/2023 1215   MONOABS 0.9 03/25/2023 1215   EOSABS 0.5 03/25/2023 1215   BASOSABS 0.1 03/25/2023 1215    BMET    Component Value Date/Time   NA 136 03/26/2023 0445   K 4.4 03/26/2023 0445   CL 107 03/26/2023 0445   CO2 20 (L) 03/26/2023 0445   GLUCOSE 93 03/26/2023 0445   BUN 25 (H) 03/26/2023 0445   CREATININE 1.13 03/26/2023 0445   CALCIUM 8.4 (L) 03/26/2023 0445   GFRNONAA >60 03/26/2023 0445    IMAGING past 24 hours MR ANGIO HEAD WO CONTRAST Result Date: 03/26/2023 CLINICAL DATA:  Acute neurologic deficit EXAM: MRA NECK WITHOUT AND WITH CONTRAST MRA HEAD WITHOUT CONTRAST TECHNIQUE: Multiplanar and multiecho pulse sequences of the neck were obtained without and with intravenous contrast. Angiographic images of the neck were obtained using MRA technique without and with intravenous contrast; Angiographic images of the Circle of Willis were obtained using MRA technique without intravenous  contrast. CONTRAST:  7mL GADAVIST GADOBUTROL 1 MMOL/ML IV SOLN COMPARISON:  None Available. 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  Normal. Other: None. MRA HEAD FINDINGS POSTERIOR CIRCULATION: Vertebral arteries are normal. No proximal occlusion of the anterior or inferior cerebellar arteries. Basilar artery is normal. Superior cerebellar arteries are normal. Posterior cerebral arteries are normal. ANTERIOR CIRCULATION: Intracranial internal carotid arteries are normal. Anterior cerebral arteries are normal. Middle cerebral arteries are normal. Anatomic Variants: None Other: None. IMPRESSION: 1. 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 2. A 50% stenosis of the proximal left ICA. 3. No intracranial large vessel occlusion or high-grade stenosis. Electronically Signed   By: Deatra Robinson M.D.   On: 03/26/2023 01:37   MR ANGIO NECK W WO CONTRAST Result Date: 03/26/2023 CLINICAL DATA:  Acute neurologic deficit EXAM: MRA NECK WITHOUT AND WITH CONTRAST MRA HEAD WITHOUT CONTRAST TECHNIQUE: Multiplanar and multiecho pulse sequences of the neck were obtained without and with intravenous contrast. Angiographic images of the neck were obtained using MRA technique without and with intravenous contrast; Angiographic images of the Circle of Willis were obtained using MRA technique without intravenous contrast. CONTRAST:  7mL GADAVIST GADOBUTROL 1 MMOL/ML IV SOLN COMPARISON:  None Available. 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  Normal. Other: None. MRA HEAD FINDINGS POSTERIOR CIRCULATION: Vertebral arteries are normal. No proximal occlusion of the anterior or inferior cerebellar arteries. Basilar artery is  normal. Superior cerebellar arteries are normal. Posterior cerebral arteries are normal. ANTERIOR CIRCULATION: Intracranial internal carotid arteries are normal. Anterior cerebral arteries are normal. Middle cerebral arteries are normal. Anatomic Variants: None Other: None. IMPRESSION: 1. 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 2. A 50% stenosis of the proximal left ICA. 3. No intracranial large vessel occlusion or high-grade stenosis. Electronically Signed   By: Deatra Robinson M.D.   On: 03/26/2023 01:37   MR BRAIN WO CONTRAST Result Date: 03/25/2023 CLINICAL DATA:  Stroke suspected, right arm and leg numbness EXAM: MRI HEAD WITHOUT CONTRAST TECHNIQUE: Multiplanar, multiecho pulse sequences of the brain and surrounding structures were obtained without intravenous contrast. COMPARISON:  09/11/2020 MRI head, 03/25/2023 CT head FINDINGS: Brain: Restricted diffusion with ADC correlate in the left lateral thalamus (series 3, images 27-28). No acute hemorrhage, mass, mass effect, or midline shift. No hydrocephalus or extra-axial collection. No hemosiderin deposition to suggest remote hemorrhage. Remote lacunar infarcts in the right thalamus and right basal ganglia. Scattered and confluent T2 hyperintense signal in the periventricular white matter, likely the sequela of moderate chronic small vessel ischemic disease. Vascular: Normal arterial flow voids. Skull and upper cervical spine: Normal marrow signal. Sinuses/Orbits: Mucosal thickening in the maxillary sinuses and ethmoid air cells. Status post bilateral lens replacements. No acute finding in the orbits. Other: Trace fluid in the left mastoid air cells. IMPRESSION: Acute infarct in the left lateral thalamus. These results were called by telephone at the time of interpretation on 03/25/2023 at 5:59 pm to provider GRAY , who verbally acknowledged these results. Electronically Signed   By: Wiliam Ke M.D.   On: 03/25/2023 18:01   CT HEAD WO CONTRAST Result  Date: 03/25/2023 CLINICAL DATA:  Provided history: Neuro deficit, acute, stroke suspected. Right arm and right leg numbness. History of TIA. EXAM: CT HEAD WITHOUT CONTRAST TECHNIQUE: Contiguous axial images were obtained from the base of the skull through the vertex without intravenous contrast. RADIATION DOSE REDUCTION: This exam was performed according to the departmental dose-optimization program which includes automated exposure control, adjustment of the mA and/or kV according to patient size and/or use of iterative reconstruction technique. COMPARISON:  Brain MRI 09/11/2020.  Head CT 09/11/2020. FINDINGS: Brain: Generalized cerebral atrophy. Patchy and ill-defined hypoattenuation within the cerebral white matter, nonspecific but compatible with moderate chronic small vessel ischemic disease. Chronic lacunar infarct again noted within the right thalamus. There is no acute intracranial hemorrhage. No demarcated cortical infarct. No extra-axial fluid collection. No evidence of an intracranial mass. No midline shift. Vascular: No hyperdense vessel.  Atherosclerotic calcifications. Skull: No calvarial fracture or aggressive osseous lesion. Sinuses/Orbits: No mass or acute finding within the imaged orbits. Mild-to-moderate mucosal thickening within the right maxillary sinus at the imaged levels. Small-volume frothy secretions, and moderate background mucosal thickening, within the left maxillary sinus at the imaged levels. Mild mucosal thickening within the bilateral sphenoid sinuses. IMPRESSION: 1. No evidence of an acute intracranial abnormality. 2. Parenchymal atrophy and chronic small vessel ischemic disease. 3. Paranasal sinus disease at the imaged levels as described. Electronically Signed   By: Jackey Loge D.O.   On: 03/25/2023 13:19    Vitals:   03/26/23 0730 03/26/23 0950 03/26/23 1000 03/26/23  1130  BP: (!) 158/78  125/65 137/85  Pulse: 85  79 71  Resp: 19  19 20   Temp:  (!) 97.4 F (36.3 C)     TempSrc:  Oral    SpO2: 99%  94% 97%     PHYSICAL EXAM General:  Alert, well-nourished, well-developed pleasant elderly Caucasian male in no acute distress Psych:  Mood and affect appropriate for situation CV: Regular rate and rhythm on monitor Respiratory:  Regular, unlabored respirations on room air  NEURO:  Mental Status: AA&Ox3, patient is able to give clear and coherent history Speech/Language: speech is without dysarthria or aphasia.    Cranial Nerves:  II: PERRL. Visual fields full.  III, IV, VI: EOMI. Eyelids elevate symmetrically.  VII: Face is symmetrical resting and smiling VIII: hearing intact to voice. IX, X: Phonation is normal.  XII: tongue is midline without fasciculations. Motor: Able to move all 4 extremities with good antigravity strength Tone: is normal and bulk is normal Sensation- Intact to light touch bilaterally.  Coordination: FTN intact bilaterally Gait- deferred  Most Recent NIH 0  ASSESSMENT/PLAN  Acute Ischemic Infarct:  left thalamic infarct Etiology: Small vessel disease CT head No acute abnormality. Small vessel disease. Atrophy.  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  Patient unable to take Brilinta due to allergy Therapy recommendations:  Pending Disposition: Home  Hx of Stroke/TIA Patient has a history of 2 TIAs in 2022  Hypertension Home meds: Valsartan 40 mg daily Stable Blood Pressure Goal: BP less than 220/110   Hyperlipidemia Home meds: Atorvastatin 20 mg daily, increased to 40 LDL 72, goal < 70 Continue statin at discharge  Diabetes type II Controlled Home meds: Metformin 500 mg twice daily HgbA1c 5.6, goal <  7.0 CBGs SSI Recommend close follow-up with PCP for better DM control  Other Stroke Risk Factors None   Other Active Problems None  Hospital day # 0  Patient seen by NP with MD, MD to edit note as needed. Cortney E Ernestina Columbia , MSN, AGACNP-BC [facility] See Amion for schedule and pager information 03/26/2023 12:48 PM   STROKE MD NOTE :  I have personally obtained history,examined this patient, reviewed notes, independently viewed imaging studies, participated in medical decision making and plan of care.ROS completed by me personally and pertinent positives fully documented  I have made any additions or clarifications directly to the above note. Agree with note above.  Patient presented with sudden onset of right hand and arm numbness which has resolved and MRI shows small left thalamic lacunar stroke.  Recommend aspirin and Plavix for 3 weeks followed by aspirin alone as patient is allergic to Brilinta.  Continue ongoing stroke workup and aggressive risk factor modification.  Patient may also consider possible participation in the [facility] stroke prevention study(standard of care antiplatelet therapy with or without unknowable factor XI inhibitor Asundexian ) patient was given written information to review and decide.  He was clearly in informed that study participation voluntary and he will get the same excellent standard of care irrespective of whether he participate in the study or not. Greater than 50% time during this 50-minute visit was spent on counseling and coordination of care  and discussion with patient and care team and answering questions.  Discussed with Dr. Lennox Laity, MD Medical Director [facility] Pager: [phone] 03/26/2023 4:47 PM   To contact Stroke Continuity provider, please refer to [email or web address]. After hours, contact General Neurology

## 2023-03-26 NOTE — Care Management Obs Status (Signed)
MEDICARE OBSERVATION STATUS NOTIFICATION   Patient Details  Name: Timothy Brewer MRN: [id number] Date of Birth: 05/02/1940   Medicare Observation Status Notification Given:  Yes    Oletta Cohn, RN 03/26/2023, 2:25 PM

## 2023-03-26 NOTE — Evaluation (Signed)
Occupational Therapy Evaluation Patient Details Name: Timothy Brewer MRN: [id number] DOB: [birth year] Today's Date: 03/26/2023   History of Present Illness 83 y.o. male with history of diabetes, hypertension, prior stroke who presents with sudden onset right upper and lower extremity numbness.   Clinical Impression   Patient admitted for the diagnosis above.  PTA he lives at home with his spouse, and remained independent with all ADL,iADL, mobility and community mobility.  Patient continues to endorse decreased sensation to R arm and leg, and it does impact the quality of his gait.  No OT needs exist, and no post acute OT is anticipated.  PT eval pending.       If plan is discharge home, recommend the following: Assist for transportation    Functional Status Assessment  Patient has not had a recent decline in their functional status  Equipment Recommendations  None recommended by OT    Recommendations for Other Services       Precautions / Restrictions Precautions Precautions: Fall Restrictions Weight Bearing Restrictions Per Provider Order: No      Mobility Bed Mobility Overal bed mobility: Independent                  Transfers Overall transfer level: Modified independent Equipment used: None               General transfer comment: Decreased sensation does impact walking quality on the R leg, no LOB or knee buckle      Balance Overall balance assessment: Mild deficits observed, not formally tested                                         ADL either performed or assessed with clinical judgement   ADL Overall ADL's : At baseline                                             Vision Patient Visual Report: No change from baseline       Perception Perception: Not tested       Praxis Praxis: Not tested       Pertinent Vitals/Pain Pain Assessment Pain Assessment: No/denies pain     Extremity/Trunk  Assessment Upper Extremity Assessment Upper Extremity Assessment: Right hand dominant;RUE deficits/detail RUE Deficits / Details: full AROM and strength RUE Sensation: decreased light touch RUE Coordination: WNL   Lower Extremity Assessment Lower Extremity Assessment: Defer to PT evaluation   Cervical / Trunk Assessment Cervical / Trunk Assessment: Normal   Communication Communication Communication: No apparent difficulties   Cognition Arousal: Alert Behavior During Therapy: WFL for tasks assessed/performed Overall Cognitive Status: Within Functional Limits for tasks assessed                                       General Comments   VSS on RA    Exercises     Shoulder Instructions      Home Living Family/patient expects to be discharged to:: Private residence Living Arrangements: Spouse/significant other Available Help at Discharge: Family;Available 24 hours/day Type of Home: House Home Access: Level entry     Home Layout: One level     Bathroom Shower/Tub: Walk-in shower  Bathroom Toilet: Handicapped height Bathroom Accessibility: Yes How Accessible: Accessible via walker Home Equipment: Rollator (4 wheels);Cane - single point;Shower seat          Prior Functioning/Environment Prior Level of Function : Independent/Modified Independent;Driving                        OT Problem List: Impaired balance (sitting and/or standing)      OT Treatment/Interventions:      OT Goals(Current goals can be found in the care plan section) Acute Rehab OT Goals Patient Stated Goal: Return home today OT Goal Formulation: With patient Time For Goal Achievement: 04/02/23 Potential to Achieve Goals: Good  OT Frequency:      Co-evaluation              AM-PAC OT "6 Clicks" Daily Activity     Outcome Measure Help from another person eating meals?: None Help from another person taking care of personal grooming?: None Help from another person  toileting, which includes using toliet, bedpan, or urinal?: None Help from another person bathing (including washing, rinsing, drying)?: None Help from another person to put on and taking off regular upper body clothing?: None Help from another person to put on and taking off regular lower body clothing?: None 6 Click Score: 24   End of Session Nurse Communication: Mobility status  Activity Tolerance: Patient tolerated treatment well Patient left: in bed;with call bell/phone within reach  OT Visit Diagnosis: Unsteadiness on feet (R26.81)                Time: 1210-1228 OT Time Calculation (min): 18 min Charges:  OT General Charges $OT Visit: 1 Visit OT Evaluation $OT Eval Moderate Complexity: 1 Mod  03/26/2023  RP, OTR/L  Acute Rehabilitation Services  Office:  [phone]   Suzanna Obey 03/26/2023, 12:50 PM

## 2023-03-27 DIAGNOSIS — E785 Hyperlipidemia, unspecified: Secondary | ICD-10-CM | POA: Diagnosis not present

## 2023-03-27 DIAGNOSIS — E1169 Type 2 diabetes mellitus with other specified complication: Secondary | ICD-10-CM | POA: Diagnosis not present

## 2023-03-27 DIAGNOSIS — E1159 Type 2 diabetes mellitus with other circulatory complications: Secondary | ICD-10-CM | POA: Diagnosis not present

## 2023-03-27 DIAGNOSIS — I639 Cerebral infarction, unspecified: Secondary | ICD-10-CM | POA: Diagnosis not present

## 2023-03-27 DIAGNOSIS — I152 Hypertension secondary to endocrine disorders: Secondary | ICD-10-CM

## 2023-03-27 LAB — GLUCOSE, CAPILLARY: Glucose-Capillary: 144 mg/dL — ABNORMAL HIGH (ref 70–99)

## 2023-03-27 MED ORDER — ACETAMINOPHEN 650 MG RE SUPP: 650.0000 mg | RECTAL | Status: DC | PRN

## 2023-03-27 MED ORDER — ACETAMINOPHEN 325 MG PO TABS: 650.0000 mg | ORAL_TABLET | ORAL | Status: DC | PRN

## 2023-03-27 MED ORDER — ASPIRIN 81 MG PO TBEC: 81.0000 mg | DELAYED_RELEASE_TABLET | Freq: Every day | ORAL | 12 refills | Status: AC

## 2023-03-27 MED ORDER — CLOPIDOGREL BISULFATE 75 MG PO TABS: 75.0000 mg | ORAL_TABLET | Freq: Every day | ORAL | 0 refills | Status: DC

## 2023-03-27 MED ORDER — ACETAMINOPHEN 160 MG/5ML PO SOLN: 650.0000 mg | ORAL | Status: DC | PRN

## 2023-03-27 MED ORDER — STUDY - OCEANIC-STROKE - ASUNDEXIAN 50 MG OR PLACEBO TABLET (PI-SETHI): 1.0000 | ORAL_TABLET | Freq: Every day | ORAL | 0 refills | Status: DC

## 2023-03-27 NOTE — Plan of Care (Signed)
Pt has rested quietly throughout the night with no distress noted. Alert and oriented. Continues with numbness in right arm and right foot. ON room air. SR on the monitor. Voids per urinal. And up ad lib to BR. No complaints voiced.     Problem: Education: Goal: Knowledge of secondary prevention will improve (MUST DOCUMENT ALL) Outcome: Progressing Goal: Knowledge of patient specific risk factors will improve (DELETE if not current risk factor) Outcome: Progressing   Problem: Ischemic Stroke/TIA Tissue Perfusion: Goal: Complications of ischemic stroke/TIA will be minimized Outcome: Progressing   Problem: Coping: Goal: Will identify appropriate support needs Outcome: Progressing   Problem: Health Behavior/Discharge Planning: Goal: Goals will be collaboratively established with patient/family Outcome: Progressing   Problem: Education: Goal: Knowledge of General Education information will improve Description: Including pain rating scale, medication(s)/side effects and non-pharmacologic comfort measures Outcome: Progressing   Problem: Clinical Measurements: Goal: Ability to maintain clinical measurements within normal limits will improve Outcome: Progressing   Problem: Pain Managment: Goal: General experience of comfort will improve and/or be controlled Outcome: Progressing

## 2023-03-27 NOTE — Discharge Summary (Signed)
PATIENT DETAILS Name: Timothy Brewer Age: 83 y.o. Sex: male Date of Birth: [birth year] MRN: [id number]. Admitting Physician: Charlsie Quest, MD EXB:[id number], Onalee Hua, MD  Admit Date: 03/25/2023 Discharge date: 03/27/2023  Recommendations for Outpatient Follow-up:  Follow up with PCP in 1-2 weeks Please obtain CMP/CBC in one week  Admitted From:  Home  Disposition: Home   Discharge Condition: good  CODE STATUS:   Code Status: Do not attempt resuscitation (DNR) PRE-ARREST INTERVENTIONS DESIRED   Diet recommendation:  Diet Order             Diet - low sodium heart healthy           Diet Heart Room service appropriate? Yes; Fluid consistency: Thin  Diet effective now                    Brief Summary: Timothy Brewer is a 83 y.o. 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 Bascom Levels will refer patient to outpatient vascular surgery LDL 72-A1c 5.6 Recommendations from stroke MD is to continue aspirin/Plavix x 3 weeks followed by aspirin alone (patient allergic to Brilinta)-patient has also been enrolled in [place] stroke prevention study  (standard of care antiplatelet therapy plus Asundexian-factor 11 inhibitor versus standard of care antiplatelet therapy plus placebo )  Rest of his medical problems were stable during this hospitalization  Discharge Diagnoses:  Principal Problem:   Acute CVA (cerebrovascular accident) (HCC) Active Problems:   Hypertension associated with diabetes (HCC)   Hyperlipidemia associated with type 2 diabetes mellitus (HCC)   Type 2 diabetes mellitus (HCC)   Discharge  Instructions:  Activity:  As tolerated    Discharge Instructions     Call MD for:  extreme fatigue   Complete by: As directed    Call MD for:  persistant dizziness or light-headedness   Complete by: As directed    Diet - low sodium heart healthy   Complete by: As directed    Discharge instructions   Complete by: As directed    Follow with Primary MD  Timothy Joe, MD in 1-2 weeks  Please get a complete blood count and chemistry panel checked by your Primary MD at your next visit, and again as instructed by your Primary MD.  Get Medicines reviewed and adjusted: Please take all your medications with you for your next visit with your Primary MD  Laboratory/radiological data: Please request your Primary MD to go over all hospital tests and procedure/radiological results at the follow up, please ask your Primary MD to get all Hospital records sent to his/her office.  In some cases, they will be blood work, cultures and biopsy results pending at the time of your discharge. Please request that your primary care M.D. follows up on these results.  Also Note the following: If you experience worsening of your admission symptoms, develop shortness of breath, life threatening emergency, suicidal or homicidal thoughts you must seek medical attention immediately by calling 911 or calling your MD immediately  if symptoms less severe.  You must read complete instructions/literature along with all the possible adverse reactions/side effects for all the Medicines you take and that have been prescribed to you.  Take any new Medicines after you have completely understood and accpet all the possible adverse reactions/side effects.   Do not drive when taking Pain medications or sleeping medications (Benzodaizepines)  Do not take more than prescribed Pain, Sleep and Anxiety Medications. It is not advisable to combine anxiety,sleep and pain medications without talking with your primary care  practitioner  Special Instructions: If you have smoked or chewed Tobacco  in the last 2 yrs please stop smoking, stop any regular Alcohol  and or any Recreational drug use.  Wear Seat belts while driving.  Please note: You were cared for by a hospitalist during your hospital stay. Once you are discharged, your primary care physician will handle any further medical issues. Please note that NO REFILLS for any discharge medications will be authorized once you are discharged, as it is imperative that you return to your primary care physician (or establish a relationship with a primary care physician if you do not have one) for your post hospital discharge needs so that they can reassess your need for medications and monitor your lab values.   Increase activity slowly   Complete by: As directed       Allergies as of 03/27/2023       Reactions   Brilinta [ticagrelor] Shortness Of Breath   Pantoprazole Hives   Rosuvastatin Other (See Comments)   Other Reaction(s): memory fog   Iodine Hives, Other (See Comments)   Internal only        Medication List     TAKE these medications    acetic acid 2 % otic solution Place 4 drops into both ears as needed (itching).   aspirin EC 81 MG tablet Take 1 tablet (81 mg total) by mouth daily. Swallow whole.   atorvastatin 20 MG tablet Commonly known as: LIPITOR Take 1 tablet (20 mg total) by mouth daily at 6 PM.   CALCIUM 600 + D PO Take 1 tablet by mouth every evening.   cetirizine 10 MG tablet Commonly known as: ZYRTEC Take 10 mg by mouth at bedtime.   cholecalciferol 25 MCG (1000 UNIT) tablet Commonly known as: VITAMIN D3 Take 1,000 Units by mouth at bedtime.   ciclopirox 8 % solution Commonly known as: PENLAC Apply topically at bedtime. Apply over nail and surrounding skin. Apply daily over previous coat. Remove weekly with polish remover.   clopidogrel 75 MG tablet Commonly known as: PLAVIX Take 1 tablet (75 mg total) by mouth  daily for 21 days.   cyanocobalamin 500 MCG tablet Commonly known as: VITAMIN B12 Take 500 mcg by mouth every evening.   Iron 325 (65 Fe) MG Tabs Take 325 mg by mouth at bedtime.   Jublia 10 % Soln Generic drug: Efinaconazole Apply 1 application  topically at bedtime. Apply each night at bedtime over coat remove after 7 days and restart   loratadine 10 MG tablet Commonly known as: CLARITIN Take 10 mg by mouth in the morning.   magnesium oxide 400 MG tablet Commonly known as: MAG-OX Take 400 mg by mouth at bedtime.   metFORMIN 500 MG 24 hr tablet Commonly known as: GLUCOPHAGE-XR Take 1 tablet (500 mg total) by mouth 2 (two) times daily.   neomycin-polymyxin b-dexamethasone 3.5-10000-0.1 Susp Commonly known as: MAXITROL Place 3 drops into both eyes as needed (itching).   OCEANIC-STROKE asundexian or placebo 50 mg tablet Take 1 tablet (50 mg total) by mouth daily. For Investigational Use Only. Take 1 tablet by mouth once daily at the same  time each day, preferably in the morning. Please bring bottle to every visit.   OVER THE COUNTER MEDICATION Take 2 capsules by mouth daily. Emma stool softener   PRESERVISION AREDS PO Take 1 tablet by mouth 2 (two) times daily.   Prevagen 10 MG Caps Generic drug: Apoaequorin Take 1 capsule by mouth daily.   sildenafil 20 MG tablet Commonly known as: REVATIO Take 100 mg by mouth daily as needed (erectile dysfunction).   valsartan 40 MG tablet Commonly known as: DIOVAN Take 40 mg by mouth daily.        Follow-up Information     Timothy Joe, MD. Schedule an appointment as soon as possible for a visit in 1 week(s).   Specialty: Family Medicine Contact information: [phone] [address] [place] [address] [phone]         Micki Riley, MD Follow up.   Specialties: Neurology, Radiology Why: Office will call with date/time, If you dont hear from them,please give them a call Contact information: [address] [place] [address] [phone]                Allergies  Allergen Reactions   Brilinta [Ticagrelor] Shortness Of Breath   Pantoprazole Hives   Rosuvastatin Other (See Comments)    Other Reaction(s): memory fog   Iodine Hives and Other (See Comments)    Internal only     Other Procedures/Studies: ECHOCARDIOGRAM COMPLETE Result Date: 03/26/2023    ECHOCARDIOGRAM REPORT   Patient Name:   KANG BRILLA Date of Exam: 03/26/2023 Medical Rec #:  [id number]       Height:       66.0 in Accession #:    [id number]      Weight:       165.8 lb Date of Birth:  Feb 09, 1941      BSA:          1.847 m Patient Age:    82 years        BP:           158/78 mmHg Patient Gender: M               HR:           67 bpm. Exam Location:  Inpatient Procedure: 2D Echo, Cardiac Doppler and Color Doppler Indications:    Stroke  History:        Patient has prior history of Echocardiogram examinations, most                 recent 06/07/2017. CHF, Stroke and TIA; Risk Factors:Hypertension                 and Dyslipidemia. History of negative bubble study.  Sonographer:    Sheralyn Boatman RDCS Referring Phys: [id number] VISHAL R PATEL  Sonographer Comments: Saline bubble study not repeated, patient had negative bubble study with previous echo. IMPRESSIONS  1. Left ventricular ejection fraction, by estimation, is 50 to 55%. The left ventricle has low normal function. The left ventricle demonstrates global hypokinesis. There is mild left ventricular hypertrophy. Left ventricular diastolic parameters are consistent with Grade I diastolic dysfunction (impaired relaxation).  2. Right ventricular systolic function is normal. The right ventricular size is normal.  3. The mitral valve is degenerative. Trivial mitral valve regurgitation. No evidence of mitral stenosis.  4. The aortic valve is tricuspid. Aortic valve regurgitation is not visualized. No aortic stenosis is present. FINDINGS  Left Ventricle: Left ventricular  ejection fraction, by estimation, is 50 to 55%. The left ventricle has low normal function. The left ventricle demonstrates global hypokinesis. The left ventricular internal cavity size was normal in size. There is mild left ventricular hypertrophy. Left ventricular diastolic parameters are consistent with Grade I diastolic dysfunction (impaired relaxation). Right Ventricle: The right ventricular size is normal. No increase in right ventricular wall thickness. Right ventricular systolic function is normal. Left Atrium: Left atrial size was normal in size. Right Atrium: Right atrial size was normal in size. Pericardium: There is no evidence of pericardial effusion. Mitral Valve: The mitral valve is degenerative in appearance. Trivial mitral valve regurgitation. No evidence of mitral valve stenosis. Tricuspid Valve: The tricuspid valve is normal in structure. Tricuspid valve regurgitation is not demonstrated. No evidence of tricuspid stenosis. Aortic Valve: The aortic valve is tricuspid. Aortic valve regurgitation is not visualized. No aortic stenosis is present. Pulmonic Valve: The pulmonic valve was normal in structure. Pulmonic valve regurgitation is not visualized. No evidence of pulmonic stenosis. Aorta: The aortic root and ascending aorta are structurally normal, with no evidence of dilitation. IAS/Shunts: No atrial level shunt detected by color flow Doppler.  LEFT VENTRICLE PLAX 2D LVIDd:         4.90 cm     Diastology LVIDs:         3.70 cm     LV e' medial:    5.98 cm/s LV PW:         1.30 cm     LV E/e' medial:  7.7 LV IVS:        1.10 cm     LV e' lateral:   8.92 cm/s LVOT diam:     2.30 cm     LV E/e' lateral: 5.1 LV SV:         62 LV SV Index:   34 LVOT Area:     4.15 cm  LV Volumes (MOD) LV vol d, MOD A2C: 91.3 ml LV vol d, MOD A4C: 86.2 ml LV vol s, MOD A2C: 50.3 ml LV vol s, MOD A4C: 39.1 ml LV SV MOD A2C:     41.0 ml LV SV MOD A4C:     86.2 ml LV SV MOD BP:      45.6 ml RIGHT VENTRICLE              IVC RV S prime:     10.90 cm/s  IVC diam: 1.80 cm TAPSE (M-mode): 1.8 cm LEFT ATRIUM             Index        RIGHT ATRIUM           Index LA diam:        3.80 cm 2.06 cm/m   RA Area:     12.40 cm LA Vol (A2C):   27.6 ml 14.95 ml/m  RA Volume:   28.30 ml  15.33 ml/m LA Vol (A4C):   19.7 ml 10.67 ml/m LA Biplane Vol: 23.4 ml 12.67 ml/m  AORTIC VALVE LVOT Vmax:   74.40 cm/s LVOT Vmean:  47.700 cm/s LVOT VTI:    0.149 m  AORTA Ao Root diam: 3.60 cm Ao Asc diam:  3.40 cm MITRAL VALVE MV Area (PHT): 2.62 cm    SHUNTS MV Decel Time: 289 msec    Systemic VTI:  0.15 m MV E velocity: 45.85 cm/s  Systemic Diam: 2.30 cm MV A velocity: 98.95 cm/s MV E/A ratio:  0.46 Clearnce Hasten Electronically signed by  Clearnce Hasten Signature Date/Time: 03/26/2023/2:59:23 PM    Final    MR ANGIO HEAD WO CONTRAST Result Date: 03/26/2023 CLINICAL DATA:  Acute neurologic deficit EXAM: MRA NECK WITHOUT AND WITH CONTRAST MRA HEAD WITHOUT CONTRAST TECHNIQUE: Multiplanar and multiecho pulse sequences of the neck were obtained without and with intravenous contrast. Angiographic images of the neck were obtained using MRA technique without and with intravenous contrast; Angiographic images of the Circle of Willis were obtained using MRA technique without intravenous contrast. CONTRAST:  7mL GADAVIST GADOBUTROL 1 MMOL/ML IV SOLN COMPARISON:  None Available. 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  Normal. Other: None. MRA HEAD FINDINGS POSTERIOR CIRCULATION: Vertebral arteries are normal. No proximal occlusion of the anterior or inferior cerebellar arteries. Basilar artery is normal. Superior cerebellar arteries are normal. Posterior cerebral arteries are normal. ANTERIOR CIRCULATION: Intracranial internal carotid arteries are normal. Anterior cerebral arteries are normal. Middle  cerebral arteries are normal. Anatomic Variants: None Other: None. IMPRESSION: 1. 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 2. A 50% stenosis of the proximal left ICA. 3. No intracranial large vessel occlusion or high-grade stenosis. Electronically Signed   By: Deatra Robinson M.D.   On: 03/26/2023 01:37   MR ANGIO NECK W WO CONTRAST Result Date: 03/26/2023 CLINICAL DATA:  Acute neurologic deficit EXAM: MRA NECK WITHOUT AND WITH CONTRAST MRA HEAD WITHOUT CONTRAST TECHNIQUE: Multiplanar and multiecho pulse sequences of the neck were obtained without and with intravenous contrast. Angiographic images of the neck were obtained using MRA technique without and with intravenous contrast; Angiographic images of the Circle of Willis were obtained using MRA technique without intravenous contrast. CONTRAST:  7mL GADAVIST GADOBUTROL 1 MMOL/ML IV SOLN COMPARISON:  None Available. 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  Normal. Other: None. MRA HEAD FINDINGS POSTERIOR CIRCULATION: Vertebral arteries are normal. No proximal occlusion of the anterior or inferior cerebellar arteries. Basilar artery is normal. Superior cerebellar arteries are normal. Posterior cerebral arteries are normal. ANTERIOR CIRCULATION: Intracranial internal carotid arteries are normal. Anterior cerebral arteries are normal. Middle cerebral arteries are normal. Anatomic Variants: None Other: None. IMPRESSION: 1. 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 2. A 50% stenosis of the proximal left ICA. 3. No intracranial large vessel occlusion or high-grade stenosis. Electronically Signed   By: Deatra Robinson M.D.   On: 03/26/2023 01:37   MR BRAIN WO CONTRAST Result Date: 03/25/2023 CLINICAL DATA:  Stroke suspected, right arm and leg numbness EXAM: MRI HEAD WITHOUT CONTRAST TECHNIQUE: Multiplanar,  multiecho pulse sequences of the brain and surrounding structures were obtained without intravenous contrast. COMPARISON:  09/11/2020 MRI head, 03/25/2023 CT head FINDINGS: Brain: Restricted diffusion with ADC correlate in the left lateral thalamus (series 3, images 27-28). No acute hemorrhage, mass, mass effect, or midline shift. No hydrocephalus or extra-axial collection. No hemosiderin deposition to suggest remote hemorrhage. Remote lacunar infarcts in the right thalamus and right basal ganglia. Scattered and confluent T2 hyperintense signal in the periventricular white matter, likely the sequela of moderate chronic small vessel ischemic disease. Vascular: Normal arterial flow voids. Skull and upper cervical spine: Normal marrow signal. Sinuses/Orbits: Mucosal thickening in the maxillary sinuses and ethmoid air cells. Status post bilateral lens replacements. No acute finding in  the orbits. Other: Trace fluid in the left mastoid air cells. IMPRESSION: Acute infarct in the left lateral thalamus. These results were called by telephone at the time of interpretation on 03/25/2023 at 5:59 pm to provider GRAY , who verbally acknowledged these results. Electronically Signed   By: Wiliam Ke M.D.   On: 03/25/2023 18:01   CT HEAD WO CONTRAST Result Date: 03/25/2023 CLINICAL DATA:  Provided history: Neuro deficit, acute, stroke suspected. Right arm and right leg numbness. History of TIA. EXAM: CT HEAD WITHOUT CONTRAST TECHNIQUE: Contiguous axial images were obtained from the base of the skull through the vertex without intravenous contrast. RADIATION DOSE REDUCTION: This exam was performed according to the departmental dose-optimization program which includes automated exposure control, adjustment of the mA and/or kV according to patient size and/or use of iterative reconstruction technique. COMPARISON:  Brain MRI 09/11/2020.  Head CT 09/11/2020. FINDINGS: Brain: Generalized cerebral atrophy. Patchy and ill-defined  hypoattenuation within the cerebral white matter, nonspecific but compatible with moderate chronic small vessel ischemic disease. Chronic lacunar infarct again noted within the right thalamus. There is no acute intracranial hemorrhage. No demarcated cortical infarct. No extra-axial fluid collection. No evidence of an intracranial mass. No midline shift. Vascular: No hyperdense vessel.  Atherosclerotic calcifications. Skull: No calvarial fracture or aggressive osseous lesion. Sinuses/Orbits: No mass or acute finding within the imaged orbits. Mild-to-moderate mucosal thickening within the right maxillary sinus at the imaged levels. Small-volume frothy secretions, and moderate background mucosal thickening, within the left maxillary sinus at the imaged levels. Mild mucosal thickening within the bilateral sphenoid sinuses. IMPRESSION: 1. No evidence of an acute intracranial abnormality. 2. Parenchymal atrophy and chronic small vessel ischemic disease. 3. Paranasal sinus disease at the imaged levels as described. Electronically Signed   By: Jackey Loge D.O.   On: 03/25/2023 13:19     TODAY-DAY OF DISCHARGE:  Subjective:   Zalman Ganser today has no headache,no chest abdominal pain,no new weakness tingling or numbness, feels much better wants to go home today.   Objective:   Blood pressure (!) 153/87, pulse 96, temperature 98 F (36.7 C), temperature source Oral, resp. rate 18, SpO2 96%.  Intake/Output Summary (Last 24 hours) at 03/27/2023 0914 Last data filed at 03/27/2023 0551 Gross per 24 hour  Intake 1200 ml  Output 775 ml  Net 425 ml   There were no vitals filed for this visit.  Exam: Awake Alert, Oriented *3, No new F.N deficits, Normal affect [place].AT,PERRAL Supple Neck,No JVD, No cervical lymphadenopathy appriciated.  Symmetrical Chest wall movement, Good air movement bilaterally, CTAB RRR,No Gallops,Rubs or new Murmurs, No Parasternal Heave +ve B.Sounds, Abd Soft, Non tender, No  organomegaly appriciated, No rebound -guarding or rigidity. No Cyanosis, Clubbing or edema, No new Rash or bruise   PERTINENT RADIOLOGIC STUDIES: ECHOCARDIOGRAM COMPLETE Result Date: 03/26/2023    ECHOCARDIOGRAM REPORT   Patient Name:   Timothy Brewer Date of Exam: 03/26/2023 Medical Rec #:  [id number]       Height:       66.0 in Accession #:    [id number]      Weight:       165.8 lb Date of Birth:  Nov 13, 1940      BSA:          1.847 m Patient Age:    82 years        BP:           158/78 mmHg Patient Gender: M  HR:           67 bpm. Exam Location:  Inpatient Procedure: 2D Echo, Cardiac Doppler and Color Doppler Indications:    Stroke  History:        Patient has prior history of Echocardiogram examinations, most                 recent 06/07/2017. CHF, Stroke and TIA; Risk Factors:Hypertension                 and Dyslipidemia. History of negative bubble study.  Sonographer:    Sheralyn Boatman RDCS Referring Phys: [id number] VISHAL R PATEL  Sonographer Comments: Saline bubble study not repeated, patient had negative bubble study with previous echo. IMPRESSIONS  1. Left ventricular ejection fraction, by estimation, is 50 to 55%. The left ventricle has low normal function. The left ventricle demonstrates global hypokinesis. There is mild left ventricular hypertrophy. Left ventricular diastolic parameters are consistent with Grade I diastolic dysfunction (impaired relaxation).  2. Right ventricular systolic function is normal. The right ventricular size is normal.  3. The mitral valve is degenerative. Trivial mitral valve regurgitation. No evidence of mitral stenosis.  4. The aortic valve is tricuspid. Aortic valve regurgitation is not visualized. No aortic stenosis is present. FINDINGS  Left Ventricle: Left ventricular ejection fraction, by estimation, is 50 to 55%. The left ventricle has low normal function. The left ventricle demonstrates global hypokinesis. The left ventricular internal cavity size was  normal in size. There is mild left ventricular hypertrophy. Left ventricular diastolic parameters are consistent with Grade I diastolic dysfunction (impaired relaxation). Right Ventricle: The right ventricular size is normal. No increase in right ventricular wall thickness. Right ventricular systolic function is normal. Left Atrium: Left atrial size was normal in size. Right Atrium: Right atrial size was normal in size. Pericardium: There is no evidence of pericardial effusion. Mitral Valve: The mitral valve is degenerative in appearance. Trivial mitral valve regurgitation. No evidence of mitral valve stenosis. Tricuspid Valve: The tricuspid valve is normal in structure. Tricuspid valve regurgitation is not demonstrated. No evidence of tricuspid stenosis. Aortic Valve: The aortic valve is tricuspid. Aortic valve regurgitation is not visualized. No aortic stenosis is present. Pulmonic Valve: The pulmonic valve was normal in structure. Pulmonic valve regurgitation is not visualized. No evidence of pulmonic stenosis. Aorta: The aortic root and ascending aorta are structurally normal, with no evidence of dilitation. IAS/Shunts: No atrial level shunt detected by color flow Doppler.  LEFT VENTRICLE PLAX 2D LVIDd:         4.90 cm     Diastology LVIDs:         3.70 cm     LV e' medial:    5.98 cm/s LV PW:         1.30 cm     LV E/e' medial:  7.7 LV IVS:        1.10 cm     LV e' lateral:   8.92 cm/s LVOT diam:     2.30 cm     LV E/e' lateral: 5.1 LV SV:         62 LV SV Index:   34 LVOT Area:     4.15 cm  LV Volumes (MOD) LV vol d, MOD A2C: 91.3 ml LV vol d, MOD A4C: 86.2 ml LV vol s, MOD A2C: 50.3 ml LV vol s, MOD A4C: 39.1 ml LV SV MOD A2C:     41.0 ml LV SV MOD A4C:  86.2 ml LV SV MOD BP:      45.6 ml RIGHT VENTRICLE             IVC RV S prime:     10.90 cm/s  IVC diam: 1.80 cm TAPSE (M-mode): 1.8 cm LEFT ATRIUM             Index        RIGHT ATRIUM           Index LA diam:        3.80 cm 2.06 cm/m   RA Area:      12.40 cm LA Vol (A2C):   27.6 ml 14.95 ml/m  RA Volume:   28.30 ml  15.33 ml/m LA Vol (A4C):   19.7 ml 10.67 ml/m LA Biplane Vol: 23.4 ml 12.67 ml/m  AORTIC VALVE LVOT Vmax:   74.40 cm/s LVOT Vmean:  47.700 cm/s LVOT VTI:    0.149 m  AORTA Ao Root diam: 3.60 cm Ao Asc diam:  3.40 cm MITRAL VALVE MV Area (PHT): 2.62 cm    SHUNTS MV Decel Time: 289 msec    Systemic VTI:  0.15 m MV E velocity: 45.85 cm/s  Systemic Diam: 2.30 cm MV A velocity: 98.95 cm/s MV E/A ratio:  0.46 Clearnce Hasten Electronically signed by Clearnce Hasten Signature Date/Time: 03/26/2023/2:59:23 PM    Final    MR ANGIO HEAD WO CONTRAST Result Date: 03/26/2023 CLINICAL DATA:  Acute neurologic deficit EXAM: MRA NECK WITHOUT AND WITH CONTRAST MRA HEAD WITHOUT CONTRAST TECHNIQUE: Multiplanar and multiecho pulse sequences of the neck were obtained without and with intravenous contrast. Angiographic images of the neck were obtained using MRA technique without and with intravenous contrast; Angiographic images of the Circle of Willis were obtained using MRA technique without intravenous contrast. CONTRAST:  7mL GADAVIST GADOBUTROL 1 MMOL/ML IV SOLN COMPARISON:  None Available. 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  Normal. Other: None. MRA HEAD FINDINGS POSTERIOR CIRCULATION: Vertebral arteries are normal. No proximal occlusion of the anterior or inferior cerebellar arteries. Basilar artery is normal. Superior cerebellar arteries are normal. Posterior cerebral arteries are normal. ANTERIOR CIRCULATION: Intracranial internal carotid arteries are normal. Anterior cerebral arteries are normal. Middle cerebral arteries are normal. Anatomic Variants: None Other: None. IMPRESSION: 1. 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 2. A 50% stenosis of the proximal left ICA. 3. No intracranial large  vessel occlusion or high-grade stenosis. Electronically Signed   By: Deatra Robinson M.D.   On: 03/26/2023 01:37   MR ANGIO NECK W WO CONTRAST Result Date: 03/26/2023 CLINICAL DATA:  Acute neurologic deficit EXAM: MRA NECK WITHOUT AND WITH CONTRAST MRA HEAD WITHOUT CONTRAST TECHNIQUE: Multiplanar and multiecho pulse sequences of the neck were obtained without and with intravenous contrast. Angiographic images of the neck were obtained using MRA technique without and with intravenous contrast; Angiographic images of the Circle of Willis were obtained using MRA technique without intravenous contrast. CONTRAST:  7mL GADAVIST GADOBUTROL 1 MMOL/ML IV SOLN COMPARISON:  None Available. 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  Normal. Other: None. MRA HEAD FINDINGS POSTERIOR CIRCULATION: Vertebral arteries are normal. No proximal occlusion of the anterior or inferior cerebellar arteries. Basilar artery is normal. Superior cerebellar arteries  are normal. Posterior cerebral arteries are normal. ANTERIOR CIRCULATION: Intracranial internal carotid arteries are normal. Anterior cerebral arteries are normal. Middle cerebral arteries are normal. Anatomic Variants: None Other: None. IMPRESSION: 1. 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 2. A 50% stenosis of the proximal left ICA. 3. No intracranial large vessel occlusion or high-grade stenosis. Electronically Signed   By: Deatra Robinson M.D.   On: 03/26/2023 01:37   MR BRAIN WO CONTRAST Result Date: 03/25/2023 CLINICAL DATA:  Stroke suspected, right arm and leg numbness EXAM: MRI HEAD WITHOUT CONTRAST TECHNIQUE: Multiplanar, multiecho pulse sequences of the brain and surrounding structures were obtained without intravenous contrast. COMPARISON:  09/11/2020 MRI head, 03/25/2023 CT head FINDINGS: Brain: Restricted  diffusion with ADC correlate in the left lateral thalamus (series 3, images 27-28). No acute hemorrhage, mass, mass effect, or midline shift. No hydrocephalus or extra-axial collection. No hemosiderin deposition to suggest remote hemorrhage. Remote lacunar infarcts in the right thalamus and right basal ganglia. Scattered and confluent T2 hyperintense signal in the periventricular white matter, likely the sequela of moderate chronic small vessel ischemic disease. Vascular: Normal arterial flow voids. Skull and upper cervical spine: Normal marrow signal. Sinuses/Orbits: Mucosal thickening in the maxillary sinuses and ethmoid air cells. Status post bilateral lens replacements. No acute finding in the orbits. Other: Trace fluid in the left mastoid air cells. IMPRESSION: Acute infarct in the left lateral thalamus. These results were called by telephone at the time of interpretation on 03/25/2023 at 5:59 pm to provider GRAY , who verbally acknowledged these results. Electronically Signed   By: Wiliam Ke M.D.   On: 03/25/2023 18:01   CT HEAD WO CONTRAST Result Date: 03/25/2023 CLINICAL DATA:  Provided history: Neuro deficit, acute, stroke suspected. Right arm and right leg numbness. History of TIA. EXAM: CT HEAD WITHOUT CONTRAST TECHNIQUE: Contiguous axial images were obtained from the base of the skull through the vertex without intravenous contrast. RADIATION DOSE REDUCTION: This exam was performed according to the departmental dose-optimization program which includes automated exposure control, adjustment of the mA and/or kV according to patient size and/or use of iterative reconstruction technique. COMPARISON:  Brain MRI 09/11/2020.  Head CT 09/11/2020. FINDINGS: Brain: Generalized cerebral atrophy. Patchy and ill-defined hypoattenuation within the cerebral white matter, nonspecific but compatible with moderate chronic small vessel ischemic disease. Chronic lacunar infarct again noted within the right thalamus.  There is no acute intracranial hemorrhage. No demarcated cortical infarct. No extra-axial fluid collection. No evidence of an intracranial mass. No midline shift. Vascular: No hyperdense vessel.  Atherosclerotic calcifications. Skull: No calvarial fracture or aggressive osseous lesion. Sinuses/Orbits: No mass or acute finding within the imaged orbits. Mild-to-moderate mucosal thickening within the right maxillary sinus at the imaged levels. Small-volume frothy secretions, and moderate background mucosal thickening, within the left maxillary sinus at the imaged levels. Mild mucosal thickening within the bilateral sphenoid sinuses. IMPRESSION: 1. No evidence of an acute intracranial abnormality. 2. Parenchymal atrophy and chronic small vessel ischemic disease. 3. Paranasal sinus disease at the imaged levels as described. Electronically Signed   By: Jackey Loge D.O.   On: 03/25/2023 13:19     PERTINENT LAB RESULTS: CBC: Recent Labs    03/25/23 1215 03/25/23 1221 03/26/23 0445  WBC 6.6  --  6.8  HGB 14.1 15.0 12.6*  HCT 42.2 44.0 37.4*  PLT 310  --  268   CMET CMP     Component Value Date/Time   NA 136 03/26/2023 0445   K 4.4  03/26/2023 0445   CL 107 03/26/2023 0445   CO2 20 (L) 03/26/2023 0445   GLUCOSE 93 03/26/2023 0445   BUN 25 (H) 03/26/2023 0445   CREATININE 1.13 03/26/2023 0445   CALCIUM 8.4 (L) 03/26/2023 0445   PROT 6.8 03/25/2023 1215   ALBUMIN 3.8 03/25/2023 1215   AST 23 03/25/2023 1215   ALT 19 03/25/2023 1215   ALKPHOS 58 03/25/2023 1215   BILITOT 0.8 03/25/2023 1215   GFRNONAA >60 03/26/2023 0445    GFR CrCl cannot be calculated (Unknown ideal weight.). No results for input(s): "LIPASE", "AMYLASE" in the last 72 hours. No results for input(s): "CKTOTAL", "CKMB", "CKMBINDEX", "TROPONINI" in the last 72 hours. Invalid input(s): "POCBNP" No results for input(s): "DDIMER" in the last 72 hours. Recent Labs    03/26/23 0445  HGBA1C 5.6   Recent Labs     03/26/23 0445  CHOL 156  HDL 70  LDLCALC 72  TRIG 68  CHOLHDL 2.2   No results for input(s): "TSH", "T4TOTAL", "T3FREE", "THYROIDAB" in the last 72 hours.  Invalid input(s): "FREET3" No results for input(s): "VITAMINB12", "FOLATE", "FERRITIN", "TIBC", "IRON", "RETICCTPCT" in the last 72 hours. Coags: Recent Labs    03/25/23 1215  INR 0.9   Microbiology: No results found for this or any previous visit (from the past 240 hours).  FURTHER DISCHARGE INSTRUCTIONS:  Get Medicines reviewed and adjusted: Please take all your medications with you for your next visit with your Primary MD  Laboratory/radiological data: Please request your Primary MD to go over all hospital tests and procedure/radiological results at the follow up, please ask your Primary MD to get all Hospital records sent to his/her office.  In some cases, they will be blood work, cultures and biopsy results pending at the time of your discharge. Please request that your primary care M.D. goes through all the records of your hospital data and follows up on these results.  Also Note the following: If you experience worsening of your admission symptoms, develop shortness of breath, life threatening emergency, suicidal or homicidal thoughts you must seek medical attention immediately by calling 911 or calling your MD immediately  if symptoms less severe.  You must read complete instructions/literature along with all the possible adverse reactions/side effects for all the Medicines you take and that have been prescribed to you. Take any new Medicines after you have completely understood and accpet all the possible adverse reactions/side effects.   Do not drive when taking Pain medications or sleeping medications (Benzodaizepines)  Do not take more than prescribed Pain, Sleep and Anxiety Medications. It is not advisable to combine anxiety,sleep and pain medications without talking with your primary care practitioner  Special  Instructions: If you have smoked or chewed Tobacco  in the last 2 yrs please stop smoking, stop any regular Alcohol  and or any Recreational drug use.  Wear Seat belts while driving.  Please note: You were cared for by a hospitalist during your hospital stay. Once you are discharged, your primary care physician will handle any further medical issues. Please note that NO REFILLS for any discharge medications will be authorized once you are discharged, as it is imperative that you return to your primary care physician (or establish a relationship with a primary care physician if you do not have one) for your post hospital discharge needs so that they can reassess your need for medications and monitor your lab values.  Total Time spent coordinating discharge including counseling, education and face to face  time equals greater than 30 minutes.  SignedJeoffrey Massed 03/27/2023 9:14 AM

## 2023-03-27 NOTE — TOC Transition Note (Signed)
Transition of Care [facility]) - Discharge Note   Patient Details  Name: Timothy Brewer MRN: [id number] Date of Birth: 1940-05-23  [facility]) CM/SW Contact:  Gordy Clement, RN Phone Number: 03/27/2023, 9:20 AM   Clinical Narrative:     Patient will DC to home today. OPPT and OPOT have been recommended and referral sent to [facility] [address] location. No DME recommended. Family to transport           Patient Goals and CMS Choice            Discharge Placement                       Discharge Plan and Services Additional resources added to the After Visit Summary for                                       Social Drivers of Health (SDOH) Interventions SDOH Screenings   Food Insecurity: No Food Insecurity (03/26/2023)  Housing: Low Risk  (03/26/2023)  Transportation Needs: No Transportation Needs (03/26/2023)  Utilities: Not At Risk (03/26/2023)  Social Connections: Socially Integrated (03/26/2023)  Tobacco Use: Medium Risk (03/25/2023)     Readmission Risk Interventions     No data to display

## 2023-04-13 NOTE — Therapy (Signed)
 OUTPATIENT PHYSICAL THERAPY LOWER EXTREMITY EVALUATION   Patient Name: Timothy Brewer MRN: [id number] DOB:Jul 16, 1940, 83 y.o., male Today's Date: 04/14/2023  END OF SESSION:  PT End of Session - 04/14/23 0840     Visit Number 1    Number of Visits 18    Date for PT Re-Evaluation 07/07/23    Authorization Type HTA    PT Start Time 0845    PT Stop Time 0925    PT Time Calculation (min) 40 min    Equipment Utilized During Treatment Gait belt    Activity Tolerance Patient tolerated treatment well    Behavior During Therapy WFL for tasks assessed/performed             Past Medical History:  Diagnosis Date   Arthritis    Diabetes mellitus without complication (HCC)    Hypertension    Memory loss    Pre-diabetes    on metformin    Stroke [facility])    Past Surgical History:  Procedure Laterality Date   BACK SURGERY     c3,c4,c5,c6 ,l4,l5   EYE SURGERY     bil cataracts   NASAL SEPTUM SURGERY     TONSILLECTOMY     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   Procedure: LEFT TOTAL HIP ARTHROPLASTY ANTERIOR APPROACH;  Surgeon: Ernie Cough, MD;  Location: [facility] ORS;  Service: Orthopedics;  Laterality: Left;  70 mins   Patient Active Problem List   Diagnosis Date Noted   Acute CVA (cerebrovascular accident) (HCC) 03/25/2023   Type 2 diabetes mellitus (HCC) 03/25/2023   Hyperkalemia 09/12/2020   Type 2 diabetes mellitus with hyperglycemia, without long-term current use of insulin  (HCC) 09/12/2020   Hypertension associated with diabetes (HCC) 09/12/2020   GERD without esophagitis 09/12/2020   Hyperlipidemia associated with type 2 diabetes mellitus (HCC) 09/12/2020   Chronic diastolic (congestive) heart failure (HCC) 09/12/2020   TIA (transient ischemic attack) 06/03/2020   Aphasia    Right leg weakness    CVA (cerebral vascular accident) (HCC) 06/07/2017   Overweight (BMI 25.0-29.9) 02/18/2017   S/P left THA, 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  Has had right sided weakness and sensation issues since the stroke.  Reports he has not needed to use his cane or walker but does feel a little off balance.  Reports he had an almost fall a couple days ago as his leg was really numb but that has since improved.  Reports he is most concerned about his balance.  He was previously going to Silver sneakers at the gym and would like to get back to this.  Otherwise patient is not very active at home in regards to working out.    PERTINENT HISTORY: DB, HTN, neck surgery, back surgery, L THA PAIN:  no  PRECAUTIONS: None  RED FLAGS: None   WEIGHT BEARING RESTRICTIONS: No  FALLS:  Has patient fallen in last 6 months? No had one almost fall 2 days ago with right numbness in leg  LIVING ENVIRONMENT: Lives with: lives with their family Lives in:  House/apartment Stairs: No Has following equipment at home: Single point cane, Walker - 2 wheeled, and Grab bars  OCCUPATION: retired  PLOF: Independent, like to cook and read.  PATIENT GOALS: Will improve balance and strength  NEXT MD VISIT: 06/01/23  OBJECTIVE:  Note: Objective measures were completed at Evaluation unless otherwise noted.    COGNITION: Overall cognitive status: Within functional limits for tasks assessed     SENSATION: Not tested  EDEMA:  None noted in lower extremities        LE Measurements Lower Extremity Right EVAL Left EVAL   A/PROM MMT A/PROM MMT  Hip Flexion  3+  4-  Hip Extension      Hip  Abduction      Hip Adduction      Hip Internal rotation      Hip External rotation      Knee Flexion  3+  4  Knee Extension  4-  4+  Ankle Dorsiflexion  3+  4+  Ankle Plantarflexion      Ankle Inversion      Ankle Eversion       (Blank rows = not tested) * pain   FUNCTIONAL TESTS:  5x STS- 20.48 second no UE support  SLS R 5 seconds mod sway, L SLS unable  Left Tandem 16 second mod sway, tandem right posterior 5 seconds with mod sway  GAIT: Distance walked: 50 ft  Assistive device utilized: None Level of assistance: Modified independence Comments: wide  BOS - slight deviation from intended path                                                                                                                                TREATMENT DATE:   04/14/2023  Therapeutic Exercise:  Aerobic: Supine: Prone:  Seated: LAQs x10 5 holds R  Standing: Neuromuscular Re-education: Manual Therapy: Therapeutic Activity: Self Care: Trigger Point Dry Needling:  Modalities:    PATIENT EDUCATION:  Education details: on current presentation, on HEP, on clinical outcomes score and POC Person educated: Patient Education method: Programmer, Multimedia, Demonstration, and Handouts Education comprehension: verbalized understanding   HOME EXERCISE PROGRAM: [id number]  ASSESSMENT:  CLINICAL IMPRESSION: Patient presents to physical therapy with complaints of right lower extremity weakness, gait issues and balance issues after a CVA on March 25, 2023.  Patient also presents with right upper extremity numbness and fine motor loss, encouraged patient to follow-up with primary care provider in regards to Occupational Therapy order.  Patient presents with dynamic and static balance deficits along with right lower extremity weakness.  Educated patient on current presentation as well as plan of care moving forward.  Patient would greatly benefit from skilled PT to improve overall functional strength and reduce  fall risk at home and in the community.  OBJECTIVE IMPAIRMENTS: Abnormal gait, decreased activity tolerance, decreased mobility, difficulty walking, decreased ROM, decreased strength, decreased safety awareness, and improper body mechanics.   ACTIVITY LIMITATIONS: standing,  squatting, stairs, transfers, and locomotion level  PARTICIPATION LIMITATIONS: meal prep, cleaning, community activity, and church  PERSONAL FACTORS: 1 comorbidity: DB  are also affecting patient's functional outcome.   REHAB POTENTIAL: Good  CLINICAL DECISION MAKING: Stable/uncomplicated  EVALUATION COMPLEXITY: Low   GOALS: Goals reviewed with patient? yes  SHORT TERM GOALS: Target date: 05/26/2023   Patient will be independent in self management strategies to improve quality of life and functional outcomes. Baseline: New Program Goal status: INITIAL  2.  Patient will report at least 50% improvement in overall symptoms and/or function to demonstrate improved functional mobility Baseline: 0% better Goal status: INITIAL  3.  Patient will be able to demonstrate 5 times sit to stand without the use of upper extremity support in less than 16 seconds to demonstrate reduced fall risk Baseline: See above Goal status: INITIAL    LONG TERM GOALS: Target date: 07/07/2023    Patient will report at least 75% improvement in overall symptoms and/or function to demonstrate improved functional mobility Baseline: 0% better Goal status: INITIAL  2.  Patient will be able to demonstrate tandem with either leg in back and no upper extremity support for at least 30 seconds to demonstrate improved static balance Baseline: see above Goal status: INITIAL  3.  Patient will score with at least 20 out of 24 on DGI to demonstrate improved dynamic balance Baseline: See above Goal status: INITIAL  4.  Patient will report walking regularly to improve overall function and ambulatory mobility Baseline: Not currently Goal status:  INITIAL    PLAN:  PT FREQUENCY:1- 2x/week for total of 18 visits over 12 weeks certification.  PT DURATION: 12 weeks  PLANNED INTERVENTIONS: 97110-Therapeutic exercises, 97530- Therapeutic activity, 97112- Neuromuscular re-education, [phone]- Self Care, [id number]- Manual therapy, [phone]- Gait training, [phone]- Orthotic Fit/training, [phone]- Canalith repositioning, [id number]- Aquatic Therapy, [id number]- Electrical stimulation (unattended), [phone]- Ionotophoresis 4mg /ml Dexamethasone , Patient/Family education, Balance training, Stair training, Taping, Dry Needling, Joint mobilization, Joint manipulation, Spinal manipulation, Spinal mobilization, Cryotherapy, and Moist heat   PLAN FOR NEXT SESSION: Develop home exercise program focusing on lower extremity strength follow-up with order for occupational therapy, focus on static and dynamic balance   9:49 AM, 04/14/23 Olivia Church, DPT Physical Therapy [facility]

## 2023-04-14 ENCOUNTER — Ambulatory Visit: Payer: PPO | Admitting: Physical Therapy

## 2023-04-14 ENCOUNTER — Encounter: Payer: Self-pay | Admitting: Physical Therapy

## 2023-04-14 DIAGNOSIS — M6281 Muscle weakness (generalized): Secondary | ICD-10-CM | POA: Diagnosis not present

## 2023-04-14 DIAGNOSIS — I6300 Cerebral infarction due to thrombosis of unspecified precerebral artery: Secondary | ICD-10-CM | POA: Diagnosis not present

## 2023-04-14 DIAGNOSIS — R262 Difficulty in walking, not elsewhere classified: Secondary | ICD-10-CM

## 2023-04-15 NOTE — Progress Notes (Signed)
 Cardiology Office Note:  .   Date:  04/16/2023  ID:  Timothy Brewer Search, DOB [birth year], MRN [id number] PCP: Seabron Lenis, MD  [facility] Providers Cardiologist:  None { History of Present Illness: .    Chief Complaint  Patient presents with   Follow-up    Post hospital.    Timothy Brewer is a 83 y.o. male with history of small vessel cerebrovascular disease, HTN, HLD who presents for follow-up. Recent admission for L thalamic infarction.   History of Present Illness   Timothy Brewer is an 83 year old male with a history of stroke, hypertension, and hyperlipidemia who presents for follow-up after a recent stroke.  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 He is currently undergoing physical therapy and has noted improvement in his foot and right leg. However, he continues to experience tingling and numbness in his forefinger, extending upwards, described as feeling like a bandage is on it.  He has a history of hyperlipidemia and is currently taking atorvastatin  20 mg daily. His most recent LDL level was 72. He has been on atorvastatin  for some time and reports that his cholesterol levels are good.  He has a history of hypertension and is taking valsartan  40 mg daily. His blood pressure is reported as perfect. No chest pain or trouble breathing.  He has a family history of diabetes, with all family members being diabetic, and he describes himself as borderline diabetic. He also mentions that his grandmother had a stroke.  He is retired from [employer] and does not smoke. He is currently not exercising regularly but is participating in physical therapy.          Problem List DM -A1c 5.6 HLD -T chol 156, HDL 70, LDL 72, TG 68 HTN Carotid artery disease -L ICA 40-59% 06/2021 -70% R ICA (MRA 03/26/2023) -50% L ICA (MRA 03/26/2023) 5. CVA -L lateral thalamic infarct  (lacunar)    ROS: All other ROS reviewed and negative. Pertinent positives noted in the HPI.     Studies Reviewed: SABRA        MRA Neck 03/26/2023 IMPRESSION: 1. 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 2. A 50% stenosis of the proximal left ICA. 3. No intracranial large vessel occlusion or high-grade stenosis.  Carotid US  07/18/2022 Summary:  Right Carotid: Velocities in the right ICA are consistent with a 1-39%  stenosis.   Left Carotid: Velocities in the left ICA are consistent with a 40-59%  stenosis.   Vertebrals: Bilateral vertebral arteries demonstrate antegrade flow.  Subclavians: Left subclavian artery was stenotic. Normal flow hemodynamics  were              seen in the right subclavian artery.   *See table(s) above for measurements and observations.   TTE 03/26/2023  1. Left ventricular ejection fraction, by estimation, is 50 to 55%. The  left ventricle has low normal function. The left ventricle demonstrates  global hypokinesis. There is mild left ventricular hypertrophy. Left  ventricular diastolic parameters are  consistent with Grade I diastolic dysfunction (impaired relaxation).   2. Right ventricular systolic function is normal. The right ventricular  size is normal.   3. The mitral valve is degenerative. Trivial mitral valve regurgitation.  No evidence of mitral stenosis.   4. The aortic valve is tricuspid. Aortic valve regurgitation is not  visualized. No aortic stenosis is present.  Physical Exam:   VS:  BP 114/72 (BP Location: Left Arm, Patient Position: Sitting, Cuff Size: Normal)   Pulse (!) 59   Ht 5' 6.5 (1.689 m)   Wt 172 lb (78 kg)   BMI 27.35 kg/m    Wt Readings from Last 3 Encounters:  04/16/23 172 lb (78 kg)  01/12/23 165 lb 12.8 oz (75.2 kg)  01/06/23 165 lb 12.8 oz (75.2 kg)    GEN: Well nourished, well developed in no acute distress NECK: No JVD; No carotid bruits CARDIAC: RRR, no murmurs, rubs, gallops RESPIRATORY:  Clear to  auscultation without rales, wheezing or rhonchi  ABDOMEN: Soft, non-tender, non-distended EXTREMITIES:  No edema; No deformity  ASSESSMENT AND PLAN: .   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 Currently in physical therapy with improvement in right leg and foot function, persistent tingling and numbness in right forefinger. -Continue physical therapy. -Continue ASA 81 mg daily  Hyperlipidemia LDL of 72, currently on Atorvastatin  20mg  daily. Discussed the benefits of lowering LDL to less than 55 in the context of recent stroke. -Increase Atorvastatin  to 40mg  daily.  Carotid Artery Disease Discrepancies between MRA and ultrasound findings, but stroke not attributed to carotid disease. No significant bruit on exam. -L ICA 40-59% by US  and 50% by MRA. 70% R ICA by MRA.  -Continue current management. -Repeat carotid ultrasounds in 1 year.  Hypertension Well-controlled on current regimen. -Continue Valsartan  40mg  daily.  Follow-up -Return visit in 1 year with carotid ultrasounds prior to appointment.              Follow-up: Return in about 1 year (around 04/15/2024).  Signed, Darryle DASEN. Barbaraann, MD, [facility]  [facility]  [address], [address] [place], [place] [address] [phone]  11:32 AM

## 2023-04-15 NOTE — Therapy (Signed)
 OUTPATIENT PHYSICAL THERAPY LOWER EXTREMITY TREATMENT   Patient Name: Timothy Brewer MRN: [id number] DOB:Oct 10, 1940, 83 y.o., male Today's Date: 04/16/2023  END OF SESSION:  PT End of Session - 04/16/23 0759     Visit Number 2    Number of Visits 18    Date for PT Re-Evaluation 07/07/23    Authorization Type HTA    PT Start Time 0803    PT Stop Time 0841    PT Time Calculation (min) 38 min    Equipment Utilized During Treatment Gait belt    Activity Tolerance Patient tolerated treatment well    Behavior During Therapy WFL for tasks assessed/performed              Past Medical History:  Diagnosis Date   Arthritis    Diabetes mellitus without complication (HCC)    Hypertension    Memory loss    Pre-diabetes    on metformin    Stroke [facility])    Past Surgical History:  Procedure Laterality Date   BACK SURGERY     c3,c4,c5,c6 ,l4,l5   EYE SURGERY     bil cataracts   NASAL SEPTUM SURGERY     TONSILLECTOMY     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   Procedure: LEFT TOTAL HIP ARTHROPLASTY ANTERIOR APPROACH;  Surgeon: Ernie Cough, MD;  Location: [facility];  Service: Orthopedics;  Laterality: Left;  70 mins   Patient Active Problem List   Diagnosis Date Noted   Acute CVA (cerebrovascular accident) (HCC) 03/25/2023   Type 2 diabetes mellitus (HCC) 03/25/2023   Hyperkalemia 09/12/2020   Type 2 diabetes mellitus with hyperglycemia, without long-term current use of insulin  (HCC) 09/12/2020   Hypertension associated with diabetes (HCC) 09/12/2020   GERD without esophagitis 09/12/2020   Hyperlipidemia associated with type 2 diabetes mellitus (HCC) 09/12/2020   Chronic diastolic (congestive) heart failure (HCC) 09/12/2020   TIA (transient ischemic attack) 06/03/2020   Aphasia    Right leg weakness    CVA (cerebral vascular accident) (HCC) 06/07/2017   Overweight (BMI 25.0-29.9) 02/18/2017   S/P left THA, 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   Eval: Patient is status post CVA on March 25, 2023.  Has had right sided weakness and sensation issues since the stroke.  Reports he has not needed to use his cane or walker but does feel a little off balance.  Reports he had an almost fall a couple days ago as his leg was really numb but that has since improved.  Reports he is most concerned about his balance.  He was previously going to Silver sneakers at the gym and would like to get back to this.  Otherwise patient is not very active at home in regards to working out.    PERTINENT HISTORY: DB, HTN, neck surgery, back surgery, L THA PAIN:  no  PRECAUTIONS: None  RED FLAGS: None   WEIGHT BEARING RESTRICTIONS: No  FALLS:  Has patient fallen in last 6 months? No had one almost fall 2 days ago with right numbness in leg  LIVING ENVIRONMENT: Lives with: lives with their family Lives in: House/apartment Stairs: No Has following equipment at home: Single point cane, Environmental Consultant - 2 wheeled, and Grab bars  OCCUPATION: retired  PLOF: Independent, like to the [employer] and read.  PATIENT GOALS: Will improve balance and strength  NEXT MD VISIT: 06/01/23  OBJECTIVE:  Note: Objective measures were completed at Evaluation unless otherwise noted.    COGNITION: Overall cognitive status: Within functional limits for tasks assessed     SENSATION: Not tested  EDEMA:  None noted in lower extremities        LE Measurements Lower Extremity Right EVAL Left EVAL   A/PROM MMT  A/PROM MMT  Hip Flexion  3+  4-  Hip Extension      Hip Abduction      Hip Adduction      Hip Internal rotation      Hip External rotation      Knee Flexion  3+  4  Knee Extension  4-  4+  Ankle Dorsiflexion  3+  4+  Ankle Plantarflexion      Ankle Inversion      Ankle Eversion       (Blank rows = not tested) * pain   FUNCTIONAL TESTS:  5x STS- 20.48 second no UE support  SLS R 5 seconds mod sway, L SLS unable  Left Tandem 16 second mod sway, tandem right posterior 5 seconds with mod sway  GAIT: Distance walked: 50 ft  Assistive device utilized: None Level of assistance: Modified independence Comments: wide  BOS - slight deviation from intended path                                                                                                                                TREATMENT DATE:   04/16/2023 Therapeutic Exercise:  Aerobic: Supine: Prone:  Seated: LAQs x10 5 holds R, STS x10  Standing:hamstring curls 2--> 5 holds 3x10 B with 2 hand support, calf raises with 5 holds 2x10 B Neuromuscular Re-education: marching with one hand support for balance 4x5 5 holds B, backwards walking with light support on counter 3 minutes, side stepping 3 minutes  Manual Therapy: Therapeutic Activity: Self Care: Trigger Point Dry Needling:  Modalities:    PATIENT EDUCATION:  Education details: on HEP Person educated: Patient Education method: Explanation, Facilities Manager, and Handouts Education comprehension: verbalized understanding   HOME EXERCISE PROGRAM: [id number]  ASSESSMENT:  CLINICAL IMPRESSION: 04/16/2023 Session focused on review of previous exercises as well as progression of exercises.  Slight challenge with backwards walking but cued patient to have small support on table to reduce risk of loss of balance while practicing at home.  No pain but fatigue noted in legs.  Initially cramping in hamstring but this resolved with repetition of standing hamstring curl.   Overall patient doing well we will continue benefit from skilled PT at this time.  EVAL: Patient presents to physical therapy with complaints of  right lower extremity weakness, gait issues and balance issues after a CVA on March 25, 2023.  Patient also presents with right upper extremity numbness and fine motor loss, encouraged patient to follow-up with primary care provider in regards to Occupational Therapy order.  Patient presents with dynamic and static balance deficits along with right lower extremity weakness.  Educated patient on current presentation as well as plan of care moving forward.  Patient would greatly benefit from skilled PT to improve overall functional strength and reduce fall risk at home and in the community.  OBJECTIVE IMPAIRMENTS: Abnormal gait, decreased activity tolerance, decreased mobility, difficulty walking, decreased ROM, decreased strength, decreased safety awareness, and improper body mechanics.   ACTIVITY LIMITATIONS: standing, squatting, stairs, transfers, and locomotion level  PARTICIPATION LIMITATIONS: meal prep, cleaning, community activity, and church  PERSONAL FACTORS: 1 comorbidity: DB  are also affecting patient's functional outcome.   REHAB POTENTIAL: Good  CLINICAL DECISION MAKING: Stable/uncomplicated  EVALUATION COMPLEXITY: Low   GOALS: Goals reviewed with patient? yes  SHORT TERM GOALS: Target date: 05/26/2023   Patient will be independent in self management strategies to improve quality of life and functional outcomes. Baseline: New Program Goal status: INITIAL  2.  Patient will report at least 50% improvement in overall symptoms and/or function to demonstrate improved functional mobility Baseline: 0% better Goal status: INITIAL  3.  Patient will be able to demonstrate 5 times sit to stand without the use of upper extremity support in less than 16 seconds to demonstrate reduced fall risk Baseline: See above Goal status:  INITIAL    LONG TERM GOALS: Target date: 07/07/2023    Patient will report at least 75% improvement in overall symptoms and/or function to demonstrate improved functional mobility Baseline: 0% better Goal status: INITIAL  2.  Patient will be able to demonstrate tandem with either leg in back and no upper extremity support for at least 30 seconds to demonstrate improved static balance Baseline: see above Goal status: INITIAL  3.  Patient will score with at least 20 out of 24 on DGI to demonstrate improved dynamic balance Baseline: See above Goal status: INITIAL  4.  Patient will report walking regularly to improve overall function and ambulatory mobility Baseline: Not currently Goal status: INITIAL    PLAN:  PT FREQUENCY:1- 2x/week for total of 18 visits over 12 weeks certification.  PT DURATION: 12 weeks  PLANNED INTERVENTIONS: 97110-Therapeutic exercises, 97530- Therapeutic activity, [id number]- Neuromuscular re-education, [phone]- Self Care, 02859- Manual therapy, [phone]- Gait training, [phone]- Orthotic Fit/training, [phone]- Canalith repositioning, [id number]- Aquatic Therapy, 97014- Electrical stimulation (unattended), [phone]- Ionotophoresis 4mg /ml Dexamethasone , Patient/Family education, Balance training, Stair training, Taping, Dry Needling, Joint mobilization, Joint manipulation, Spinal manipulation, Spinal mobilization, Cryotherapy, and Moist heat   PLAN FOR NEXT SESSION: Develop home exercise program focusing on lower extremity strength follow-up with order for occupational therapy, focus on static and dynamic balance   8:45 AM, 04/16/23 Olivia Church, DPT Physical Therapy with [facility]

## 2023-04-16 ENCOUNTER — Ambulatory Visit: Payer: PPO | Admitting: Physical Therapy

## 2023-04-16 ENCOUNTER — Encounter: Payer: Self-pay | Admitting: Physical Therapy

## 2023-04-16 ENCOUNTER — Ambulatory Visit: Payer: PPO | Attending: Cardiovascular Disease | Admitting: Cardiovascular Disease

## 2023-04-16 ENCOUNTER — Encounter: Payer: Self-pay | Admitting: Cardiovascular Disease

## 2023-04-16 VITALS — BP 114/72 | HR 59 | Ht 66.5 in | Wt 172.0 lb

## 2023-04-16 DIAGNOSIS — I679 Cerebrovascular disease, unspecified: Secondary | ICD-10-CM | POA: Diagnosis not present

## 2023-04-16 DIAGNOSIS — E782 Mixed hyperlipidemia: Secondary | ICD-10-CM

## 2023-04-16 DIAGNOSIS — M6281 Muscle weakness (generalized): Secondary | ICD-10-CM | POA: Diagnosis not present

## 2023-04-16 DIAGNOSIS — I6522 Occlusion and stenosis of left carotid artery: Secondary | ICD-10-CM | POA: Diagnosis not present

## 2023-04-16 DIAGNOSIS — I6300 Cerebral infarction due to thrombosis of unspecified precerebral artery: Secondary | ICD-10-CM | POA: Diagnosis not present

## 2023-04-16 DIAGNOSIS — R262 Difficulty in walking, not elsewhere classified: Secondary | ICD-10-CM

## 2023-04-16 MED ORDER — ATORVASTATIN CALCIUM 40 MG PO TABS: 40.0000 mg | ORAL_TABLET | Freq: Every day | ORAL | 2 refills | Status: AC

## 2023-04-16 NOTE — Patient Instructions (Addendum)
 Medication Instructions:  - START LIPITOR 40MG  DAILY    *If you need a refill on your cardiac medications before your next appointment, please call your pharmacy*   Lab Work: NONE    If you have labs (blood work) drawn today and your tests are completely normal, you will receive your results only by: MyChart Message (if you have MyChart) OR A paper copy in the mail If you have any lab test that is abnormal or we need to change your treatment, we will call you to review the results.   Testing/Procedures: Your physician has requested that you have a carotid duplex BEFORE YOUR NEXT APPOINTMENT IN 1 YEAR. This test is an ultrasound of the carotid arteries in your neck. It looks at blood flow through these arteries that supply the brain with blood. Allow one hour for this exam. There are no restrictions or special instructions.    Follow-Up: At [facility], you and your health needs are our priority.  As part of our continuing mission to provide you with exceptional heart care, we have created designated Provider Care Teams.  These Care Teams include your primary Cardiologist (physician) and Advanced Practice Providers (APPs -  Physician Assistants and Nurse Practitioners) who all work together to provide you with the care you need, when you need it.  We recommend signing up for the patient portal called MyChart.  Sign up information is provided on this After Visit Summary.  MyChart is used to connect with patients for Virtual Visits (Telemedicine).  Patients are able to view lab/test results, encounter notes, upcoming appointments, etc.  Non-urgent messages can be sent to your provider as well.   To learn more about what you can do with MyChart, go to [email or web address].    Your next appointment:   1 year(s)  The format for your next appointment:   In Person  Provider:   Darryle Decent, MD     Other Instructions

## 2023-04-21 ENCOUNTER — Encounter: Payer: Self-pay | Admitting: Podiatry

## 2023-04-21 ENCOUNTER — Ambulatory Visit: Payer: PPO | Admitting: Podiatry

## 2023-04-21 DIAGNOSIS — B351 Tinea unguium: Secondary | ICD-10-CM | POA: Diagnosis not present

## 2023-04-21 NOTE — Progress Notes (Signed)
      Chief Complaint  Patient presents with   Nail Problem    fungal nail recheck - using ciclopirox   HPI: 83 y.o. male presents today for fungal nail recheck.  He's using Rx ciclopirox solution to the left great toenail.  States he has no difficulty removing it at the end of the week.    Past Medical History:  Diagnosis Date   Arthritis    Diabetes mellitus without complication (HCC)    Hyperlipidemia    Hypertension    Memory loss    Pre-diabetes    on metformin   Stroke [facility])     Past Surgical History:  Procedure Laterality Date   BACK SURGERY     c3,c4,c5,c6 ,l4,l5   EYE SURGERY     bil cataracts   NASAL SEPTUM SURGERY     TONSILLECTOMY     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   Procedure: LEFT TOTAL HIP ARTHROPLASTY ANTERIOR APPROACH;  Surgeon: Durene Romans, MD;  Location: [facility];  Service: Orthopedics;  Laterality: Left;  70 mins    Allergies  Allergen Reactions   Brilinta [Ticagrelor] Shortness Of Breath   Pantoprazole Hives   Rosuvastatin Other (See Comments)    Other Reaction(s): memory fog   Iodine Hives and Other (See Comments)    Internal only    Physical Exam: Palpable pedal pulses.  Left hallux nail shows approximately 20% clearing near the proximal nail fold.  No skin irritation noted peri-ungual area from the medication.   Assessment/Plan of Care: 1. Onychomycosis of great toe     Discussed clinical findings with patient today.  Informed pt that nail fungus in showing improvement near the proximal nail fold.  Continue with ciclopirox solution 8% every day to the nail(s).  He may start applying it to the right 3rd toenail as well.  The nail was debrided to decrease bulk.    F/u 3-4 months for fungal nail recheck.    Clerance Lav, DPM, FACFAS [facility]     [address] [place], [place] [address]                Office [phone]  Fax [phone]

## 2023-04-22 ENCOUNTER — Encounter: Payer: Self-pay | Admitting: Physical Therapy

## 2023-04-22 ENCOUNTER — Ambulatory Visit: Payer: PPO | Admitting: Physical Therapy

## 2023-04-22 DIAGNOSIS — R262 Difficulty in walking, not elsewhere classified: Secondary | ICD-10-CM

## 2023-04-22 DIAGNOSIS — I6300 Cerebral infarction due to thrombosis of unspecified precerebral artery: Secondary | ICD-10-CM

## 2023-04-22 DIAGNOSIS — M6281 Muscle weakness (generalized): Secondary | ICD-10-CM

## 2023-04-22 NOTE — Therapy (Signed)
 OUTPATIENT PHYSICAL THERAPY LOWER EXTREMITY TREATMENT   Patient Name: Timothy Brewer MRN: [id number] DOB:08/08/1940, 83 y.o., male Today's Date: 04/22/2023  END OF SESSION:  PT End of Session - 04/22/23 1346     Visit Number 3    Number of Visits 18    Date for PT Re-Evaluation 07/07/23    Authorization Type HTA    PT Start Time 1347    PT Stop Time 1425    PT Time Calculation (min) 38 min    Equipment Utilized During Treatment Gait belt    Activity Tolerance Patient tolerated treatment well    Behavior During Therapy WFL for tasks assessed/performed              Past Medical History:  Diagnosis Date   Arthritis    Diabetes mellitus without complication (HCC)    Hyperlipidemia    Hypertension    Memory loss    Pre-diabetes    on metformin   Stroke [facility])    Past Surgical History:  Procedure Laterality Date   BACK SURGERY     c3,c4,c5,c6 ,l4,l5   EYE SURGERY     bil cataracts   NASAL SEPTUM SURGERY     TONSILLECTOMY     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   Procedure: LEFT TOTAL HIP ARTHROPLASTY ANTERIOR APPROACH;  Surgeon: Durene Romans, MD;  Location: [facility];  Service: Orthopedics;  Laterality: Left;  70 mins   Patient Active Problem List   Diagnosis Date Noted   Acute CVA (cerebrovascular accident) (HCC) 03/25/2023   Type 2 diabetes mellitus (HCC) 03/25/2023   Hyperkalemia 09/12/2020   Type 2 diabetes mellitus with hyperglycemia, without long-term current use of insulin (HCC) 09/12/2020   Hypertension associated with diabetes (HCC) 09/12/2020   GERD without esophagitis 09/12/2020   Hyperlipidemia associated with type 2 diabetes mellitus (HCC) 09/12/2020   Chronic diastolic (congestive) heart failure (HCC) 09/12/2020   TIA (transient ischemic attack) 06/03/2020   Aphasia    Right leg weakness    CVA (cerebral vascular accident) (HCC) 06/07/2017   Overweight (BMI 25.0-29.9) 02/18/2017   S/P left THA, 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   Eval: Patient is status post CVA on March 25, 2023.  Has had right sided weakness and sensation issues since the stroke.  Reports he has not needed to use his cane or walker but does feel a little off balance.  Reports he had an almost fall a couple days ago as his leg was really numb but that has since improved.  Reports he is most concerned about his balance.  He was previously going to Silver sneakers at the gym and would like to get back to this.  Otherwise patient is not very active at home in regards to working out.    PERTINENT HISTORY: DB, HTN, neck surgery, back surgery, L THA PAIN:  no  PRECAUTIONS: None  RED FLAGS: None   WEIGHT BEARING RESTRICTIONS: No  FALLS:  Has patient fallen in last 6 months? No had one almost fall 2 days ago  with right numbness in leg  LIVING ENVIRONMENT: Lives with: lives with their family Lives in: House/apartment Stairs: No Has following equipment at home: Single point cane, Environmental consultant - 2 wheeled, and Grab bars  OCCUPATION: retired  PLOF: Independent, like to cook and read.  PATIENT GOALS: Will improve balance and strength  NEXT MD VISIT: 06/01/23  OBJECTIVE:  Note: Objective measures were completed at Evaluation unless otherwise noted.    COGNITION: Overall cognitive status: Within functional limits for tasks assessed     SENSATION: Not tested  EDEMA:  None noted in lower extremities        LE Measurements Lower Extremity  Right EVAL Left EVAL   A/PROM MMT A/PROM MMT  Hip Flexion  3+  4-  Hip Extension      Hip Abduction      Hip Adduction      Hip Internal rotation      Hip External rotation      Knee Flexion  3+  4  Knee Extension  4-  4+  Ankle Dorsiflexion  3+  4+  Ankle Plantarflexion      Ankle Inversion      Ankle Eversion       (Blank rows = not tested) * pain   FUNCTIONAL TESTS:  5x STS- 20.48 second no UE support  SLS R 5 seconds mod sway, L SLS unable  Left Tandem 16 second mod sway, tandem right posterior 5 seconds with mod sway  GAIT: Distance walked: 50 ft  Assistive device utilized: None Level of assistance: Modified independence Comments: wide  BOS - slight deviation from intended path                                                                                                                                TREATMENT DATE:   04/22/2023 Therapeutic Exercise:  Bike L1 5 minutes - focus on pushing with right leg  Supine: Prone:  Seated: LAQs 3x5 5" 3# B, STS x15 slow and controlled  Standing:hamstring curls 2"--> 5" holds 3x10 B with 2 hand support, calf raises with 5" holds 2x10 B Neuromuscular Re-education: staggered balance x3 30" holds 6" step no UE support, marching facing wall on foam pad - too challenging - changed to 2 hand support at railings 4x5 slow and controlled, tandem on floor occ UE assist and SBA/CGA as needed x3 30" holds B   Manual Therapy: Therapeutic Activity: Self Care: Trigger Point Dry Needling:  Modalities:    PATIENT EDUCATION:  Education details: on HEP Person educated: Patient Education method: Explanation, Facilities manager, and Handouts Education comprehension: verbalized understanding   HOME EXERCISE PROGRAM: [id number]  ASSESSMENT:  CLINICAL IMPRESSION: 04/22/2023 Session focused on balance and strengthening. Fatigue with balance exercise sand occasional CGA for loss of balance. No new exercises to HEP. Overall strength is getting  better but balance continues to be challenging. Will continue with current POC as tolerated.   EVAL: Patient  presents to physical therapy with complaints of right lower extremity weakness, gait issues and balance issues after a CVA on March 25, 2023.  Patient also presents with right upper extremity numbness and fine motor loss, encouraged patient to follow-up with primary care provider in regards to Occupational Therapy order.  Patient presents with dynamic and static balance deficits along with right lower extremity weakness.  Educated patient on current presentation as well as plan of care moving forward.  Patient would greatly benefit from skilled PT to improve overall functional strength and reduce fall risk at home and in the community.  OBJECTIVE IMPAIRMENTS: Abnormal gait, decreased activity tolerance, decreased mobility, difficulty walking, decreased ROM, decreased strength, decreased safety awareness, and improper body mechanics.   ACTIVITY LIMITATIONS: standing, squatting, stairs, transfers, and locomotion level  PARTICIPATION LIMITATIONS: meal prep, cleaning, community activity, and church  PERSONAL FACTORS: 1 comorbidity: DB  are also affecting patient's functional outcome.   REHAB POTENTIAL: Good  CLINICAL DECISION MAKING: Stable/uncomplicated  EVALUATION COMPLEXITY: Low   GOALS: Goals reviewed with patient? yes  SHORT TERM GOALS: Target date: 05/26/2023   Patient will be independent in self management strategies to improve quality of life and functional outcomes. Baseline: New Program Goal status: INITIAL  2.  Patient will report at least 50% improvement in overall symptoms and/or function to demonstrate improved functional mobility Baseline: 0% better Goal status: INITIAL  3.  Patient will be able to demonstrate 5 times sit to stand without the use of upper extremity support in less than 16 seconds to demonstrate reduced fall risk Baseline: See above Goal status:  INITIAL    LONG TERM GOALS: Target date: 07/07/2023    Patient will report at least 75% improvement in overall symptoms and/or function to demonstrate improved functional mobility Baseline: 0% better Goal status: INITIAL  2.  Patient will be able to demonstrate tandem with either leg in back and no upper extremity support for at least 30 seconds to demonstrate improved static balance Baseline: see above Goal status: INITIAL  3.  Patient will score with at least 20 out of 24 on DGI to demonstrate improved dynamic balance Baseline: See above Goal status: INITIAL  4.  Patient will report walking regularly to improve overall function and ambulatory mobility Baseline: Not currently Goal status: INITIAL    PLAN:  PT FREQUENCY:1- 2x/week for total of 18 visits over 12 weeks certification.  PT DURATION: 12 weeks  PLANNED INTERVENTIONS: 97110-Therapeutic exercises, 97530- Therapeutic activity, [id number]- Neuromuscular re-education, [phone]- Self Care, 96295- Manual therapy, [phone]- Gait training, [phone]- Orthotic Fit/training, [phone]- Canalith repositioning, [id number]- Aquatic Therapy, 97014- Electrical stimulation (unattended), [phone]- Ionotophoresis 4mg /ml Dexamethasone, Patient/Family education, Balance training, Stair training, Taping, Dry Needling, Joint mobilization, Joint manipulation, Spinal manipulation, Spinal mobilization, Cryotherapy, and Moist heat   PLAN FOR NEXT SESSION: Develop home exercise program focusing on lower extremity strength follow-up with order for occupational therapy, focus on static and dynamic balance   2:27 PM, 04/22/23 Tereasa Coop, DPT Physical Therapy with [facility]

## 2023-04-27 ENCOUNTER — Ambulatory Visit: Payer: PPO | Admitting: Physical Therapy

## 2023-04-27 ENCOUNTER — Encounter: Payer: Self-pay | Admitting: Physical Therapy

## 2023-04-27 DIAGNOSIS — R262 Difficulty in walking, not elsewhere classified: Secondary | ICD-10-CM

## 2023-04-27 DIAGNOSIS — I6300 Cerebral infarction due to thrombosis of unspecified precerebral artery: Secondary | ICD-10-CM | POA: Diagnosis not present

## 2023-04-27 DIAGNOSIS — M6281 Muscle weakness (generalized): Secondary | ICD-10-CM | POA: Diagnosis not present

## 2023-04-27 NOTE — Therapy (Signed)
 OUTPATIENT PHYSICAL THERAPY LOWER EXTREMITY TREATMENT   Patient Name: Timothy Brewer MRN: [id number] DOB:Jan 25, 1941, 83 y.o., male Today's Date: 04/27/2023  END OF SESSION:  PT End of Session - 04/27/23 1059     Visit Number 4    Number of Visits 18    Date for PT Re-Evaluation 07/07/23    Authorization Type HTA    PT Start Time 1100    PT Stop Time 1140    PT Time Calculation (min) 40 min    Equipment Utilized During Treatment Gait belt    Activity Tolerance Patient tolerated treatment well    Behavior During Therapy WFL for tasks assessed/performed              Past Medical History:  Diagnosis Date   Arthritis    Diabetes mellitus without complication (HCC)    Hyperlipidemia    Hypertension    Memory loss    Pre-diabetes    on metformin   Stroke [facility])    Past Surgical History:  Procedure Laterality Date   BACK SURGERY     c3,c4,c5,c6 ,l4,l5   EYE SURGERY     bil cataracts   NASAL SEPTUM SURGERY     TONSILLECTOMY     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   Procedure: LEFT TOTAL HIP ARTHROPLASTY ANTERIOR APPROACH;  Surgeon: Durene Romans, MD;  Location: [facility];  Service: Orthopedics;  Laterality: Left;  70 mins   Patient Active Problem List   Diagnosis Date Noted   Acute CVA (cerebrovascular accident) (HCC) 03/25/2023   Type 2 diabetes mellitus (HCC) 03/25/2023   Hyperkalemia 09/12/2020   Type 2 diabetes mellitus with hyperglycemia, without long-term current use of insulin (HCC) 09/12/2020   Hypertension associated with diabetes (HCC) 09/12/2020   GERD without esophagitis 09/12/2020   Hyperlipidemia associated with type 2 diabetes mellitus (HCC) 09/12/2020   Chronic diastolic (congestive) heart failure (HCC) 09/12/2020   TIA (transient ischemic attack) 06/03/2020   Aphasia    Right leg weakness    CVA (cerebral vascular accident) (HCC) 06/07/2017   Overweight (BMI 25.0-29.9) 02/18/2017   S/P left THA, 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 No difficulties with exercises.   Eval: Patient is status post CVA on March 25, 2023.  Has had right sided weakness and sensation issues since the stroke.  Reports he has not needed to use his cane or walker but does feel a little off balance.  Reports he had an almost fall a couple days ago as his leg was really numb but that has since improved.  Reports he is most concerned about his balance.  He was previously going to Silver sneakers at the gym and would like to get back to this.  Otherwise patient is not very active at home in regards to working out.    PERTINENT HISTORY: DB, HTN, neck surgery, back surgery, L THA PAIN:  no  PRECAUTIONS: None  RED FLAGS: None   WEIGHT BEARING RESTRICTIONS: No  FALLS:  Has patient fallen in last 6 months? No had one almost fall 2 days ago  with right numbness in leg  LIVING ENVIRONMENT: Lives with: lives with their family Lives in: House/apartment Stairs: No Has following equipment at home: Single point cane, Environmental consultant - 2 wheeled, and Grab bars  OCCUPATION: retired  PLOF: Independent, like to cook and read.  PATIENT GOALS: Will improve balance and strength  NEXT MD VISIT: 06/01/23  OBJECTIVE:  Note: Objective measures were completed at Evaluation unless otherwise noted.    COGNITION: Overall cognitive status: Within functional limits for tasks assessed     SENSATION: Not tested  EDEMA:  None noted in lower extremities        LE Measurements Lower Extremity  Right EVAL Left EVAL   A/PROM MMT A/PROM MMT  Hip Flexion  3+  4-  Hip Extension      Hip Abduction      Hip Adduction      Hip Internal rotation      Hip External rotation      Knee Flexion  3+  4  Knee Extension  4-  4+  Ankle Dorsiflexion  3+  4+  Ankle Plantarflexion      Ankle Inversion      Ankle Eversion       (Blank rows = not tested) * pain   FUNCTIONAL TESTS:  5x STS- 20.48 second no UE support  SLS R 5 seconds mod sway, L SLS unable  Left Tandem 16 second mod sway, tandem right posterior 5 seconds with mod sway  GAIT: Distance walked: 50 ft  Assistive device utilized: None Level of assistance: Modified independence Comments: wide  BOS - slight deviation from intended path                                                                                                                                TREATMENT DATE:   04/27/2023 Therapeutic Exercise:  Supine: Prone:  Seated: STS x15 --> 8# weight 4x5 slow and controlled, LAQs x5 15" 2# B,    Standing: Neuromuscular Re-education: step overs no hand support lateral cones 1.5 minute bouts x3 B, forward step overs 1.5 minute bouts x2 no UE support B, foot taps on balls on cones 2x5 B 1 hand support --> 2# weights - 2x5 B one hand support, SLS with one foot on stacked cones x3 20" holds SBA occ UE support.    Manual Therapy: Therapeutic Activity: Self Care: Trigger Point Dry Needling:  Modalities:    PATIENT EDUCATION:  Education details: on HEP Person educated: Patient Education method: Explanation, Demonstration, and Handouts Education comprehension: verbalized understanding   HOME EXERCISE PROGRAM: [id number]  ASSESSMENT:  CLINICAL IMPRESSION: 04/27/2023 Continues to require rest breaks throughout session. Patient doing well in regards to balance and strength. Focused on more purposeful motions which is challenging for patient at times. Will continue with current POC as tolerated.   EVAL: Patient  presents to physical therapy with complaints of right lower extremity weakness, gait issues and balance  issues after a CVA on March 25, 2023.  Patient also presents with right upper extremity numbness and fine motor loss, encouraged patient to follow-up with primary care provider in regards to Occupational Therapy order.  Patient presents with dynamic and static balance deficits along with right lower extremity weakness.  Educated patient on current presentation as well as plan of care moving forward.  Patient would greatly benefit from skilled PT to improve overall functional strength and reduce fall risk at home and in the community.  OBJECTIVE IMPAIRMENTS: Abnormal gait, decreased activity tolerance, decreased mobility, difficulty walking, decreased ROM, decreased strength, decreased safety awareness, and improper body mechanics.   ACTIVITY LIMITATIONS: standing, squatting, stairs, transfers, and locomotion level  PARTICIPATION LIMITATIONS: meal prep, cleaning, community activity, and church  PERSONAL FACTORS: 1 comorbidity: DB  are also affecting patient's functional outcome.   REHAB POTENTIAL: Good  CLINICAL DECISION MAKING: Stable/uncomplicated  EVALUATION COMPLEXITY: Low   GOALS: Goals reviewed with patient? yes  SHORT TERM GOALS: Target date: 05/26/2023   Patient will be independent in self management strategies to improve quality of life and functional outcomes. Baseline: New Program Goal status: INITIAL  2.  Patient will report at least 50% improvement in overall symptoms and/or function to demonstrate improved functional mobility Baseline: 0% better Goal status: INITIAL  3.  Patient will be able to demonstrate 5 times sit to stand without the use of upper extremity support in less than 16 seconds to demonstrate reduced fall risk Baseline: See above Goal status: INITIAL    LONG TERM GOALS: Target date: 07/07/2023    Patient will report at least 75% improvement in  overall symptoms and/or function to demonstrate improved functional mobility Baseline: 0% better Goal status: INITIAL  2.  Patient will be able to demonstrate tandem with either leg in back and no upper extremity support for at least 30 seconds to demonstrate improved static balance Baseline: see above Goal status: INITIAL  3.  Patient will score with at least 20 out of 24 on DGI to demonstrate improved dynamic balance Baseline: See above Goal status: INITIAL  4.  Patient will report walking regularly to improve overall function and ambulatory mobility Baseline: Not currently Goal status: INITIAL    PLAN:  PT FREQUENCY:1- 2x/week for total of 18 visits over 12 weeks certification.  PT DURATION: 12 weeks  PLANNED INTERVENTIONS: 97110-Therapeutic exercises, 97530- Therapeutic activity, [id number]- Neuromuscular re-education, [phone]- Self Care, 78295- Manual therapy, [phone]- Gait training, [phone]- Orthotic Fit/training, [phone]- Canalith repositioning, [id number]- Aquatic Therapy, 97014- Electrical stimulation (unattended), [phone]- Ionotophoresis 4mg /ml Dexamethasone, Patient/Family education, Balance training, Stair training, Taping, Dry Needling, Joint mobilization, Joint manipulation, Spinal manipulation, Spinal mobilization, Cryotherapy, and Moist heat   PLAN FOR NEXT SESSION: Develop home exercise program focusing on lower extremity strength follow-up with order for occupational therapy, focus on static and dynamic balance   11:49 AM, 04/27/23 Tereasa Coop, DPT Physical Therapy with [facility]

## 2023-04-28 ENCOUNTER — Ambulatory Visit: Payer: PPO | Attending: Family Medicine | Admitting: Occupational Therapy

## 2023-04-28 ENCOUNTER — Other Ambulatory Visit: Payer: Self-pay

## 2023-04-28 DIAGNOSIS — R29818 Other symptoms and signs involving the nervous system: Secondary | ICD-10-CM | POA: Insufficient documentation

## 2023-04-28 DIAGNOSIS — R278 Other lack of coordination: Secondary | ICD-10-CM | POA: Insufficient documentation

## 2023-04-28 DIAGNOSIS — R29898 Other symptoms and signs involving the musculoskeletal system: Secondary | ICD-10-CM | POA: Diagnosis present

## 2023-04-28 DIAGNOSIS — R208 Other disturbances of skin sensation: Secondary | ICD-10-CM | POA: Diagnosis present

## 2023-04-28 DIAGNOSIS — M6281 Muscle weakness (generalized): Secondary | ICD-10-CM | POA: Insufficient documentation

## 2023-04-28 DIAGNOSIS — I6300 Cerebral infarction due to thrombosis of unspecified precerebral artery: Secondary | ICD-10-CM | POA: Diagnosis present

## 2023-04-28 NOTE — Therapy (Signed)
 OUTPATIENT OCCUPATIONAL THERAPY NEURO EVALUATION  Patient Name: Timothy Brewer MRN: [id number] DOB:[birth year], 83 y.o., male Today's Date: 04/28/2023  PCP: [name], MD   REFERRING PROVIDER: [name], MD   END OF SESSION:  OT End of Session - 04/28/23 1417     Visit Number 1    Number of Visits 13   including eval   Date for OT Re-Evaluation 06/26/23    Authorization Type HTA 2025, Auth Not Reqd, VL: [place]    Progress Note Due on Visit 10    OT Start Time 0935    OT Stop Time 1011    OT Time Calculation (min) 36 min    Activity Tolerance Patient tolerated treatment well    Behavior During Therapy WFL for tasks assessed/performed             Past Medical History:  Diagnosis Date   Arthritis    Diabetes mellitus without complication (HCC)    Hyperlipidemia    Hypertension    Memory loss    Pre-diabetes    on metformin   Stroke [facility])    Past Surgical History:  Procedure Laterality Date   BACK SURGERY     c3,c4,c5,c6 ,l4,l5   EYE SURGERY     bil cataracts   NASAL SEPTUM SURGERY     TONSILLECTOMY     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   Procedure: LEFT TOTAL HIP ARTHROPLASTY ANTERIOR APPROACH;  Surgeon: Durene Romans, MD;  Location: [facility];  Service: Orthopedics;  Laterality: Left;  70 mins   Patient Active Problem List   Diagnosis Date Noted   Acute CVA (cerebrovascular accident) (HCC) 03/25/2023   Type 2 diabetes mellitus (HCC) 03/25/2023   Hyperkalemia 09/12/2020   Type 2 diabetes mellitus with hyperglycemia, without long-term current use of insulin (HCC) 09/12/2020   Hypertension associated with diabetes (HCC) 09/12/2020   GERD without esophagitis 09/12/2020   Hyperlipidemia associated with type 2 diabetes mellitus (HCC) 09/12/2020   Chronic diastolic (congestive) heart failure (HCC) 09/12/2020   TIA (transient ischemic attack) 06/03/2020   Aphasia    Right leg weakness    CVA (cerebral vascular accident) (HCC) 06/07/2017   Overweight (BMI  25.0-29.9) 02/18/2017   S/P left THA, 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 Pt reported tingling/numbness in Rt fingers and at proximal forearm, just distal to elbow, "like my hand is asleep" Pt reported R hand always feels cold since recent CVA event.  Pt reported "bursitis" in L shoulder which is sometimes painful when donning clothes.  Pt accompanied by: self  PERTINENT HISTORY: Per 04/28/23 PT Note: DB, HTN, neck surgery, back surgery, L THA   Per 04/28/23 PT Note: "Patient is status post CVA on March 25, 2023. Has had right sided weakness and sensation issues since the stroke. Reports he has not needed to use his cane or walker but does feel a little off balance. Reports he had an almost fall a couple days ago as his leg was really numb but that has since improved.  Reports he is most concerned about his balance. He was previously going to Silver sneakers at the gym and would like to get back to this. 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 Bascom Levels will refer patient to outpatient vascular surgery"  PRECAUTIONS: Fall  WEIGHT BEARING RESTRICTIONS: No  PAIN:  Are you having  pain? No  FALLS: Has patient fallen in last 6 months? No  LIVING ENVIRONMENT: Lives with: lives with their spouse Lives in: House/apartment Stairs: No Has following equipment at home: Single point cane, Environmental consultant - 2 wheeled, and Grab bars  PLOF: Independent with basic ADLs, currently driving, Hobbies: piano  PATIENT GOALS: to improve typing and "getting rid of numbness"  OBJECTIVE:  Note: Objective measures were completed at Evaluation unless otherwise noted.  HAND DOMINANCE: Right  ADLs: Overall ADLs: ind Transfers/ambulation related to ADLs: Eating: ind Grooming: ind UB Dressing: ind, no difficulty with buttons/zippers. LB Dressing: ind, no difficulty with shoelaces Toileting: ind Bathing: ind Tub Shower transfers: ind, cautious Equipment: Grab bars, Walk in shower, and Reacher  IADLs: Shopping: ind, spouse and pt complete task together Light housekeeping: ind Meal Prep: ind Community mobility: currently driving, no concerns per pt Medication management: ind, medication organizer  Handwriting:  Per pt "different, less legible" since CVA event.  Pt wrote simple sentence with approx. 90% legibility and good attention to alignment with baseline. Pt reported current handwriting at 6/10 compared to previous handwriting level.  Piano: difficulty with playing since CVA event. Typing: difficulty since CVA event.  MOBILITY STATUS: Independent without A/E  POSTURE COMMENTS:  Ind sitting balance, forward head posture  ACTIVITY TOLERANCE: Activity tolerance: Per pt, no change compared to PLOF.  FUNCTIONAL OUTCOME MEASURES: Quick Dash: 6.8% deficit    UPPER EXTREMITY ROM:    Active ROM Right eval Left eval  Shoulder flexion [facility]  Shoulder abduction    Shoulder adduction    Shoulder extension    Shoulder internal rotation    Shoulder external rotation    Elbow flexion    Elbow extension    Wrist flexion    Wrist extension    Wrist ulnar deviation    Wrist  radial deviation    Wrist pronation    Wrist supination    (Blank rows = not tested)  Composite digit flex/ext - WFL BUE  UPPER EXTREMITY MMT:     MMT Right eval Left eval  Shoulder flexion 5 5  Shoulder abduction    Shoulder adduction    Shoulder extension    Shoulder internal rotation    Shoulder external rotation    Middle trapezius    Lower trapezius    Elbow flexion    Elbow extension    Wrist flexion    Wrist extension    Wrist ulnar deviation    Wrist radial deviation    Wrist pronation    Wrist supination    (Blank rows = not tested)  HAND FUNCTION: Grip strength: Right: 56, 53, 50 (53 lbs average) lbs; Left: 55, 54, 49 (52.7 lbs average) lbs and Tip pinch: Right 18 lbs, Left: 18.5 lbs  COORDINATION: 9 Hole Peg test: Right: 20 sec; Left: 23 sec  Pt reported dropping objects more often, losing grip on objects d/t sensation changes:  moderate difficulty with dropping objects.  SENSATION: Pt reported tingling/numbness in Rt fingers and at proximal forearm, just distal to elbow. Pt reported R hand always feels cold since the CVA event.  EDEMA: Pt reported R hand "feels puffy" in morning though has not noticed any visual edema. Pt reported feeling resolves in a few minutes.  MUSCLE TONE: RUE: Within functional limits and LUE: Within functional limits  COGNITION: Overall cognitive status: Within functional limits for tasks assessed. Per pt, no changes.  VISION: Subjective report: Per pt, no changes. Pt accurately read clock on wall. Baseline vision: Wears glasses for reading only Visual history: cataracts and subsequent corrective surgery  VISION ASSESSMENT: Not tested  PERCEPTION: Not tested  PRAXIS: Not tested  OBSERVATIONS: Pt was pleasant and ambulated ind without A/E.                                                                                                                             TREATMENT DATE: 04/28/23   Self-Care OT educated pt on  OT role, POC, UE anatomy and nerve anatomy, dx, prognosis, proprioception, sensory safety for hot/cold, clinic no-show/cancel/late policy. Pt acknowledged understanding of all.    PATIENT EDUCATION: Education details: see today's tx above Person educated: Patient Education method: Explanation Education comprehension: verbalized understanding  HOME EXERCISE PROGRAM: TBD   GOALS: Goals reviewed with patient? Yes  SHORT TERM GOALS: Target date: 05/29/23  Pt will be ind with RUE HEP using visual handouts. Baseline: new to outpt OT Goal status: INITIAL  2.  Pt will recall at least 3 sensory safety precautions for affected UE. Baseline: Pt reported tingling/numbness in Rt fingers and at proximal forearm, just distal to elbow. Pt reported R hand always has felt cold. Goal status: INITIAL  3.  Pt will report no more than mild difficulty with dropping objects during daily functional tasks. Baseline: Pt reported dropping objects more often, losing grip on objects d/t sensation changes: moderate difficulty with dropping objects. Goal status: INITIAL  LONG TERM GOALS: Target date: 06/26/23  Pt will report improved efficiency with typing and piano tasks, indicating improved FM coordination.  Baseline: Piano: difficulty with playing since CVA event. Typing: difficulty since CVA event. Goal status: INITIAL  2.  Pt will report handwriting improved to 8 out of 10 compared to PLOF using A/E and adaptive strategies PRN. Baseline: Per pt, handwriting is "different, less legible" since CVA event. Pt wrote simple sentence with approx. 90% legibility and good attention to alignment with baseline. Pt reported current handwriting at 6/10 level compared to previous handwriting level. Goal status: INITIAL  3.  Pt will report no difficulty when completing work or other regular daily activities per QuickDASH. Baseline: Per QuickDASH, pt reported "mild" limitation in work or other regular daily  activities. Goal status: INITIAL   ASSESSMENT:  CLINICAL IMPRESSION: Patient is a 83 y.o. male who was seen today for occupational therapy evaluation for Hemiplegia and hemiparesis following cerebral infarction affecting  right dominant side. Hx includes DB, HTN, neck surgery, back surgery, L THA . Patient currently presents below baseline level of functioning demonstrating functional deficits and impairments as noted below. Pt would benefit from skilled OT services in the outpatient setting to work on impairments as noted below to help pt return to PLOF as able.     PERFORMANCE DEFICITS: in functional skills including ADLs, IADLs, coordination, dexterity, proprioception, sensation, flexibility, Fine motor control, Gross motor control, balance, body mechanics, endurance, and UE functional use, cognitive skills including  none , and psychosocial skills including environmental adaptation.   IMPAIRMENTS: are limiting patient from ADLs, IADLs, leisure, and social participation.   CO-MORBIDITIES: may have co-morbidities  that affects occupational performance. Patient will benefit from skilled OT to address above impairments and improve overall function.  MODIFICATION OR ASSISTANCE TO COMPLETE EVALUATION: No modification of tasks or assist necessary to complete an evaluation.  OT OCCUPATIONAL PROFILE AND HISTORY: Problem focused assessment: Including review of records relating to presenting problem.  CLINICAL DECISION MAKING: LOW - limited treatment options, no task modification necessary  REHAB POTENTIAL: Good  EVALUATION COMPLEXITY: Low    PLAN:  OT FREQUENCY: 2x/week  OT DURATION: 6 weeks (dates extended to allow for scheduling)  PLANNED INTERVENTIONS: 97168 OT Re-evaluation, 97535 self care/ADL training, 40981 therapeutic exercise, 97530 therapeutic activity, 97112 neuromuscular re-education, 97140 manual therapy, 97035 ultrasound, 97018 paraffin, 19147 fluidotherapy, 97010 moist heat,  97010 cryotherapy, 97034 contrast bath, passive range of motion, functional mobility training, energy conservation, patient/family education, and DME and/or AE instructions  RECOMMENDED OTHER SERVICES: PT eval already completed  CONSULTED AND AGREED WITH PLAN OF CARE: Patient  PLAN FOR NEXT SESSION:   Sensory safety and proprioception HEP/handout Sensory bin activities Tendon glides HEP FM coordination HEP Typing practice - provide websites for typing practice (HEP) FM coordination activities   Wynetta Emery, OT 04/28/2023, 2:28 PM

## 2023-04-29 ENCOUNTER — Encounter: Payer: PPO | Admitting: Physical Therapy

## 2023-04-30 NOTE — Therapy (Unsigned)
 OUTPATIENT OCCUPATIONAL THERAPY NEURO TREATMENT  Patient Name: Timothy Brewer MRN: [id number] DOB:Feb 01, 1941, 83 y.o., male Today's Date: 05/01/2023  PCP: [name], MD   REFERRING PROVIDER: [name], MD   END OF SESSION:  OT End of Session - 05/01/23 1150     Visit Number 2    Number of Visits 13   including eval   Date for OT Re-Evaluation 06/26/23    Authorization Type HTA 2025, Auth Not Reqd, VL: MN    Progress Note Due on Visit 10    OT Start Time 1150    OT Stop Time 1230    OT Time Calculation (min) 40 min    Activity Tolerance Patient tolerated treatment well    Behavior During Therapy WFL for tasks assessed/performed             Past Medical History:  Diagnosis Date   Arthritis    Diabetes mellitus without complication (HCC)    Hyperlipidemia    Hypertension    Memory loss    Pre-diabetes    on metformin   Stroke [facility])    Past Surgical History:  Procedure Laterality Date   BACK SURGERY     c3,c4,c5,c6 ,l4,l5   EYE SURGERY     bil cataracts   NASAL SEPTUM SURGERY     TONSILLECTOMY     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   Procedure: LEFT TOTAL HIP ARTHROPLASTY ANTERIOR APPROACH;  Surgeon: Durene Romans, MD;  Location: [facility];  Service: Orthopedics;  Laterality: Left;  70 mins   Patient Active Problem List   Diagnosis Date Noted   Acute CVA (cerebrovascular accident) (HCC) 03/25/2023   Type 2 diabetes mellitus (HCC) 03/25/2023   Hyperkalemia 09/12/2020   Type 2 diabetes mellitus with hyperglycemia, without long-term current use of insulin (HCC) 09/12/2020   Hypertension associated with diabetes (HCC) 09/12/2020   GERD without esophagitis 09/12/2020   Hyperlipidemia associated with type 2 diabetes mellitus (HCC) 09/12/2020   Chronic diastolic (congestive) heart failure (HCC) 09/12/2020   TIA (transient ischemic attack) 06/03/2020   Aphasia    Right leg weakness    CVA (cerebral vascular accident) (HCC) 06/07/2017   Overweight (BMI  25.0-29.9) 02/18/2017   S/P left THA, 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   Plantar fasciitis 06/09/2016    ONSET DATE: 04/16/23 (referral date)  REFERRING DIAG: I69.351 (ICD-10-CM) - Hemiplegia and hemiparesis following cerebral infarction affecting right dominant side   THERAPY DIAG:  Other lack of coordination  Other symptoms and signs involving the musculoskeletal system  Other symptoms and signs involving the nervous system  Other disturbances of skin sensation  Muscle weakness (generalized)  Cerebrovascular accident (CVA) due to thrombosis of precerebral artery (HCC)  Rationale for Evaluation and Treatment: Rehabilitation  SUBJECTIVE:   SUBJECTIVE STATEMENT: Pt reports sensation in RUE is improving from proximal to distal.   Pt accompanied by: self  PERTINENT HISTORY: Per 04/28/23 PT Note: DB, HTN, neck surgery, back surgery, L THA   Per 04/28/23 PT Note: "Patient is status post CVA on March 25, 2023. Has had right sided weakness and sensation issues since the stroke. Reports he has not needed to use his cane or walker but does feel a little off balance. Reports he had an almost fall a couple days ago as his leg was really numb but that has since improved. Reports he is most concerned about his balance. He was previously going to Silver sneakers at the gym and would like to get back to this. 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 Bascom Levels will refer patient to outpatient vascular surgery"  PRECAUTIONS: Fall  WEIGHT BEARING RESTRICTIONS: No  PAIN:  Are you having pain? No  FALLS: Has patient fallen in last 6 months? No  LIVING ENVIRONMENT: Lives with: lives with their spouse Lives in: House/apartment Stairs:  No Has following equipment at home: Single point cane, Environmental consultant - 2 wheeled, and Grab bars  PLOF: Independent with basic ADLs, currently driving, Hobbies: piano  PATIENT GOALS: to improve typing and "getting rid of numbness"  OBJECTIVE:  Note: Objective measures were completed at Evaluation unless otherwise noted.  HAND DOMINANCE: Right  ADLs: Overall ADLs: ind Transfers/ambulation related to ADLs: Eating: ind Grooming: ind UB Dressing: ind, no difficulty with buttons/zippers. LB Dressing: ind, no difficulty with shoelaces Toileting: ind Bathing: ind Tub Shower transfers: ind, cautious Equipment: Grab bars, Walk in shower, and Reacher  IADLs: Shopping: ind, spouse and pt complete task together Light housekeeping: ind Meal Prep: ind Community mobility: currently driving, no concerns per pt Medication management: ind, medication organizer  Handwriting:  Per pt "different, less legible" since CVA event.  Pt wrote simple sentence with approx. 90% legibility and good attention to alignment with baseline. Pt reported current handwriting at 6/10 compared to previous handwriting level.  Piano: difficulty with playing since CVA event. Typing: difficulty since CVA event.  MOBILITY STATUS: Independent without A/E  POSTURE COMMENTS:  Ind sitting balance, forward head posture  ACTIVITY TOLERANCE: Activity tolerance: Per pt, no change compared to PLOF.  FUNCTIONAL OUTCOME MEASURES: Quick Dash: 6.8% deficit    UPPER EXTREMITY ROM:    Active ROM Right eval Left eval  Shoulder flexion [facility]  Shoulder abduction    Shoulder adduction    Shoulder extension    Shoulder internal rotation    Shoulder external rotation    Elbow flexion    Elbow extension    Wrist flexion    Wrist extension    Wrist ulnar deviation    Wrist radial deviation    Wrist pronation    Wrist supination    (Blank rows = not tested)  Composite digit flex/ext - WFL BUE  UPPER EXTREMITY MMT:      MMT Right eval Left eval  Shoulder flexion 5 5  Shoulder abduction    Shoulder adduction    Shoulder extension    Shoulder internal rotation    Shoulder external rotation    Middle trapezius    Lower trapezius    Elbow flexion    Elbow extension    Wrist flexion    Wrist extension    Wrist ulnar deviation    Wrist radial deviation    Wrist pronation    Wrist supination    (Blank rows = not tested)  HAND FUNCTION: Grip strength: Right: 56, 53, 50 (53 lbs average) lbs; Left: 55, 54, 49 (52.7 lbs average) lbs and Tip pinch: Right 18 lbs, Left: 18.5 lbs  COORDINATION: 9 Hole Peg test: Right: 20 sec; Left: 23 sec  Pt reported dropping objects more often, losing grip on objects d/t sensation changes: moderate difficulty with dropping objects.  SENSATION: Pt reported tingling/numbness in Rt fingers and at proximal forearm, just distal to elbow. Pt reported R hand always feels cold since  the CVA event.  EDEMA: Pt reported R hand "feels puffy" in morning though has not noticed any visual edema. Pt reported feeling resolves in a few minutes.  MUSCLE TONE: RUE: Within functional limits and LUE: Within functional limits  COGNITION: Overall cognitive status: Within functional limits for tasks assessed. Per pt, no changes.  VISION: Subjective report: Per pt, no changes. Pt accurately read clock on wall. Baseline vision: Wears glasses for reading only Visual history: cataracts and subsequent corrective surgery  VISION ASSESSMENT: Not tested  PERCEPTION: Not tested  PRAXIS: Not tested  OBSERVATIONS: Pt was pleasant and ambulated ind without A/E.                                                                                                                             TREATMENT :    OT educated patient in Safety considerations for loss of sensation as noted in patient instructions to reduce risk of injury to affected hand.  Pt encouraged to be careful of sharp, hot/cold  (check temperatures of water), breakable, heavy objects and chemicals. Patient verbalized understanding. Handouts provided.    OT educated pt on [email or web address].es, playing piano, table or wall push-ups, and use of rice/sensory bins. Pt able to locate and identify [employer] hotel piece with L hand. Pt instructed to compare item in L hand as well for additional feedback.    PATIENT EDUCATION: Education details: see today's tx above Person educated: Patient Education method: Explanation Education comprehension: verbalized understanding  HOME EXERCISE PROGRAM: TBD   GOALS: Goals reviewed with patient? Yes  SHORT TERM GOALS: Target date: 05/29/23  Pt will be ind with RUE HEP using visual handouts. Baseline: new to outpt OT Goal status: INITIAL  2.  Pt will recall at least 3 sensory safety precautions for affected UE. Baseline: Pt reported tingling/numbness in Rt fingers and at proximal forearm, just distal to elbow. Pt reported R hand always has felt cold. Goal status: IN PROGRESS  3.  Pt will report no more than mild difficulty with dropping objects during daily functional tasks. Baseline: Pt reported dropping objects more often, losing grip on objects d/t sensation changes: moderate difficulty with dropping objects. Goal status: INITIAL  LONG TERM GOALS: Target date: 06/26/23  Pt will report improved efficiency with typing and piano tasks, indicating improved FM coordination.  Baseline: Piano: difficulty with playing since CVA event. Typing: difficulty since CVA event. Goal status: INITIAL  2.  Pt will report handwriting improved to 8 out of 10 compared to PLOF using A/E and adaptive strategies PRN. Baseline: Per pt, handwriting is "different, less legible" since CVA event. Pt wrote simple sentence with approx. 90% legibility and good attention to alignment with baseline. Pt reported current handwriting at 6/10 level compared to previous handwriting level. Goal status: INITIAL  3.  Pt  will report no difficulty when completing work or other regular daily activities per QuickDASH. Baseline: Per QuickDASH, pt reported "mild" limitation in work or other  regular daily activities. Goal status: INITIAL   ASSESSMENT:  CLINICAL IMPRESSION: Patient demonstrates good understanding of strategies as discussed today as needed to progress towards goals. Will continue to progress as needed.   PERFORMANCE DEFICITS: in functional skills including ADLs, IADLs, coordination, dexterity, proprioception, sensation, flexibility, Fine motor control, Gross motor control, balance, body mechanics, endurance, and UE functional use, cognitive skills including  none , and psychosocial skills including environmental adaptation.   IMPAIRMENTS: are limiting patient from ADLs, IADLs, leisure, and social participation.   CO-MORBIDITIES: may have co-morbidities  that affects occupational performance. Patient will benefit from skilled OT to address above impairments and improve overall function.  REHAB POTENTIAL: Good  PLAN:  OT FREQUENCY: 2x/week  OT DURATION: 6 weeks (dates extended to allow for scheduling)  PLANNED INTERVENTIONS: 97168 OT Re-evaluation, 97535 self care/ADL training, 16109 therapeutic exercise, 97530 therapeutic activity, 97112 neuromuscular re-education, 97140 manual therapy, 97035 ultrasound, 97018 paraffin, 60454 fluidotherapy, 97010 moist heat, 97010 cryotherapy, 97034 contrast bath, passive range of motion, functional mobility training, energy conservation, patient/family education, and DME and/or AE instructions  RECOMMENDED OTHER SERVICES: PT eval already completed  CONSULTED AND AGREED WITH PLAN OF CARE: Patient  PLAN FOR NEXT SESSION:   Review - Sensory safety and proprioception HEP/handout and Sensory bin activities Typing practice - provide websites for typing practice (HEP)  Tendon glides HEP FM coordination HEP FM coordination activities   Delana Meyer,  OT 05/01/2023, 12:58 PM

## 2023-05-01 ENCOUNTER — Ambulatory Visit: Payer: PPO | Admitting: Occupational Therapy

## 2023-05-01 DIAGNOSIS — R29818 Other symptoms and signs involving the nervous system: Secondary | ICD-10-CM

## 2023-05-01 DIAGNOSIS — R29898 Other symptoms and signs involving the musculoskeletal system: Secondary | ICD-10-CM

## 2023-05-01 DIAGNOSIS — I6300 Cerebral infarction due to thrombosis of unspecified precerebral artery: Secondary | ICD-10-CM

## 2023-05-01 DIAGNOSIS — R278 Other lack of coordination: Secondary | ICD-10-CM | POA: Diagnosis not present

## 2023-05-01 DIAGNOSIS — R208 Other disturbances of skin sensation: Secondary | ICD-10-CM

## 2023-05-01 DIAGNOSIS — M6281 Muscle weakness (generalized): Secondary | ICD-10-CM

## 2023-05-01 NOTE — Patient Instructions (Addendum)
  Re/desensitization Techniques Some ways to Re/desensitize a hyper or hyposensitive ie) numb, tingling limb/area is by rubbing it with different textures. This will make your limb more tolerant/aware of touch and pressure. Before you begin, make sure your hands and the materials you're using are clean.  To rub your painful/sensitive area with different textures: Sit in a comfortable position with the numb/painful/sensitive area uncovered. Start with a material that is soft, such as a cotton ball, silk or soft towel. Rub your painful/sensitive area in all directions. Start with a light pressure and gradually increase the pressure. Vary the textures you use, as you can tolerate them. Start with soft materials like cotton balls or a makeup brush. Progress to materials that are rougher. Examples include a paper towel, cloth towel, wool, or velcro. As you progress, gradually increase the pressure and roughness of the texture you use. Be careful not to rub over any incisions or wounds, if you still have staples or sutures in place, or if there are any open areas. Rub your limb for at least 30 seconds progressing up to a minute or two as often as you can tolerate, or as recommended by your healthcare provider. Be careful not to irritate your skin or rub your skin raw.  Stop rubbing your affected area if you notice redness that does not dissipate in 15-30 minutes, bleeding or opening skin, and contact your healthcare provider.  Other methods for re/desensitization include: Allow cool or warm water to run over the area.  Be careful not to get the water too hot, especially if you have decreases sensation of temperature. Putting your affected area into a bowl of dry rice, sand, kidney beans, cold water or warm water. You can hide objects in the dry materials to find. Make a bag of miscellaneous matching objects to feel and match based on feel ie) paper clips, nuts, bolts, dice, marbles, checkers, dominos, beads  etc.  If you choose to start with the method of dipping your affected area into a medium such as rice, sand, or water make sure to move the affected area around in the bowl until you cannot tolerate it or you reach one to two minutes, whichever comes first. You can use a combination of these methods for 10 to 15 minutes, 3-4 times per day.   Safety considerations for loss of sensation:   Look at affected hand when using it!   Do NOT use affected arm for anything: sharp, hot, breakable, or too heavy  Always check temperature of water (for showering, washing dishes, etc) with UNaffected arm/extremity  Consider travel mugs w/ lids to transport hot liquids/coffee  Consider alternative options and/or adaptive equipment to make things safer (ex: hand chopper or cut resistant glove for chopping vegetables)   Avoid cold temperatures as well (wear glove in cold temperatures, get ice w/ unaffected extremity)  AVOID handling chemicals and machinery    * play piano *[email or web address]  RIGHT HANDED TYPING WORDS pink oink pill hike hip omen play plan king noon plug jug mom pop mop him moon lime poultry fiction lint bundle typing nominal your boil coil soil numb pick lick humble jump hump bunk funk plump dump out tuition motion notion potion lotion jungle buy book hook nun none

## 2023-05-04 ENCOUNTER — Ambulatory Visit: Payer: PPO | Admitting: Physical Therapy

## 2023-05-04 ENCOUNTER — Encounter: Payer: Self-pay | Admitting: Physical Therapy

## 2023-05-04 DIAGNOSIS — I6300 Cerebral infarction due to thrombosis of unspecified precerebral artery: Secondary | ICD-10-CM

## 2023-05-04 DIAGNOSIS — R262 Difficulty in walking, not elsewhere classified: Secondary | ICD-10-CM | POA: Diagnosis not present

## 2023-05-04 DIAGNOSIS — M6281 Muscle weakness (generalized): Secondary | ICD-10-CM | POA: Diagnosis not present

## 2023-05-04 NOTE — Therapy (Signed)
 OUTPATIENT PHYSICAL THERAPY LOWER EXTREMITY TREATMENT and Discharge   PHYSICAL THERAPY DISCHARGE SUMMARY  Visits from Start of Care: 5  Current functional level related to goals / functional outcomes: See below   Remaining deficits: See below   Education / Equipment: See below   Patient agrees to discharge. Patient goals were partially met. Patient is being discharged due to being pleased with the current functional level.  Patient Name: Timothy Brewer MRN: [id number] DOB:1940/05/12, 83 y.o., male Today's Date: 05/04/2023  END OF SESSION:  PT End of Session - 05/04/23 0933     Visit Number 5    Number of Visits 18    Date for PT Re-Evaluation 07/07/23    Authorization Type HTA    PT Start Time 0933    PT Stop Time 1011    PT Time Calculation (min) 38 min    Equipment Utilized During Treatment Gait belt    Activity Tolerance Patient tolerated treatment well    Behavior During Therapy WFL for tasks assessed/performed              Past Medical History:  Diagnosis Date   Arthritis    Diabetes mellitus without complication (HCC)    Hyperlipidemia    Hypertension    Memory loss    Pre-diabetes    on metformin   Stroke [facility])    Past Surgical History:  Procedure Laterality Date   BACK SURGERY     c3,c4,c5,c6 ,l4,l5   EYE SURGERY     bil cataracts   NASAL SEPTUM SURGERY     TONSILLECTOMY     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   Procedure: LEFT TOTAL HIP ARTHROPLASTY ANTERIOR APPROACH;  Surgeon: Durene Romans, MD;  Location: [facility];  Service: Orthopedics;  Laterality: Left;  70 mins   Patient Active Problem List   Diagnosis Date Noted   Acute CVA (cerebrovascular accident) (HCC) 03/25/2023   Type 2 diabetes mellitus (HCC) 03/25/2023   Hyperkalemia 09/12/2020   Type 2 diabetes mellitus with hyperglycemia, without long-term current use of insulin (HCC) 09/12/2020   Hypertension associated with diabetes (HCC) 09/12/2020   GERD without esophagitis  09/12/2020   Hyperlipidemia associated with type 2 diabetes mellitus (HCC) 09/12/2020   Chronic diastolic (congestive) heart failure (HCC) 09/12/2020   TIA (transient ischemic attack) 06/03/2020   Aphasia    Right leg weakness    CVA (cerebral vascular accident) (HCC) 06/07/2017   Overweight (BMI 25.0-29.9) 02/18/2017   S/P left THA, 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 States he feels 84% better since start of PT and does exercises every other day.  Eval: Patient is status post CVA on March 25, 2023.  Has had right sided weakness and sensation issues since the stroke.  Reports he has not needed to use his cane or walker but does feel a little off balance.  Reports he had an almost fall a couple days ago as his leg was really numb but that has since improved.  Reports he is  most concerned about his balance.  He was previously going to Silver sneakers at the gym and would like to get back to this.  Otherwise patient is not very active at home in regards to working out.    PERTINENT HISTORY: DB, HTN, neck surgery, back surgery, L THA PAIN:  no  PRECAUTIONS: None  RED FLAGS: None   WEIGHT BEARING RESTRICTIONS: No  FALLS:  Has patient fallen in last 6 months? No had one almost fall 2 days ago with right numbness in leg  LIVING ENVIRONMENT: Lives with: lives with their family Lives in: House/apartment Stairs: No Has following equipment at home:  Single point cane, Environmental consultant - 2 wheeled, and Grab bars  OCCUPATION: retired  PLOF: Independent, like to cook and read.  PATIENT GOALS: Will improve balance and strength  NEXT MD VISIT: 06/01/23  OBJECTIVE:  Note: Objective measures were completed at Evaluation unless otherwise noted.    COGNITION: Overall cognitive status: Within functional limits for tasks assessed     SENSATION: Not tested  EDEMA:  None noted in lower extremities        LE Measurements Lower Extremity Right 2/24 Left 2/24   A/PROM MMT A/PROM MMT  Hip Flexion  4-  4  Hip Extension      Hip Abduction      Hip Adduction      Hip Internal rotation      Hip External rotation      Knee Flexion  4  4+  Knee Extension  4+  5  Ankle Dorsiflexion  4  4+  Ankle Plantarflexion      Ankle Inversion      Ankle Eversion       (Blank rows = not tested) * pain   FUNCTIONAL TESTS:  Eval: 5x STS- 20.48 second no UE support --> 04/2423 13.65 seconds SLS R 5 seconds mod sway, L SLS unable; 2/24 R 10 seconds mod sway, L 5 seconds moderate sway  Eval: Left Tandem 16 second mod sway, tandem right posterior 5 seconds with mod sway; 2/24 30 second moderate sway no UE support  GAIT: Distance walked: 50 ft  Assistive device utilized: None Level of assistance: Modified independence Comments: wide  BOS - slight deviation from intended path   [facility] PT Assessment - 05/04/23 0001       Dynamic Gait Index   Level Surface Normal    Change in Gait Speed Normal    Gait with Horizontal Head Turns Normal    Gait with Vertical Head Turns Normal    Gait and Pivot Turn Normal    Step Over Obstacle Normal    Step Around Obstacles Normal    Steps Normal    Total Score 24  TREATMENT DATE:   05/04/2023 Therapeutic Exercise: OBJECTIVE MEASURES UPDATED Reviewed  HEP  Supine: Prone:  Seated:     Standing: Neuromuscular Re-education:OBJECTIVE MEASURES UPDATED  Sls one finger support x3 30" holds B, foot taps on step 6" 1 minute  Manual Therapy: Therapeutic Activity: Self Care: Trigger Point Dry Needling:  Modalities:    PATIENT EDUCATION:  Education details: on HEP, on importance of walking program and continuing HEP, about benefits of speech therapy.  Person educated: Patient Education method: Explanation, Demonstration, and Handouts Education comprehension: verbalized understanding   HOME EXERCISE PROGRAM: [id number]  ASSESSMENT:  CLINICAL IMPRESSION: 05/04/2023 Overall patient had done well while in therapy he has met all but one goal. He is no longer measuring at a fall risk with the DGI or the 5x STS. Answered all questions and reviewed HEP. Patient to DC from PT to HEP secondary to progress made while in PT.   EVAL: Patient presents to physical therapy with complaints of right lower extremity weakness, gait issues and balance issues after a CVA on March 25, 2023.  Patient also presents with right upper extremity numbness and fine motor loss, encouraged patient to follow-up with primary care provider in regards to Occupational Therapy order.  Patient presents with dynamic and static balance deficits along with right lower extremity weakness.  Educated patient on current presentation as well as plan of care moving forward.  Patient would greatly benefit from skilled PT to improve overall functional strength and reduce fall risk at home and in the community.  OBJECTIVE IMPAIRMENTS: Abnormal gait, decreased activity tolerance, decreased mobility, difficulty walking, decreased ROM, decreased strength, decreased safety awareness, and improper body mechanics.   ACTIVITY LIMITATIONS: standing, squatting, stairs, transfers, and locomotion level  PARTICIPATION LIMITATIONS: meal prep, cleaning, community activity, and church  PERSONAL  FACTORS: 1 comorbidity: DB  are also affecting patient's functional outcome.   REHAB POTENTIAL: Good  CLINICAL DECISION MAKING: Stable/uncomplicated  EVALUATION COMPLEXITY: Low   GOALS: Goals reviewed with patient? yes  SHORT TERM GOALS: Target date: 05/26/2023   Patient will be independent in self management strategies to improve quality of life and functional outcomes. Baseline: New Program Goal status: MET  2.  Patient will report at least 50% improvement in overall symptoms and/or function to demonstrate improved functional mobility Baseline: 0% better Goal status: MET  3.  Patient will be able to demonstrate 5 times sit to stand without the use of upper extremity support in less than 16 seconds to demonstrate reduced fall risk Baseline: See above Goal status: MET    LONG TERM GOALS: Target date: 07/07/2023    Patient will report at least 75% improvement in overall symptoms and/or function to demonstrate improved functional mobility Baseline: 0% better Goal status: MET  2.  Patient will be able to demonstrate tandem with either leg in back and no upper extremity support for at least 30 seconds to demonstrate improved static balance Baseline: see above Goal status: MET  3.  Patient will score with at least 20 out of 24 on DGI to demonstrate improved dynamic balance Baseline: See above Goal status: MET  4.  Patient will report walking regularly to improve overall function and ambulatory mobility Baseline: Not currently Goal status: PROGRESSING    PLAN:  PT FREQUENCY:1- 2x/week for total of 18 visits over 12 weeks certification.  PT DURATION: 12 weeks  PLANNED INTERVENTIONS: 97110-Therapeutic exercises, 97530- Therapeutic activity, [id number]- Neuromuscular re-education, 97535- Self Care, 16109- Manual therapy, [id number]- Gait training, [phone]- Orthotic Fit/training, [id number]-  Canalith repositioning, 16109- Aquatic Therapy, [id number]- Electrical stimulation (unattended), [id number]-  Ionotophoresis 4mg /ml Dexamethasone, Patient/Family education, Balance training, Stair training, Taping, Dry Needling, Joint mobilization, Joint manipulation, Spinal manipulation, Spinal mobilization, Cryotherapy, and Moist heat   PLAN FOR NEXT SESSION: DC to HEP   10:13 AM, 05/04/23 Tereasa Coop, DPT Physical Therapy with [facility]

## 2023-05-05 ENCOUNTER — Encounter: Payer: PPO | Admitting: Occupational Therapy

## 2023-05-05 ENCOUNTER — Ambulatory Visit: Payer: PPO | Attending: Family Medicine | Admitting: Occupational Therapy

## 2023-05-05 DIAGNOSIS — R208 Other disturbances of skin sensation: Secondary | ICD-10-CM | POA: Insufficient documentation

## 2023-05-05 DIAGNOSIS — R29898 Other symptoms and signs involving the musculoskeletal system: Secondary | ICD-10-CM | POA: Insufficient documentation

## 2023-05-05 DIAGNOSIS — R278 Other lack of coordination: Secondary | ICD-10-CM | POA: Insufficient documentation

## 2023-05-05 DIAGNOSIS — M6281 Muscle weakness (generalized): Secondary | ICD-10-CM | POA: Insufficient documentation

## 2023-05-06 ENCOUNTER — Encounter: Payer: PPO | Admitting: Physical Therapy

## 2023-05-06 ENCOUNTER — Ambulatory Visit: Payer: PPO | Admitting: Occupational Therapy

## 2023-05-06 DIAGNOSIS — R208 Other disturbances of skin sensation: Secondary | ICD-10-CM | POA: Diagnosis present

## 2023-05-06 DIAGNOSIS — M6281 Muscle weakness (generalized): Secondary | ICD-10-CM | POA: Diagnosis present

## 2023-05-06 DIAGNOSIS — R278 Other lack of coordination: Secondary | ICD-10-CM | POA: Diagnosis present

## 2023-05-06 DIAGNOSIS — R29898 Other symptoms and signs involving the musculoskeletal system: Secondary | ICD-10-CM | POA: Diagnosis present

## 2023-05-06 NOTE — Therapy (Signed)
 OUTPATIENT OCCUPATIONAL THERAPY NEURO TREATMENT  Patient Name: Timothy Brewer MRN: [id number] DOB:November 21, 1940, 83 y.o., male Today's Date: 05/06/2023  PCP: [name], MD   REFERRING PROVIDER: [name], MD   END OF SESSION:  OT End of Session - 05/06/23 4098     Visit Number 3    Number of Visits 13   including eval   Date for OT Re-Evaluation 06/26/23    Authorization Type HTA 2025, Auth Not Reqd, VL: [place]    Progress Note Due on Visit 10    OT Start Time 0933    OT Stop Time 1014    OT Time Calculation (min) 41 min    Activity Tolerance Patient tolerated treatment well    Behavior During Therapy WFL for tasks assessed/performed              Past Medical History:  Diagnosis Date   Arthritis    Diabetes mellitus without complication (HCC)    Hyperlipidemia    Hypertension    Memory loss    Pre-diabetes    on metformin   Stroke [facility])    Past Surgical History:  Procedure Laterality Date   BACK SURGERY     c3,c4,c5,c6 ,l4,l5   EYE SURGERY     bil cataracts   NASAL SEPTUM SURGERY     TONSILLECTOMY     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   Procedure: LEFT TOTAL HIP ARTHROPLASTY ANTERIOR APPROACH;  Surgeon: Durene Romans, MD;  Location: [facility];  Service: Orthopedics;  Laterality: Left;  70 mins   Patient Active Problem List   Diagnosis Date Noted   Acute CVA (cerebrovascular accident) (HCC) 03/25/2023   Type 2 diabetes mellitus (HCC) 03/25/2023   Hyperkalemia 09/12/2020   Type 2 diabetes mellitus with hyperglycemia, without long-term current use of insulin (HCC) 09/12/2020   Hypertension associated with diabetes (HCC) 09/12/2020   GERD without esophagitis 09/12/2020   Hyperlipidemia associated with type 2 diabetes mellitus (HCC) 09/12/2020   Chronic diastolic (congestive) heart failure (HCC) 09/12/2020   TIA (transient ischemic attack) 06/03/2020   Aphasia    Right leg weakness    CVA (cerebral vascular accident) (HCC) 06/07/2017   Overweight  (BMI 25.0-29.9) 02/18/2017   S/P left THA, 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  Pt reports foot has improved but R hand continues to feel "fuzzy and tingly".  Pt reports still having difficulty telling if he has something in his hand.    Pt accompanied by: self  PERTINENT HISTORY: Per 04/28/23 PT Note: DB, HTN, neck surgery, back surgery, L THA   Per 04/28/23 PT Note: "Patient is status post CVA on March 25, 2023. Has had right sided weakness and sensation issues since the stroke. Reports he has not needed to use his cane or walker but does feel a little off balance. Reports he had an almost fall a couple days ago as his leg was really numb but that has since improved. Reports he is most concerned about his balance. He was previously going to  Silver sneakers at the gym and would like to get back to this. 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 Bascom Levels will refer patient to outpatient vascular surgery"  PRECAUTIONS: Fall  WEIGHT BEARING RESTRICTIONS: No  PAIN:  Are you having pain? No  FALLS: Has patient fallen in last 6 months? No  LIVING ENVIRONMENT: Lives with: lives with their  spouse Lives in: House/apartment Stairs: No Has following equipment at home: Single point cane, Environmental consultant - 2 wheeled, and Grab bars  PLOF: Independent with basic ADLs, currently driving, Hobbies: piano  PATIENT GOALS: to improve typing and "getting rid of numbness"  OBJECTIVE:  Note: Objective measures were completed at Evaluation unless otherwise noted.  HAND DOMINANCE: Right  ADLs: Overall ADLs: ind Transfers/ambulation related to ADLs: Eating: ind Grooming: ind UB Dressing: ind, no difficulty with buttons/zippers. LB Dressing: ind, no difficulty with shoelaces Toileting: ind Bathing: ind Tub Shower transfers: ind, cautious Equipment: Grab bars, Walk in shower, and Reacher  IADLs: Shopping: ind, spouse and pt complete task together Light housekeeping: ind Meal Prep: ind Community mobility: currently driving, no concerns per pt Medication management: ind, medication organizer  Handwriting:  Per pt "different, less legible" since CVA event.  Pt wrote simple sentence with approx. 90% legibility and good attention to alignment with baseline. Pt reported current handwriting at 6/10 compared to previous handwriting level.  Piano: difficulty with playing since CVA event. Typing: difficulty since CVA event.  MOBILITY STATUS: Independent without A/E  POSTURE COMMENTS:  Ind sitting balance, forward head posture  ACTIVITY TOLERANCE: Activity tolerance: Per pt, no change compared to PLOF.  FUNCTIONAL OUTCOME MEASURES: Quick Dash: 6.8% deficit    UPPER EXTREMITY ROM:    Active ROM Right eval Left eval  Shoulder flexion [facility]  Shoulder abduction    Shoulder adduction    Shoulder extension    Shoulder internal rotation    Shoulder external rotation    Elbow flexion    Elbow extension    Wrist flexion    Wrist extension    Wrist ulnar deviation    Wrist radial deviation    Wrist pronation    Wrist supination    (Blank rows = not tested)  Composite digit  flex/ext - WFL BUE  UPPER EXTREMITY MMT:     MMT Right eval Left eval  Shoulder flexion 5 5  Shoulder abduction    Shoulder adduction    Shoulder extension    Shoulder internal rotation    Shoulder external rotation    Middle trapezius    Lower trapezius    Elbow flexion    Elbow extension    Wrist flexion    Wrist extension    Wrist ulnar deviation    Wrist radial deviation    Wrist pronation    Wrist supination    (Blank rows = not tested)  HAND FUNCTION: Grip strength: Right: 56, 53, 50 (53 lbs average) lbs; Left: 55, 54, 49 (52.7 lbs average) lbs and Tip pinch: Right 18 lbs, Left: 18.5 lbs  COORDINATION: 9 Hole Peg test: Right: 20 sec; Left: 23 sec  Pt reported dropping objects more often, losing grip on objects d/t sensation changes: moderate difficulty with dropping objects.  SENSATION: Pt reported tingling/numbness in Rt fingers  and at proximal forearm, just distal to elbow. Pt reported R hand always feels cold since the CVA event.  EDEMA: Pt reported R hand "feels puffy" in morning though has not noticed any visual edema. Pt reported feeling resolves in a few minutes.  MUSCLE TONE: RUE: Within functional limits and LUE: Within functional limits  COGNITION: Overall cognitive status: Within functional limits for tasks assessed. Per pt, no changes.  VISION: Subjective report: Per pt, no changes. Pt accurately read clock on wall. Baseline vision: Wears glasses for reading only Visual history: cataracts and subsequent corrective surgery  VISION ASSESSMENT: Not tested  PERCEPTION: Not tested  PRAXIS: Not tested  OBSERVATIONS: Pt was pleasant and ambulated ind without A/E.                                                                                                                             TREATMENT :   05/06/23 Sensation: engaged in retrieving various items from sensory bin with R hand.  Pt able to locate lego pieces, dice, and coins with increased  time.  Pt with increased difficulty locating coins due to flat/smooth in nature.  Desensitization:  reiterated education from previous session in regards to various textures and challenging sensation with and without vision while utilizing vision for safety with heavy, hot/cold, sharp, etc items.   Coordination: w/ LUE including: picking up various small objects/coins and placing into container to challenge sensation and grasp on various sized items,  picking up coins and stacking them, and picking up 5-10 coins 1 at a time and translating palm to fingertips to place into coin slot.  Pt dropping coins x1 with translation from palm to finger tips.  Engaged in pen tricks with Pen rotation, flip, and shift with pen. OT providing demonstration and cues for increased carryover.  Pt then rotating 2 golf balls in palm of R hand with improved each going clockwise over counter clockwise.    PATIENT EDUCATION: Education details: see today's tx above Person educated: Patient Education method: Explanation Education comprehension: verbalized understanding  HOME EXERCISE PROGRAM: 05/06/23 - Coordination HEP (see pt instructions)   GOALS: Goals reviewed with patient? Yes  SHORT TERM GOALS: Target date: 05/29/23  Pt will be ind with RUE HEP using visual handouts. Baseline: new to outpt OT Goal status: IN PROGRESS  2.  Pt will recall at least 3 sensory safety precautions for affected UE. Baseline: Pt reported tingling/numbness in Rt fingers and at proximal forearm, just distal to elbow. Pt reported R hand always has felt cold. Goal status: IN PROGRESS  3.  Pt will report no more than mild difficulty with dropping objects during daily functional tasks. Baseline: Pt reported dropping objects more often, losing grip on objects d/t sensation changes: moderate difficulty with dropping objects. Goal status: IN PROGRESS  LONG TERM GOALS: Target date: 06/26/23  Pt will report improved efficiency with typing and  piano tasks, indicating improved FM coordination.  Baseline: Piano: difficulty  with playing since CVA event. Typing: difficulty since CVA event. Goal status: IN PROGRESS  2.  Pt will report handwriting improved to 8 out of 10 compared to PLOF using A/E and adaptive strategies PRN. Baseline: Per pt, handwriting is "different, less legible" since CVA event. Pt wrote simple sentence with approx. 90% legibility and good attention to alignment with baseline. Pt reported current handwriting at 6/10 level compared to previous handwriting level. Goal status: IN PROGRESS  3.  Pt will report no difficulty when completing work or other regular daily activities per QuickDASH. Baseline: Per QuickDASH, pt reported "mild" limitation in work or other regular daily activities. Goal status: IN PROGRESS   ASSESSMENT:  CLINICAL IMPRESSION: Patient demonstrates good understanding of strategies as discussed today as in regards to sensation and coordination. Pt demonstrating mild difficulty with coordination with translation of coins from palm to finger tips and with rotation of 2 golf balls in palm of hand.  Will continue to progress as needed.   PERFORMANCE DEFICITS: in functional skills including ADLs, IADLs, coordination, dexterity, proprioception, sensation, flexibility, Fine motor control, Gross motor control, balance, body mechanics, endurance, and UE functional use, cognitive skills including  none , and psychosocial skills including environmental adaptation.   IMPAIRMENTS: are limiting patient from ADLs, IADLs, leisure, and social participation.   CO-MORBIDITIES: may have co-morbidities  that affects occupational performance. Patient will benefit from skilled OT to address above impairments and improve overall function.  REHAB POTENTIAL: Good  PLAN:  OT FREQUENCY: 2x/week  OT DURATION: 6 weeks (dates extended to allow for scheduling)  PLANNED INTERVENTIONS: 97168 OT Re-evaluation, 97535 self  care/ADL training, 09811 therapeutic exercise, 97530 therapeutic activity, 97112 neuromuscular re-education, 97140 manual therapy, 97035 ultrasound, 97018 paraffin, 91478 fluidotherapy, 97010 moist heat, 97010 cryotherapy, 97034 contrast bath, passive range of motion, functional mobility training, energy conservation, patient/family education, and DME and/or AE instructions  RECOMMENDED OTHER SERVICES: PT eval already completed  CONSULTED AND AGREED WITH PLAN OF CARE: Patient  PLAN FOR NEXT SESSION:   Review - Sensory safety and proprioception HEP/handout and Sensory bin activities Typing practice - provide websites for typing practice (HEP)  Tendon glides HEP FM coordination HEP FM coordination activities   Kera Deacon, OTR/L 05/06/2023, 10:19 AM   [facility] at [facility] [address] [place], [place] [address] Phone # [phone] Fax # [phone]

## 2023-05-07 ENCOUNTER — Encounter: Payer: PPO | Admitting: Occupational Therapy

## 2023-05-07 ENCOUNTER — Telehealth: Payer: Self-pay | Admitting: Occupational Therapy

## 2023-05-07 NOTE — Telephone Encounter (Signed)
 Dr. Azucena Cecil, Abriel Geesey was seen today in Occupational therapy and asked about speech therapy as he reports increased swallowing issues. 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    If you agree, please place an order in [facility] in [facility] or fax the order to [phone]. Thank you, Rosalio Loud, OTR/L  [facility], [facility] [address] [place], [place]  [address] Phone:  [phone] Fax:  [phone]

## 2023-05-08 ENCOUNTER — Ambulatory Visit: Payer: PPO | Admitting: Occupational Therapy

## 2023-05-08 DIAGNOSIS — R29898 Other symptoms and signs involving the musculoskeletal system: Secondary | ICD-10-CM

## 2023-05-08 DIAGNOSIS — R208 Other disturbances of skin sensation: Secondary | ICD-10-CM

## 2023-05-08 DIAGNOSIS — R278 Other lack of coordination: Secondary | ICD-10-CM

## 2023-05-08 DIAGNOSIS — M6281 Muscle weakness (generalized): Secondary | ICD-10-CM | POA: Diagnosis not present

## 2023-05-08 NOTE — Therapy (Addendum)
 OUTPATIENT OCCUPATIONAL THERAPY NEURO TREATMENT  Patient Name: Timothy Brewer MRN: [id number] DOB:11-25-1940, 83 y.o., male Today's Date: 05/08/2023  PCP: [name], MD   REFERRING PROVIDER: [name], MD   END OF SESSION:  OT End of Session - 05/08/23 1210     Visit Number 4    Number of Visits 13    Date for OT Re-Evaluation 06/26/23    Authorization Type HTA 2025, Auth Not Reqd, VL: [place]    Progress Note Due on Visit 10    OT Start Time 0931    OT Stop Time 1015    OT Time Calculation (min) 44 min    Activity Tolerance Patient tolerated treatment well    Behavior During Therapy WFL for tasks assessed/performed               Past Medical History:  Diagnosis Date   Arthritis    Diabetes mellitus without complication (HCC)    Hyperlipidemia    Hypertension    Memory loss    Pre-diabetes    on metformin   Stroke [facility])    Past Surgical History:  Procedure Laterality Date   BACK SURGERY     c3,c4,c5,c6 ,l4,l5   EYE SURGERY     bil cataracts   NASAL SEPTUM SURGERY     TONSILLECTOMY     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   Procedure: LEFT TOTAL HIP ARTHROPLASTY ANTERIOR APPROACH;  Surgeon: Durene Romans, MD;  Location: [facility];  Service: Orthopedics;  Laterality: Left;  70 mins   Patient Active Problem List   Diagnosis Date Noted   Acute CVA (cerebrovascular accident) (HCC) 03/25/2023   Type 2 diabetes mellitus (HCC) 03/25/2023   Hyperkalemia 09/12/2020   Type 2 diabetes mellitus with hyperglycemia, without long-term current use of insulin (HCC) 09/12/2020   Hypertension associated with diabetes (HCC) 09/12/2020   GERD without esophagitis 09/12/2020   Hyperlipidemia associated with type 2 diabetes mellitus (HCC) 09/12/2020   Chronic diastolic (congestive) heart failure (HCC) 09/12/2020   TIA (transient ischemic attack) 06/03/2020   Aphasia    Right leg weakness    CVA (cerebral vascular accident) (HCC) 06/07/2017   Overweight (BMI 25.0-29.9)  02/18/2017   S/P left THA, 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 At times difficulty manipulating silverware/fork.  Pt accompanied by: self  PERTINENT HISTORY: Per 04/28/23 PT Note: DB, HTN, neck surgery, back surgery, L THA   Per 04/28/23 PT Note: "Patient is status post CVA on March 25, 2023. Has had right sided weakness and sensation issues since the stroke. Reports he has not needed to use his cane or walker but does feel a little off balance. Reports he had an almost fall a couple days ago as his leg was really numb but that has since improved. Reports he is most concerned about his balance. He was previously going to Silver sneakers at the gym and would like to get back to this. 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 Bascom Levels will refer patient to outpatient vascular surgery"  PRECAUTIONS: Fall  WEIGHT BEARING RESTRICTIONS: No  PAIN:  Are you having pain? No  FALLS: Has patient fallen in last 6 months? No  LIVING ENVIRONMENT: Lives with: lives with their spouse Lives in: House/apartment Stairs: No Has following equipment at home: Single point cane, Environmental consultant - 2 wheeled, and Grab bars  PLOF: Independent  with basic ADLs, currently driving, Hobbies: piano  PATIENT GOALS: to improve typing and "getting rid of numbness"  OBJECTIVE:  Note: Objective measures were completed at Evaluation unless otherwise noted.  HAND DOMINANCE: Right  ADLs: Overall ADLs: ind Transfers/ambulation related to ADLs: Eating: ind Grooming: ind UB Dressing: ind, no difficulty with buttons/zippers. LB Dressing: ind, no difficulty with shoelaces Toileting: ind Bathing: ind Tub Shower transfers: ind, cautious Equipment: Grab bars, Walk in shower, and Reacher  IADLs: Shopping: ind, spouse and pt complete task together Light housekeeping: ind Meal Prep: ind Community mobility: currently driving, no concerns per pt Medication management: ind, medication organizer  Handwriting:  Per pt "different, less legible" since CVA event.  Pt wrote simple sentence with approx. 90% legibility and good attention to alignment with baseline. Pt reported current handwriting at 6/10 compared to previous handwriting level.  Piano: difficulty with playing since CVA event. Typing: difficulty since CVA event.  MOBILITY STATUS: Independent without A/E  POSTURE COMMENTS:  Ind sitting balance, forward head posture  ACTIVITY TOLERANCE: Activity tolerance: Per pt, no change compared to PLOF.  FUNCTIONAL OUTCOME MEASURES: Quick Dash: 6.8% deficit    UPPER EXTREMITY ROM:    Active ROM Right eval Left eval  Shoulder flexion [facility]  Shoulder abduction    Shoulder adduction    Shoulder extension    Shoulder internal rotation    Shoulder external rotation    Elbow flexion    Elbow extension    Wrist flexion    Wrist extension    Wrist ulnar deviation    Wrist radial deviation    Wrist pronation    Wrist supination    (Blank rows = not tested)  Composite digit flex/ext - WFL BUE  UPPER EXTREMITY MMT:     MMT Right eval Left eval  Shoulder flexion 5 5  Shoulder abduction    Shoulder adduction    Shoulder  extension    Shoulder internal rotation    Shoulder external rotation    Middle trapezius    Lower trapezius    Elbow flexion    Elbow extension    Wrist flexion    Wrist extension    Wrist ulnar deviation    Wrist radial deviation    Wrist pronation    Wrist supination    (Blank rows = not tested)  HAND FUNCTION: Grip strength: Right: 56, 53, 50 (53 lbs average) lbs; Left: 55, 54, 49 (52.7 lbs average) lbs and Tip pinch: Right 18 lbs, Left: 18.5 lbs  COORDINATION: 9 Hole Peg test: Right: 20 sec; Left: 23 sec  Pt reported dropping objects more often, losing grip on objects d/t sensation changes: moderate difficulty with dropping objects.  SENSATION: Pt reported tingling/numbness in Rt fingers and at proximal forearm, just distal to elbow. Pt reported R hand always feels cold since the CVA event.  EDEMA: Pt reported R hand "feels puffy" in morning though has not noticed any  visual edema. Pt reported feeling resolves in a few minutes.  MUSCLE TONE: RUE: Within functional limits and LUE: Within functional limits  COGNITION: Overall cognitive status: Within functional limits for tasks assessed. Per pt, no changes.  VISION: Subjective report: Per pt, no changes. Pt accurately read clock on wall. Baseline vision: Wears glasses for reading only Visual history: cataracts and subsequent corrective surgery  VISION ASSESSMENT: Not tested  PERCEPTION: Not tested  PRAXIS: Not tested  OBSERVATIONS: Pt was pleasant and ambulated ind without A/E.                                                                                                                             TREATMENT :   05/08/23 Pt performed SCIFIT level 2 for 5 minutes for weight bearing, UB strengthening, and endurance/activity tolerance building  Pt performing modified wall push ups for 1x15 for additional weightbearing to improve proprioception and body awareness Pt using rough towel fr various textures along RUE  at all places with abnormal sensation to improve return Pt performing various FMC based activities with small coins, screws, pins, beans, etc with RUE for pincer grasp, in hand translation, crossing midline, and accuracy for placement to improve RUE hand function therapeutic discussion with the pt as well regarding leisure activities like piano at home to maximize improvement and resume leisure activities to improve RUE function and sensation  05/06/23 Sensation: engaged in retrieving various items from sensory bin with R hand.  Pt able to locate lego pieces, dice, and coins with increased time.  Pt with increased difficulty locating coins due to flat/smooth in nature.  Desensitization:  reiterated education from previous session in regards to various textures and challenging sensation with and without vision while utilizing vision for safety with heavy, hot/cold, sharp, etc items.   Coordination: w/ LUE including: picking up various small objects/coins and placing into container to challenge sensation and grasp on various sized items,  picking up coins and stacking them, and picking up 5-10 coins 1 at a time and translating palm to fingertips to place into coin slot.  Pt dropping coins x1 with translation from palm to finger tips.  Engaged in pen tricks with Pen rotation, flip, and shift with pen. OT providing demonstration and cues for increased carryover.  Pt then rotating 2 golf balls in palm of R hand with improved each going clockwise over counter clockwise.    PATIENT EDUCATION: Education details: see today's tx above Person educated: Patient Education method: Explanation Education comprehension: verbalized understanding  HOME EXERCISE PROGRAM: 05/06/23 - Coordination HEP (see pt instructions)   GOALS: Goals reviewed with patient? Yes  SHORT TERM GOALS: Target date: 05/29/23  Pt will be ind with RUE HEP using visual handouts. Baseline: new to outpt OT Goal status: IN PROGRESS  2.  Pt  will recall at least 3 sensory safety precautions for affected UE. Baseline: Pt reported tingling/numbness in Rt fingers and at proximal forearm, just distal to  elbow. Pt reported R hand always has felt cold. Goal status: IN PROGRESS  3.  Pt will report no more than mild difficulty with dropping objects during daily functional tasks. Baseline: Pt reported dropping objects more often, losing grip on objects d/t sensation changes: moderate difficulty with dropping objects. Goal status: IN PROGRESS  LONG TERM GOALS: Target date: 06/26/23  Pt will report improved efficiency with typing and piano tasks, indicating improved FM coordination.  Baseline: Piano: difficulty with playing since CVA event. Typing: difficulty since CVA event. Goal status: IN PROGRESS  2.  Pt will report handwriting improved to 8 out of 10 compared to PLOF using A/E and adaptive strategies PRN. Baseline: Per pt, handwriting is "different, less legible" since CVA event. Pt wrote simple sentence with approx. 90% legibility and good attention to alignment with baseline. Pt reported current handwriting at 6/10 level compared to previous handwriting level. Goal status: IN PROGRESS  3.  Pt will report no difficulty when completing work or other regular daily activities per QuickDASH. Baseline: Per QuickDASH, pt reported "mild" limitation in work or other regular daily activities. Goal status: IN PROGRESS   ASSESSMENT:  CLINICAL IMPRESSION: Patient demonstrates good understanding of strategies as discussed today as in regards to sensation and coordination. Pt demonstrating mild difficulty with coordination with translation of coins from palm to finger tips and with rotation of 2 golf balls in palm of hand.  Will continue to progress as needed.   PERFORMANCE DEFICITS: in functional skills including ADLs, IADLs, coordination, dexterity, proprioception, sensation, flexibility, Fine motor control, Gross motor control, balance, body  mechanics, endurance, and UE functional use, cognitive skills including  none , and psychosocial skills including environmental adaptation.   IMPAIRMENTS: are limiting patient from ADLs, IADLs, leisure, and social participation.   CO-MORBIDITIES: may have co-morbidities  that affects occupational performance. Patient will benefit from skilled OT to address above impairments and improve overall function.  REHAB POTENTIAL: Good  PLAN:  OT FREQUENCY: 2x/week  OT DURATION: 6 weeks (dates extended to allow for scheduling)  PLANNED INTERVENTIONS: 97168 OT Re-evaluation, 97535 self care/ADL training, 13086 therapeutic exercise, 97530 therapeutic activity, 97112 neuromuscular re-education, 97140 manual therapy, 97035 ultrasound, 97018 paraffin, 57846 fluidotherapy, 97010 moist heat, 97010 cryotherapy, 97034 contrast bath, passive range of motion, functional mobility training, energy conservation, patient/family education, and DME and/or AE instructions  RECOMMENDED OTHER SERVICES: PT eval already completed  CONSULTED AND AGREED WITH PLAN OF CARE: Patient  PLAN FOR NEXT SESSION:   Review - Sensory safety and proprioception HEP/handout and Sensory bin activities Typing practice - provide websites for typing practice (HEP)  Tendon glides HEP FM coordination HEP FM coordination activities   Erasmo Score, OTR/L 05/08/2023, 12:12 PM   [facility] [address] [place], [place] [address] Phone # [phone] Fax # [phone]        [place] [facility] [address], [address] [place], [place], [address] Phone: [phone]   Fax:  [phone]  Patient Details  Name: Timothy Brewer MRN: [id number] Date of Birth: [birth year] Referring Provider:  [name], MD  Encounter Date: 05/08/2023   Erasmo Score, OT 05/08/2023, 12:12 PM  [place] [facility] [address] [place], [place], [address] Phone: [phone]   Fax:  [phone]

## 2023-05-12 ENCOUNTER — Ambulatory Visit: Payer: PPO | Attending: Family Medicine | Admitting: Occupational Therapy

## 2023-05-12 ENCOUNTER — Encounter: Payer: PPO | Admitting: Occupational Therapy

## 2023-05-12 DIAGNOSIS — R278 Other lack of coordination: Secondary | ICD-10-CM | POA: Diagnosis not present

## 2023-05-12 DIAGNOSIS — M6281 Muscle weakness (generalized): Secondary | ICD-10-CM | POA: Insufficient documentation

## 2023-05-12 DIAGNOSIS — R29898 Other symptoms and signs involving the musculoskeletal system: Secondary | ICD-10-CM | POA: Diagnosis not present

## 2023-05-12 DIAGNOSIS — R208 Other disturbances of skin sensation: Secondary | ICD-10-CM | POA: Diagnosis not present

## 2023-05-12 NOTE — Therapy (Signed)
 OUTPATIENT OCCUPATIONAL THERAPY NEURO TREATMENT  Patient Name: Timothy Brewer MRN: [id number] DOB:1941-01-06, 83 y.o., male Today's Date: 05/12/2023  PCP: [name], MD   REFERRING PROVIDER: [name], MD   END OF SESSION:  OT End of Session - 05/12/23 0933     Visit Number 5    Number of Visits 13    Date for OT Re-Evaluation 06/26/23    Authorization Type HTA 2025, Auth Not Reqd, VL: [place]    Progress Note Due on Visit 10    OT Start Time 0933    OT Stop Time 1013    OT Time Calculation (min) 40 min    Activity Tolerance Patient tolerated treatment well    Behavior During Therapy WFL for tasks assessed/performed                Past Medical History:  Diagnosis Date   Arthritis    Diabetes mellitus without complication (HCC)    Hyperlipidemia    Hypertension    Memory loss    Pre-diabetes    on metformin   Stroke [facility])    Past Surgical History:  Procedure Laterality Date   BACK SURGERY     c3,c4,c5,c6 ,l4,l5   EYE SURGERY     bil cataracts   NASAL SEPTUM SURGERY     TONSILLECTOMY     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   Procedure: LEFT TOTAL HIP ARTHROPLASTY ANTERIOR APPROACH;  Surgeon: Durene Romans, MD;  Location: [facility];  Service: Orthopedics;  Laterality: Left;  70 mins   Patient Active Problem List   Diagnosis Date Noted   Acute CVA (cerebrovascular accident) (HCC) 03/25/2023   Type 2 diabetes mellitus (HCC) 03/25/2023   Hyperkalemia 09/12/2020   Type 2 diabetes mellitus with hyperglycemia, without long-term current use of insulin (HCC) 09/12/2020   Hypertension associated with diabetes (HCC) 09/12/2020   GERD without esophagitis 09/12/2020   Hyperlipidemia associated with type 2 diabetes mellitus (HCC) 09/12/2020   Chronic diastolic (congestive) heart failure (HCC) 09/12/2020   TIA (transient ischemic attack) 06/03/2020   Aphasia    Right leg weakness    CVA (cerebral vascular accident) (HCC) 06/07/2017   Overweight (BMI 25.0-29.9)  02/18/2017   S/P left THA, 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 Pt reports dropping a full bag of coffee beans.  Pt accompanied by: self  PERTINENT HISTORY: Per 04/28/23 PT Note: DB, HTN, neck surgery, back surgery, L THA   Per 04/28/23 PT Note: "Patient is status post CVA on March 25, 2023. Has had right sided weakness and sensation issues since the stroke. Reports he has not needed to use his cane or walker but does feel a little off balance. Reports he had an almost fall a couple days ago as his leg was really numb but that has since improved. Reports he is most concerned about his balance. He was previously going to Silver sneakers at the gym and would like to get back to this. 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 Bascom Levels will refer patient to outpatient vascular surgery"  PRECAUTIONS: Fall  WEIGHT BEARING RESTRICTIONS: No  PAIN:  Are you having pain? No  FALLS: Has patient fallen in last 6 months? No  LIVING ENVIRONMENT: Lives with: lives with their spouse Lives in: House/apartment Stairs: No Has following equipment at home: Single point cane, Environmental consultant - 2 wheeled, and Grab bars  PLOF: Independent with basic ADLs,  currently driving, Hobbies: piano  PATIENT GOALS: to improve typing and "getting rid of numbness"  OBJECTIVE:  Note: Objective measures were completed at Evaluation unless otherwise noted.  HAND DOMINANCE: Right  ADLs: Overall ADLs: ind Transfers/ambulation related to ADLs: Eating: ind Grooming: ind UB Dressing: ind, no difficulty with buttons/zippers. LB Dressing: ind, no difficulty with shoelaces Toileting: ind Bathing: ind Tub Shower transfers: ind, cautious Equipment: Grab bars, Walk in shower, and Reacher  IADLs: Shopping: ind, spouse and pt complete task together Light housekeeping: ind Meal Prep: ind Community mobility: currently driving, no concerns per pt Medication management: ind, medication organizer  Handwriting:  Per pt "different, less legible" since CVA event.  Pt wrote simple sentence with approx. 90% legibility and good attention to alignment with baseline. Pt reported current handwriting at 6/10 compared to previous handwriting level.  Piano: difficulty with playing since CVA event. Typing: difficulty since CVA event.  MOBILITY STATUS: Independent without A/E  POSTURE COMMENTS:  Ind sitting balance, forward head posture  ACTIVITY TOLERANCE: Activity tolerance: Per pt, no change compared to PLOF.  FUNCTIONAL OUTCOME MEASURES: Quick Dash: 6.8% deficit    UPPER EXTREMITY ROM:    Active ROM Right eval Left eval  Shoulder flexion [facility]  Shoulder abduction    Shoulder adduction    Shoulder extension    Shoulder internal rotation    Shoulder external rotation    Elbow flexion    Elbow extension    Wrist flexion    Wrist extension    Wrist ulnar deviation    Wrist radial deviation    Wrist pronation    Wrist supination    (Blank rows = not tested)  Composite digit flex/ext - WFL BUE  UPPER EXTREMITY MMT:     MMT Right eval Left eval  Shoulder flexion 5 5  Shoulder abduction    Shoulder adduction    Shoulder extension     Shoulder internal rotation    Shoulder external rotation    Middle trapezius    Lower trapezius    Elbow flexion    Elbow extension    Wrist flexion    Wrist extension    Wrist ulnar deviation    Wrist radial deviation    Wrist pronation    Wrist supination    (Blank rows = not tested)  HAND FUNCTION: Grip strength: Right: 56, 53, 50 (53 lbs average) lbs; Left: 55, 54, 49 (52.7 lbs average) lbs and Tip pinch: Right 18 lbs, Left: 18.5 lbs  COORDINATION: 9 Hole Peg test: Right: 20 sec; Left: 23 sec  Pt reported dropping objects more often, losing grip on objects d/t sensation changes: moderate difficulty with dropping objects.  SENSATION: Pt reported tingling/numbness in Rt fingers and at proximal forearm, just distal to elbow. Pt reported R hand always feels cold since the CVA event.  EDEMA: Pt reported R hand "feels puffy" in morning though has not  noticed any visual edema. Pt reported feeling resolves in a few minutes.  MUSCLE TONE: RUE: Within functional limits and LUE: Within functional limits  COGNITION: Overall cognitive status: Within functional limits for tasks assessed. Per pt, no changes.  VISION: Subjective report: Per pt, no changes. Pt accurately read clock on wall. Baseline vision: Wears glasses for reading only Visual history: cataracts and subsequent corrective surgery  VISION ASSESSMENT: Not tested  PERCEPTION: Not tested  PRAXIS: Not tested  OBSERVATIONS: Pt was pleasant and ambulated ind without A/E.                                                                                                                             TREATMENT :   05/12/23 Coordination: picking up 5-10 coins 1 at a time and translating palm to fingertips to place into coin slot with RUE.  Pt dropping coins x3 with translation from palm to finger tips.  Pt dropping more coins when talking due to decreased visual attention to task to compensate for impaired sensation.  OT  educating pt on decreased attempts at multi-tasking, removing extra stimulus to allow for increased attention to task to compensate for impaired sensation.  Pt then rotating 2 golf balls in palm of R hand with improved ease going clockwise over counter clockwise.  Typing: completed 2 mins typing with 42 WPM and 2 errors, correcting to 41 WPM overall.  Pt then completed additional typing passage with 42 WPM and 5 errors, correcting to 39 WPM overall.  Pt demonstrating errors with synching up shift and letter to make capital letters.  Pt also with "heavy" right hand and making errant strokes intermittently impacting accuracy of typing.      05/08/23 Pt performed SCIFIT level 2 for 5 minutes for weight bearing, UB strengthening, and endurance/activity tolerance building  Pt performing modified wall push ups for 1x15 for additional weightbearing to improve proprioception and body awareness Pt using rough towel fr various textures along RUE at all places with abnormal sensation to improve return Pt performing various FMC based activities with small coins, screws, pins, beans, etc with RUE for pincer grasp, in hand translation, crossing midline, and accuracy for placement to improve RUE hand function therapeutic discussion with the pt as well regarding leisure activities like piano at home to maximize improvement and resume leisure activities to improve RUE function and sensation    05/06/23 Sensation: engaged in retrieving various items from sensory bin with R hand.  Pt able to locate lego pieces, dice, and coins with increased time.  Pt with increased difficulty locating coins due to flat/smooth in nature.  Desensitization:  reiterated education from previous session in regards to various textures and challenging sensation with and without vision while utilizing vision for safety with heavy, hot/cold, sharp, etc items.   Coordination: w/ LUE including: picking up various small objects/coins and placing  into container to challenge sensation and grasp on various sized items,  picking up coins  and stacking them, and picking up 5-10 coins 1 at a time and translating palm to fingertips to place into coin slot.  Pt dropping coins x1 with translation from palm to finger tips.  Engaged in pen tricks with Pen rotation, flip, and shift with pen. OT providing demonstration and cues for increased carryover.  Pt then rotating 2 golf balls in palm of R hand with improved each going clockwise over counter clockwise.    PATIENT EDUCATION: Education details: see today's tx above Person educated: Patient Education method: Explanation Education comprehension: verbalized understanding  HOME EXERCISE PROGRAM: 05/06/23 - Coordination HEP (see pt instructions)   GOALS: Goals reviewed with patient? Yes  SHORT TERM GOALS: Target date: 05/29/23  Pt will be ind with RUE HEP using visual handouts. Baseline: new to outpt OT Goal status: IN PROGRESS  2.  Pt will recall at least 3 sensory safety precautions for affected UE. Baseline: Pt reported tingling/numbness in Rt fingers and at proximal forearm, just distal to elbow. Pt reported R hand always has felt cold. Goal status: IN PROGRESS  3.  Pt will report no more than mild difficulty with dropping objects during daily functional tasks. Baseline: Pt reported dropping objects more often, losing grip on objects d/t sensation changes: moderate difficulty with dropping objects. Goal status: IN PROGRESS  LONG TERM GOALS: Target date: 06/26/23  Pt will report improved efficiency with typing and piano tasks, indicating improved FM coordination.  Baseline: Piano: difficulty with playing since CVA event. Typing: difficulty since CVA event. Goal status: IN PROGRESS  2.  Pt will report handwriting improved to 8 out of 10 compared to PLOF using A/E and adaptive strategies PRN. Baseline: Per pt, handwriting is "different, less legible" since CVA event. Pt wrote simple  sentence with approx. 90% legibility and good attention to alignment with baseline. Pt reported current handwriting at 6/10 level compared to previous handwriting level. Goal status: IN PROGRESS  3.  Pt will report no difficulty when completing work or other regular daily activities per QuickDASH. Baseline: Per QuickDASH, pt reported "mild" limitation in work or other regular daily activities. Goal status: IN PROGRESS   ASSESSMENT:  CLINICAL IMPRESSION: Patient demonstrates good understanding of strategies as discussed today as in regards to sensation and coordination. Pt demonstrating mild difficulty with coordination with translation of coins from palm to finger tips with increased drops this session compared to previous session.  Pt demonstrating decreased coordination with synching shift and letters for capital letters as well as making errant stokes with R hand.    PERFORMANCE DEFICITS: in functional skills including ADLs, IADLs, coordination, dexterity, proprioception, sensation, flexibility, Fine motor control, Gross motor control, balance, body mechanics, endurance, and UE functional use, cognitive skills including  none , and psychosocial skills including environmental adaptation.   IMPAIRMENTS: are limiting patient from ADLs, IADLs, leisure, and social participation.   CO-MORBIDITIES: may have co-morbidities  that affects occupational performance. Patient will benefit from skilled OT to address above impairments and improve overall function.  REHAB POTENTIAL: Good  PLAN:  OT FREQUENCY: 2x/week  OT DURATION: 6 weeks (dates extended to allow for scheduling)  PLANNED INTERVENTIONS: 97168 OT Re-evaluation, 97535 self care/ADL training, 47829 therapeutic exercise, 97530 therapeutic activity, 97112 neuromuscular re-education, 97140 manual therapy, 97035 ultrasound, 97018 paraffin, 56213 fluidotherapy, 97010 moist heat, 97010 cryotherapy, 97034 contrast bath, passive range of motion,  functional mobility training, energy conservation, patient/family education, and DME and/or AE instructions  RECOMMENDED OTHER SERVICES: PT eval already completed  CONSULTED AND AGREED  WITH PLAN OF CARE: Patient  PLAN FOR NEXT SESSION:   Review - Sensory safety and proprioception HEP/handout and Sensory bin activities Typing practice - provide websites for typing practice (HEP)  Tendon glides HEP FM coordination HEP FM coordination activities   Shiza Thelen, OTR/L 05/12/2023, 9:34 AM   [facility] at [facility] [address] [place], [place] [address] Phone # [phone] Fax # [phone]

## 2023-05-14 ENCOUNTER — Encounter: Payer: PPO | Admitting: Occupational Therapy

## 2023-05-14 ENCOUNTER — Ambulatory Visit: Payer: PPO | Admitting: Occupational Therapy

## 2023-05-14 DIAGNOSIS — R208 Other disturbances of skin sensation: Secondary | ICD-10-CM

## 2023-05-14 DIAGNOSIS — R278 Other lack of coordination: Secondary | ICD-10-CM

## 2023-05-14 DIAGNOSIS — R29898 Other symptoms and signs involving the musculoskeletal system: Secondary | ICD-10-CM

## 2023-05-14 NOTE — Therapy (Signed)
 OUTPATIENT OCCUPATIONAL THERAPY NEURO TREATMENT & Discharge  Patient Name: Timothy Brewer MRN: [id number] DOB:[birth year], 83 y.o., male Today's Date: 05/14/2023  PCP: [name], MD   REFERRING PROVIDER: [name], MD   OCCUPATIONAL THERAPY DISCHARGE SUMMARY  Visits from Start of Care: 6  Current functional level related to goals / functional outcomes: Pt met 2 of 3 STG and 3 of 3 LTGs.  Pt demonstrating and reporting improvements in coordination, sensation, and ability to compensate for impaired sensation.  Pt pleased with current status and reports "knowing what to do" to continue to compensate for impaired (but improving) sensation and coordination.     Remaining deficits: Mild sensation impairments   Education / Equipment: Education administrator, typing programs, coordination HEP   Patient agrees to discharge. Patient goals were met. Patient is being discharged due to being pleased with the current functional level..     END OF SESSION:  OT End of Session - 05/14/23 1032     Visit Number 6    Number of Visits 13    Date for OT Re-Evaluation 06/26/23    Authorization Type HTA 2025, Auth Not Reqd, VL: [place]    Progress Note Due on Visit 10    OT Start Time 1026   arrival time   OT Stop Time 1100    OT Time Calculation (min) 34 min    Activity Tolerance Patient tolerated treatment well    Behavior During Therapy WFL for tasks assessed/performed                 Past Medical History:  Diagnosis Date   Arthritis    Diabetes mellitus without complication (HCC)    Hyperlipidemia    Hypertension    Memory loss    Pre-diabetes    on metformin   Stroke [facility])    Past Surgical History:  Procedure Laterality Date   BACK SURGERY     c3,c4,c5,c6 ,l4,l5   EYE SURGERY     bil cataracts   NASAL SEPTUM SURGERY     TONSILLECTOMY     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   Procedure: LEFT TOTAL HIP ARTHROPLASTY ANTERIOR APPROACH;  Surgeon: Durene Romans,  MD;  Location: [facility];  Service: Orthopedics;  Laterality: Left;  70 mins   Patient Active Problem List   Diagnosis Date Noted   Acute CVA (cerebrovascular accident) (HCC) 03/25/2023   Type 2 diabetes mellitus (HCC) 03/25/2023   Hyperkalemia 09/12/2020   Type 2 diabetes mellitus with hyperglycemia, without long-term current use of insulin (HCC) 09/12/2020   Hypertension associated with diabetes (HCC) 09/12/2020   GERD without esophagitis 09/12/2020   Hyperlipidemia associated with type 2 diabetes mellitus (HCC) 09/12/2020   Chronic diastolic (congestive) heart failure (HCC) 09/12/2020   TIA (transient ischemic attack) 06/03/2020   Aphasia    Right leg weakness    CVA (cerebral vascular accident) (HCC) 06/07/2017   Overweight (BMI 25.0-29.9) 02/18/2017   S/P left THA, 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  Pt reports "I think this will be my last visit."  Pt accompanied by: self  PERTINENT  HISTORY: Per 04/28/23 PT Note: DB, HTN, neck surgery, back surgery, L THA   Per 04/28/23 PT Note: "Patient is status post CVA on March 25, 2023. Has had right sided weakness and sensation issues since the stroke. Reports he has not needed to use his cane or walker but does feel a little off balance. Reports he had an almost fall a couple days ago as his leg was really numb but that has since improved. Reports he is most concerned about his balance. He was previously going to Silver sneakers at the gym and would like to get back to this.  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 Bascom Levels will refer patient to outpatient vascular surgery"  PRECAUTIONS: Fall  WEIGHT BEARING RESTRICTIONS: No  PAIN:  Are you having pain? No  FALLS: Has patient fallen in last 6 months? No  LIVING ENVIRONMENT: Lives with: lives with their spouse Lives in: House/apartment Stairs: No Has following equipment at home: Single point cane, Environmental consultant - 2 wheeled, and Grab bars  PLOF: Independent with basic ADLs, currently driving, Hobbies: piano  PATIENT GOALS: to improve typing and "getting rid of numbness"  OBJECTIVE:  Note: Objective measures were completed at Evaluation unless otherwise noted.  HAND DOMINANCE: Right  ADLs: Overall ADLs: ind Transfers/ambulation related to ADLs: Eating: ind Grooming: ind UB Dressing: ind, no difficulty with buttons/zippers. LB Dressing: ind, no difficulty with shoelaces Toileting: ind Bathing: ind Tub Shower transfers: ind, cautious Equipment: Grab bars, Walk in shower, and Reacher  IADLs: Shopping: ind, spouse and pt complete task together Light housekeeping: ind Meal Prep: ind Community mobility: currently driving, no concerns per pt Medication management: ind, medication organizer  Handwriting:  Per pt "different, less legible" since CVA event.  Pt wrote simple sentence with approx. 90% legibility and good attention to alignment with baseline. Pt reported current handwriting at 6/10 compared to previous handwriting level.  Piano: difficulty with playing since CVA event. Typing: difficulty since CVA event.  MOBILITY STATUS: Independent without A/E  POSTURE COMMENTS:  Ind sitting balance, forward head  posture  ACTIVITY TOLERANCE: Activity tolerance: Per pt, no change compared to PLOF.  FUNCTIONAL OUTCOME MEASURES: Quick Dash: 6.8% deficit    UPPER EXTREMITY ROM:    Active ROM Right eval Left eval  Shoulder flexion [facility]  Shoulder abduction    Shoulder adduction    Shoulder extension    Shoulder internal rotation    Shoulder external rotation    Elbow flexion    Elbow extension    Wrist flexion    Wrist extension    Wrist ulnar deviation    Wrist radial deviation    Wrist pronation    Wrist supination    (Blank rows = not tested)  Composite digit flex/ext - WFL BUE  UPPER EXTREMITY MMT:     MMT Right eval Left eval  Shoulder flexion 5 5  Shoulder abduction    Shoulder adduction    Shoulder extension    Shoulder internal rotation    Shoulder external rotation    Middle trapezius    Lower trapezius    Elbow flexion    Elbow extension    Wrist flexion    Wrist extension    Wrist ulnar deviation  Wrist radial deviation    Wrist pronation    Wrist supination    (Blank rows = not tested)  HAND FUNCTION: Grip strength: Right: 56, 53, 50 (53 lbs average) lbs; Left: 55, 54, 49 (52.7 lbs average) lbs and Tip pinch: Right 18 lbs, Left: 18.5 lbs  COORDINATION: 9 Hole Peg test: Right: 20 sec; Left: 23 sec  Pt reported dropping objects more often, losing grip on objects d/t sensation changes: moderate difficulty with dropping objects.  SENSATION: Pt reported tingling/numbness in Rt fingers and at proximal forearm, just distal to elbow. Pt reported R hand always feels cold since the CVA event.  EDEMA: Pt reported R hand "feels puffy" in morning though has not noticed any visual edema. Pt reported feeling resolves in a few minutes.  MUSCLE TONE: RUE: Within functional limits and LUE: Within functional limits  COGNITION: Overall cognitive status: Within functional limits for tasks assessed. Per pt, no changes.  VISION: Subjective report: Per pt, no  changes. Pt accurately read clock on wall. Baseline vision: Wears glasses for reading only Visual history: cataracts and subsequent corrective surgery  VISION ASSESSMENT: Not tested  PERCEPTION: Not tested  PRAXIS: Not tested  OBSERVATIONS: Pt was pleasant and ambulated ind without A/E.                                                                                                                             TREATMENT :   05/14/23 Coordination: engaged in small peg board pattern replication, initially picking up and placing pegs one at a time then transitioning to in-hand manipulation and translation of pegs from palm to finger tips.  Pt demonstrating increased difficulty with in-hand manipulation and translation, dropping 1-2 pegs. Tangram puzzles: engaged in coordination and sensation task when picking up small shape pieces and fitting into tangram puzzle.  OT then increased challenge to placing shapes pieces into container with dried beans to further challenge sensation.  Pt with good ability to recognize shapes in hand, however with continued difficulty when in mixed textures.   Self-care: pt able to recognize and report 3 strategies to compensate for impaired sensation (see below goal section), pt reporting improvements with carrying items when visually attending to task.  Pt continues to report improvements with typing and playing piano - OT encouraged pt to continue to engage in these motivating tasks.      05/12/23 Coordination: picking up 5-10 coins 1 at a time and translating palm to fingertips to place into coin slot with RUE.  Pt dropping coins x3 with translation from palm to finger tips.  Pt dropping more coins when talking due to decreased visual attention to task to compensate for impaired sensation.  OT educating pt on decreased attempts at multi-tasking, removing extra stimulus to allow for increased attention to task to compensate for impaired sensation.  Pt then rotating 2  golf balls in palm of R hand with improved ease going clockwise over counter clockwise.  Typing: completed 2 mins typing with 42 WPM and 2 errors, correcting to 41 WPM overall.  Pt then completed additional typing passage with 42 WPM and 5 errors, correcting to 39 WPM overall.  Pt demonstrating errors with synching up shift and letter to make capital letters.  Pt also with "heavy" right hand and making errant strokes intermittently impacting accuracy of typing.      05/08/23 Pt performed SCIFIT level 2 for 5 minutes for weight bearing, UB strengthening, and endurance/activity tolerance building  Pt performing modified wall push ups for 1x15 for additional weightbearing to improve proprioception and body awareness Pt using rough towel fr various textures along RUE at all places with abnormal sensation to improve return Pt performing various FMC based activities with small coins, screws, pins, beans, etc with RUE for pincer grasp, in hand translation, crossing midline, and accuracy for placement to improve RUE hand function therapeutic discussion with the pt as well regarding leisure activities like piano at home to maximize improvement and resume leisure activities to improve RUE function and sensation   PATIENT EDUCATION: Education details: see today's tx above Person educated: Patient Education method: Explanation Education comprehension: verbalized understanding  HOME EXERCISE PROGRAM: 05/06/23 - Coordination HEP (see pt instructions)   GOALS: Goals reviewed with patient? Yes  SHORT TERM GOALS: Target date: 05/29/23  Pt will be ind with RUE HEP using visual handouts. Baseline: new to outpt OT Goal status: MET - 05/14/23  2.  Pt will recall at least 3 sensory safety precautions for affected UE. Baseline: Pt reported tingling/numbness in Rt fingers and at proximal forearm, just distal to elbow. Pt reported R hand always has felt cold. 05/14/23: pt reports needing to increase concentration  when maintaining hold on items, reports heat and cold has improved, using vision to compensate for impaired sensation.   Goal status: MET - 05/14/23  3.  Pt will report no more than mild difficulty with dropping objects during daily functional tasks. Baseline: Pt reported dropping objects more often, losing grip on objects d/t sensation changes: moderate difficulty with dropping objects. 05/14/23: pt reports improved concentration on task which is allowing him to drop items less, however still does drop items occasionally. Goal status: IN PROGRESS  LONG TERM GOALS: Target date: 06/26/23  Pt will report improved efficiency with typing and piano tasks, indicating improved FM coordination.  Baseline: Piano: difficulty with playing since CVA event. Typing: difficulty since CVA event. Goal status: MET - pt reports continued improvements   2.  Pt will report handwriting improved to 8 out of 10 compared to PLOF using A/E and adaptive strategies PRN. Baseline: Per pt, handwriting is "different, less legible" since CVA event. Pt wrote simple sentence with approx. 90% legibility and good attention to alignment with baseline. Pt reported current handwriting at 6/10 level compared to previous handwriting level. Goal status: MET - pt reports 8/10 level with handwriting.    3.  Pt will report no difficulty when completing work or other regular daily activities per QuickDASH. Baseline: Per QuickDASH, pt reported "mild" limitation in work or other regular daily activities. Goal status: MET - pt reports no difficulty with work or regular daily activities.   ASSESSMENT:  CLINICAL IMPRESSION: Pt demonstrating mild difficulty with coordination with translation of pegs from palm <> finger tips, dropping x2. Pt pleased with current status and reports "knowing what to do" to continue to compensate for impaired (but improving) sensation and coordination.    PERFORMANCE DEFICITS: in functional skills including ADLs,  IADLs, coordination, dexterity, proprioception, sensation, flexibility, Fine motor control, Gross motor control, balance, body mechanics, endurance, and UE functional use, cognitive skills including  none , and psychosocial skills including environmental adaptation.   IMPAIRMENTS: are limiting patient from ADLs, IADLs, leisure, and social participation.   CO-MORBIDITIES: may have co-morbidities  that affects occupational performance. Patient will benefit from skilled OT to address above impairments and improve overall function.  REHAB POTENTIAL: Good  PLAN:  OT FREQUENCY: 2x/week  OT DURATION: 6 weeks (dates extended to allow for scheduling)  PLANNED INTERVENTIONS: 97168 OT Re-evaluation, 97535 self care/ADL training, 44034 therapeutic exercise, 97530 therapeutic activity, 97112 neuromuscular re-education, 97140 manual therapy, 97035 ultrasound, 97018 paraffin, 74259 fluidotherapy, 97010 moist heat, 97010 cryotherapy, 97034 contrast bath, passive range of motion, functional mobility training, energy conservation, patient/family education, and DME and/or AE instructions  RECOMMENDED OTHER SERVICES: PT eval already completed  CONSULTED AND AGREED WITH PLAN OF CARE: Patient    Rosalio Loud, OTR/L 05/14/2023, 12:24 PM   [facility] [address] [place], [place] [address] Phone # [phone] Fax # [phone]

## 2023-05-15 ENCOUNTER — Other Ambulatory Visit (HOSPITAL_BASED_OUTPATIENT_CLINIC_OR_DEPARTMENT_OTHER): Payer: Self-pay | Admitting: Family Medicine

## 2023-05-15 DIAGNOSIS — M85832 Other specified disorders of bone density and structure, left forearm: Secondary | ICD-10-CM

## 2023-05-18 ENCOUNTER — Encounter: Payer: PPO | Admitting: Occupational Therapy

## 2023-05-19 ENCOUNTER — Encounter: Payer: PPO | Admitting: Occupational Therapy

## 2023-05-20 ENCOUNTER — Encounter: Payer: PPO | Admitting: Occupational Therapy

## 2023-05-21 ENCOUNTER — Encounter: Payer: PPO | Admitting: Occupational Therapy

## 2023-05-26 ENCOUNTER — Encounter: Payer: PPO | Admitting: Occupational Therapy

## 2023-05-28 ENCOUNTER — Encounter: Payer: PPO | Admitting: Occupational Therapy

## 2023-06-01 DIAGNOSIS — Z862 Personal history of diseases of the blood and blood-forming organs and certain disorders involving the immune mechanism: Secondary | ICD-10-CM | POA: Diagnosis not present

## 2023-06-01 DIAGNOSIS — E782 Mixed hyperlipidemia: Secondary | ICD-10-CM | POA: Diagnosis not present

## 2023-06-01 DIAGNOSIS — J439 Emphysema, unspecified: Secondary | ICD-10-CM | POA: Diagnosis not present

## 2023-06-01 DIAGNOSIS — J309 Allergic rhinitis, unspecified: Secondary | ICD-10-CM | POA: Diagnosis not present

## 2023-06-01 DIAGNOSIS — I6930 Unspecified sequelae of cerebral infarction: Secondary | ICD-10-CM | POA: Diagnosis not present

## 2023-06-01 DIAGNOSIS — Z8673 Personal history of transient ischemic attack (TIA), and cerebral infarction without residual deficits: Secondary | ICD-10-CM | POA: Diagnosis not present

## 2023-06-01 DIAGNOSIS — E1169 Type 2 diabetes mellitus with other specified complication: Secondary | ICD-10-CM | POA: Diagnosis not present

## 2023-06-01 DIAGNOSIS — I10 Essential (primary) hypertension: Secondary | ICD-10-CM | POA: Diagnosis not present

## 2023-06-01 DIAGNOSIS — G3184 Mild cognitive impairment, so stated: Secondary | ICD-10-CM | POA: Diagnosis not present

## 2023-06-01 DIAGNOSIS — N529 Male erectile dysfunction, unspecified: Secondary | ICD-10-CM | POA: Diagnosis not present

## 2023-06-02 ENCOUNTER — Encounter: Payer: PPO | Admitting: Occupational Therapy

## 2023-06-04 ENCOUNTER — Encounter: Payer: PPO | Admitting: Occupational Therapy

## 2023-06-09 ENCOUNTER — Encounter: Payer: PPO | Admitting: Occupational Therapy

## 2023-06-11 ENCOUNTER — Encounter: Payer: PPO | Admitting: Occupational Therapy

## 2023-06-16 ENCOUNTER — Ambulatory Visit (HOSPITAL_BASED_OUTPATIENT_CLINIC_OR_DEPARTMENT_OTHER)
Admission: RE | Admit: 2023-06-16 | Discharge: 2023-06-16 | Disposition: A | Discharge status: Home / Self Care | Source: Ambulatory Visit | Attending: Family Medicine | Admitting: Family Medicine

## 2023-06-16 ENCOUNTER — Other Ambulatory Visit: Payer: PPO

## 2023-06-16 DIAGNOSIS — Z0189 Encounter for other specified special examinations: Secondary | ICD-10-CM | POA: Diagnosis not present

## 2023-06-16 DIAGNOSIS — M85832 Other specified disorders of bone density and structure, left forearm: Secondary | ICD-10-CM | POA: Diagnosis not present

## 2023-06-24 ENCOUNTER — Other Ambulatory Visit (HOSPITAL_COMMUNITY): Payer: Self-pay | Admitting: Pharmacist

## 2023-06-24 MED ORDER — STUDY - OCEANIC-STROKE - ASUNDEXIAN 50 MG OR PLACEBO TABLET (PI-SETHI): 1.0000 | ORAL_TABLET | Freq: Every day | ORAL | 0 refills | Status: DC

## 2023-06-25 DIAGNOSIS — H608X2 Other otitis externa, left ear: Secondary | ICD-10-CM | POA: Diagnosis not present

## 2023-06-25 DIAGNOSIS — H1011 Acute atopic conjunctivitis, right eye: Secondary | ICD-10-CM | POA: Diagnosis not present

## 2023-07-01 DIAGNOSIS — H1011 Acute atopic conjunctivitis, right eye: Secondary | ICD-10-CM | POA: Diagnosis not present

## 2023-07-21 ENCOUNTER — Ambulatory Visit (INDEPENDENT_AMBULATORY_CARE_PROVIDER_SITE_OTHER): Payer: PPO | Admitting: Podiatry

## 2023-07-21 DIAGNOSIS — B351 Tinea unguium: Secondary | ICD-10-CM

## 2023-07-21 NOTE — Progress Notes (Unsigned)
  Chief Complaint  Patient presents with   Nail Problem    Pt presents for fungal nail recheck. States they looking better.    HPI: 83 y.o. male presents today for follow-up of fungal great toenails.  He is currently using the ciclopirox  nail lacquer on the bilateral hallux nails and the right third toenail.  He feels that there has been continued improvement, albeit slow.  Past Medical History:  Diagnosis Date   Arthritis    Diabetes mellitus without complication (HCC)    Hyperlipidemia    Hypertension    Memory loss    Pre-diabetes    on metformin    Stroke [facility])    03-2023   Past Surgical History:  Procedure Laterality Date   BACK SURGERY     c3,c4,c5,c6 ,l4,l5   EYE SURGERY     bil cataracts   NASAL SEPTUM SURGERY     TONSILLECTOMY     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   Procedure: LEFT TOTAL HIP ARTHROPLASTY ANTERIOR APPROACH;  Surgeon: Claiborne Crew, MD;  Location: [facility];  Service: Orthopedics;  Laterality: Left;  70 mins   Allergies  Allergen Reactions   Brilinta  [Ticagrelor ] Shortness Of Breath   Pantoprazole  Hives   Rosuvastatin Other (See Comments)    Other Reaction(s): memory fog   Iodine Hives and Other (See Comments)    Internal only    Physical Exam: Palpable pedal pulses noted.  The hallux nails show approximately 30% clearing at the proximal portion of the nail plate.  Right third toenail seems to be a little slower in improvement showing approximately 15% clearing proximally.  Epicritic sensation is intact  Assessment/Plan of Care: 1. Onychomycosis of great toe    The hallux nails and right 3rd toenail were debrided.  Continue with Rx ciclopirox  8% solution to the toenails once daily.  Recheck in 3 months.   Joe Murders, DPM, FACFAS [facility]     [address] [place], [place] [address]                Office [phone]  Fax [phone]

## 2023-07-22 ENCOUNTER — Ambulatory Visit: Payer: PPO | Admitting: Adult Health

## 2023-07-22 ENCOUNTER — Ambulatory Visit: Admitting: Neurology

## 2023-07-22 ENCOUNTER — Encounter: Payer: Self-pay | Admitting: Neurology

## 2023-07-22 VITALS — BP 133/70 | HR 70 | Ht 66.0 in | Wt 173.0 lb

## 2023-07-22 DIAGNOSIS — R202 Paresthesia of skin: Secondary | ICD-10-CM

## 2023-07-22 DIAGNOSIS — I6381 Other cerebral infarction due to occlusion or stenosis of small artery: Secondary | ICD-10-CM

## 2023-07-22 NOTE — Progress Notes (Signed)
 [facility] [address] [place]. [place] [address] [phone]       OFFICE FOLLOW-UP NOTE  [name] Date of Birth:  04-Dec-1940 Medical Record Number:  [id number]   HPI: Timothy Brewer is a pleasant 83 year old Caucasian male seen today for initial office follow-up visit following hospital consultation for stroke in January 2025.  History is obtained from the patient and review of electronic medical records.  I personally reviewed pertinent available imaging films in PACS. He has past medical history of diabetes, hypertension, hyperlipidemia, TIA, ex smoking and mild cognitive impairment.  He presented on 03/25/2023 with sudden onset of right hand and arm numbness.  This persisted for several hours so he came to the hospital.  His symptoms are too mild to treat with TNK.  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  MR angiogram of the brain showed no large vessel stenosis but MR angiogram of the neck showed 70% proximal right ICA and 50% proximal left ICA stenosis.  2D echo showed ejection fraction of 50 to 55%.  Left atrial size was normal.  LDL cholesterol was 72 mg percent.  Hemoglobin A1c was 5.6.  Patient was on Plavix  prior to admission and was switched to aspirin  and Plavix  for 3 weeks followed by aspirin .  Patient also met criteria for and signed informed consent to participate in the [facility] stroke prevention trial (standard of care antiplatelet therapy with or without Asundexian- a factor 11 inhibitor ) .  Patient states he has done well since discharge.  He is tolerating aspirin  and statin medication with minor bruising but no bleeding.  He does still have some tingling and numbness in his fingers as well as forearm and arm but it is much improved.  He feels the hand numbness may be related to carpal tunnel as he clicks the remote of the TV a lot.  He states his memory and cognitive difficulties  are unchanged.  He in fact stopped Prevagen which she had taken for a few years and had not noticed any benefit.  He has no new complaints today.  His blood pressure is under good control today it is 133/70.  He is tolerating Lipitor well without muscle aches and pains.  He states his sugars are also under good control. Update 01/06/2023 Timothy Brewer: Patient returns for follow-up visit unaccompanied. Reports overall doing well since prior visit. He does note difficulties with his balance and gait, present for the past several years, denies worsening, was previously doing program at [facility] for [facility] but was stopped due to COVID. Does not use AD, no recent falls. Reports cognition has been stable since prior visit, greater difficulty with short term memory.  Continues to take Prevagen.  Continues to drive without difficulty.  Compliant on show prevention medications.  Routinely follows with PCP for stroke risk factor management.       History provided for reference purposes only Update 07/07/2022 Dr. Janett Brewer: He returns for follow-up after last visit a year ago.  He is accompanied by his wife.  Continues to have mild short-term memory difficulties which she feels are more or less unchanged.  He often cannot remember names immediately but later on they come to him.  He is still independent and managing all activities of daily living.  He is quite active and works out in [employer] 3 days a week.  He has been taking Prevagen and participating in daily mentally  challenging activities.  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  Denies any stroke or TIA symptoms.  He is tolerating Plavix  well without bruising or bleeding.  He states his sugars under good control and last A1c checked 3 months ago by primary care physician Dr. Janifer Brewer was 6.7.  He is tolerating Lipitor 20 mg daily well without muscle aches or pains.  He was recently evaluated by rheumatologist for migratory  pain has resolved. MMSE today scored 27/36 which is slighty decline from 29/30 at last visit.   Update 12/26/2020 Dr. Janett Brewer: He returns for follow-up after last visit on 07/09/2020 with  Timothy Brewer nurse practitioner.  He is accompanied by his friend Timothy Brewer.  Patient had another episode of TIA on 09/11/2020.  He developed sudden onset of left face and hand numbness lasted about 20 minutes.  He also noticed that he was off balance.  Symptoms resolved by the time days to hospital.  MRI scan of the brain was obtained which I personally reviewed showed no acute abnormality.  Showed only changes of chronic small vessel disease and mild generalized atrophy.  2D echo showed normal ejection fraction of 55 to 60% without cardiac source of embolism.  Transcranial Doppler study on 09/18/2020 was normal.  LDL cholesterol 72 mg percent.  Hemoglobin A1c was 6.0.  Carotid ultrasound showed no significant extracranial stenosis.  Patient was changed from aspirin  to Plavix  which is tolerating well without bruising or bleeding.  He has had no recurrent TIA or stroke symptoms since then.  He states his blood pressure is under good control and today it is 126/66.  He states his sugars are also under good control.  He is tolerating Lipitor well without any side effects.  Patient can continues to complain of mild short-term memory difficulties but these are mostly unchanged.  He is worried about developing Alzheimer's since his sister died of it.  On Mini-Mental status exam today scored 29/30 with only 1 deficit in recall.  He still mostly independent in activities of daily living.   Update 07/09/2020 Timothy Brewer: [name] is being seen for hospital follow-up accompanied by his wife, Timothy Brewer   Doing well since discharge without any reoccurring or new stroke/TIA symptoms.  He has returned back to all prior activities without difficulty.   Completed 4 weeks of Brilinta  and remains on aspirin  81 mg daily without associated side effects He does report  symptoms of shortness of breath while on Brilinta  that resolved upon discontinuing Remains on atorvastatin  20 mg daily without associated side effects Blood pressure today 126/68 -routinely monitors at home and typically 120s/60s   Routinely followed by Dr. Janett Brewer for MCI with prior visit 12/2019 and scheduled follow-up visit 12/2020   No further concerns at this time     Stroke admission 06/03/2020: Timothy Brewer is a 83 y.o. male with history of HTN, HLD, DM, former tobacco use, and history of prior stroke who presented on 06/03/2020 sudden onset confusion and speech difficulty lasting 25 to 30 minutes and transient right leg weakness.  Personally reviewed hospitalization pertinent progress notes, lab work and imaging with summary provided.  Evaluated by Dr. Janett Brewer and likely posterior circulation TIA vs seizure with postictal confusion.  EEG negative.  Recommended aspirin  and Brilinta  for 30 days then aspirin  alone.  LDL 64 on atorvastatin  20 mg daily.  A1c 6.3 on metformin .  Evaluated by therapies and discharged home in stable condition without therapy needs.  ROS:   14 system review of systems is positive for numbness, tingling, bruising all other systems negative  PMH:  Past Medical History:  Diagnosis Date   Arthritis    Diabetes mellitus without complication (HCC)    Hyperlipidemia    Hypertension    Memory loss    Pre-diabetes    on metformin    Stroke [facility])    03-2023    Social History:  Social History   Socioeconomic History   Marital status: Married    Spouse name: Not on file   Number of children: 2   Years of education: some college   Highest education level: Not on file  Occupational History   Occupation: retired   Occupation: Cabin crew  Tobacco Use   Smoking status: Former    Current packs/day: 0.00    Average packs/day: 2.0 packs/day for 2.0 years (4.0 ttl pk-yrs)    Types: Cigarettes    Start date: 07/08/2012    Quit date: 07/09/2014    Years  since quitting: 9.0   Smokeless tobacco: Never   Tobacco comments:    8-10 a day  Vaping Use   Vaping status: Never Used  Substance and Sexual Activity   Alcohol use: Yes    Alcohol/week: 1.0 standard drink of alcohol    Types: 1 Glasses of wine per week    Comment: socially   Drug use: No   Sexual activity: Yes  Other Topics Concern   Not on file  Social History Narrative   Lives with friend   Right Handed   Drinks 2-3 cups caffeine daily   Social Drivers of Health   Financial Resource Strain: Not on file  Food Insecurity: No Food Insecurity (03/26/2023)   Hunger Vital Sign    Worried About Running Out of Food in the Last Year: Never true    Ran Out of Food in the Last Year: Never true  Transportation Needs: No Transportation Needs (03/26/2023)   PRAPARE - Administrator, Civil Service (Medical): No    Lack of Transportation (Non-Medical): No  Physical Activity: Not on file  Stress: Not on file  Social Connections: Socially Integrated (03/26/2023)   Social Connection and Isolation Panel [NHANES]    Frequency of Communication with Friends and Family: More than three times a week    Frequency of Social Gatherings with Friends and Family: Twice a week    Attends Religious Services: More than 4 times per year    Active Member of [employer] or Organizations: Yes    Attends Engineer, structural: More than 4 times per year    Marital Status: Married  Catering manager Violence: Not At Risk (03/26/2023)   Humiliation, Afraid, Rape, and Kick questionnaire    Fear of Current or Ex-Partner: No    Emotionally Abused: No    Physically Abused: No    Sexually Abused: No    Medications:   Current Outpatient Medications on File Prior to Visit  Medication Sig Dispense Refill   acetic acid 2 % otic solution Place 4 drops into both ears as needed (itching).     aspirin  EC 81 MG tablet Take 1 tablet (81 mg total) by mouth daily. Swallow whole. 30 tablet 12   atorvastatin   (LIPITOR) 40 MG tablet Take 1 tablet (40 mg total) by mouth daily. 180 tablet 2   Calcium  Carb-Cholecalciferol (CALCIUM  600 + D PO) Take 1 tablet by mouth every evening.     cetirizine (ZYRTEC) 10 MG  tablet Take 10 mg by mouth at bedtime.     cholecalciferol (VITAMIN D3) 25 MCG (1000 UNIT) tablet Take 1,000 Units by mouth at bedtime.     ciclopirox  (PENLAC ) 8 % solution Apply topically at bedtime. Apply over nail and surrounding skin. Apply daily over previous coat. Remove weekly with polish remover. 6.6 mL 11   Ferrous Sulfate  (IRON) 325 (65 Fe) MG TABS Take 325 mg by mouth at bedtime.     loratadine  (CLARITIN ) 10 MG tablet Take 10 mg by mouth in the morning.     magnesium  oxide (MAG-OX) 400 MG tablet Take 400 mg by mouth at bedtime.     metFORMIN  (GLUCOPHAGE -XR) 500 MG 24 hr tablet Take 1 tablet (500 mg total) by mouth 2 (two) times daily.     Multiple Vitamins-Minerals (PRESERVISION AREDS PO) Take 1 tablet by mouth 2 (two) times daily.     neomycin-polymyxin b-dexamethasone  (MAXITROL) 3.5-10000-0.1 SUSP Place 3 drops into both eyes as needed (itching).     sildenafil (REVATIO) 20 MG tablet Take 100 mg by mouth daily as needed (erectile dysfunction).     Study - OCEANIC-STROKE - asundexian 50 mg or placebo tablet (PI-Adream Parzych) Take 1 tablet (50 mg total) by mouth daily. For Investigational Use Only. Take 1 tablet by mouth once daily at the same time each day, preferably in the morning. Please bring bottle to every visit. 98 tablet 0   valsartan  (DIOVAN ) 40 MG tablet Take 40 mg by mouth daily.     vitamin B-12 (CYANOCOBALAMIN ) 500 MCG tablet Take 500 mcg by mouth every evening.     No current facility-administered medications on file prior to visit.    Allergies:   Allergies  Allergen Reactions   Brilinta  [Ticagrelor ] Shortness Of Breath   Pantoprazole  Hives   Rosuvastatin Other (See Comments)    Other Reaction(s): memory fog   Iodine Hives and Other (See Comments)    Internal only     Physical Exam General: well developed, well nourished pleasant elderly Caucasian male, seated, in no evident distress Head: head normocephalic and atraumatic.  Neck: supple with no carotid or supraclavicular bruits Cardiovascular: regular rate and rhythm, no murmurs Musculoskeletal: no deformity Skin:  no rash/petichiae Vascular:  Normal pulses all extremities Vitals:   07/22/23 1606  BP: 133/70  Pulse: 70   Neurologic Exam Mental Status: Awake and fully alert. Oriented to place and time. Recent and remote memory intact. Attention span, concentration and fund of knowledge appropriate. Mood and affect appropriate.  MMSE score 29/30.  Able to name 11 animals which can walk on 4 legs.  Clock drawing 4/4. Cranial Nerves: Fundoscopic exam reveals sharp disc margins. Pupils equal, briskly reactive to light. Extraocular movements full without nystagmus. Visual fields full to confrontation. Hearing intact. Facial sensation intact. Face, tongue, palate moves normally and symmetrically.  Motor: Normal bulk and tone. Normal strength in all tested extremity muscles. Sensory.: intact to touch ,pinprick .position and vibratory sensation.  Coordination: Rapid alternating movements normal in all extremities. Finger-to-nose and heel-to-shin performed accurately bilaterally. Gait and Station: Arises from chair without difficulty. Stance is normal. Gait demonstrates normal stride length and balance . Able to heel, toe and tandem walk with mild difficulty.  Reflexes: 1+ and symmetric. Toes downgoing.   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  Patient is doing well with very minimal paresthesias.  Vascular risk factors of diabetes, hypertension, hyperlipidemia, remote smoking and prior history of TIA.  Patient is participating in the [facility] stroke prevention study     PLAN:I had a long d/w patient about his recent  thalamic lacunar stroke, risk for recurrent stroke/TIAs, personally independently reviewed imaging studies and stroke evaluation results and answered questions.Continue aspirin  81 mg daily and [facility] Stroke trial medication ( Asundexian versus placebo )  for secondary stroke prevention and maintain strict control of hypertension with blood pressure goal below 130/90, diabetes with hemoglobin A1c goal below 6.5% and lipids with LDL cholesterol goal below 70 mg/dL. I also advised the patient to eat a healthy diet with plenty of whole grains, cereals, fruits and vegetables, exercise regularly and maintain ideal body weight . Keep scheduled appointments for [facility] stroke trial study. He may undergo hip replacement surgery but will need to hold [facility] stroke trial medicine for 3 days prior to hip surgery and resume after surgery when safe.Followup in the future with me only as needed.  Greater than 50% of time during this 35 minute visit was spent on counseling,explanation of diagnosis of lacunar stroke, planning of further management, discussion with patient and family and coordination of care Ardella Beaver, MD Note: This document was prepared with digital dictation and possible smart phrase technology. Any transcriptional errors that result from this process are unintentional

## 2023-07-22 NOTE — Patient Instructions (Signed)
 I had a long d/w patient about his recent  thalamic lacunar stroke, risk for recurrent stroke/TIAs, personally independently reviewed imaging studies and stroke evaluation results and answered questions.Continue aspirin  81 mg daily and [facility] Stroke trial medication ( Asundexian versus placebo )  for secondary stroke prevention and maintain strict control of hypertension with blood pressure goal below 130/90, diabetes with hemoglobin A1c goal below 6.5% and lipids with LDL cholesterol goal below 70 mg/dL. I also advised the patient to eat a healthy diet with plenty of whole grains, cereals, fruits and vegetables, exercise regularly and maintain ideal body weight . Keep scheduled appointments for [facility] stroke trial study. He may undergo hip replacement surgery but will need to hold [facility] stroke trial medicine for 3 days prior to hip surgery and resume after surgery when safe.Followup in the future with me only as needed.  Stroke Prevention Some medical conditions and behaviors can lead to a higher chance of having a stroke. You can help prevent a stroke by eating healthy, exercising, not smoking, and managing any medical conditions you have. Stroke is a leading cause of functional impairment. Primary prevention is particularly important because a majority of strokes are first-time events. Stroke changes the lives of not only those who experience a stroke but also their family and other caregivers. How can this condition affect me? A stroke is a medical emergency and should be treated right away. A stroke can lead to brain damage and can sometimes be life-threatening. If a person gets medical treatment right away, there is a better chance of surviving and recovering from a stroke. What can increase my risk? The following medical conditions may increase your risk of a stroke: Cardiovascular disease. High blood pressure (hypertension). Diabetes. High cholesterol. Sickle cell disease. Blood clotting  disorders (hypercoagulable state). Obesity. Sleep disorders (obstructive sleep apnea). Other risk factors include: Being older than age 34. Having a history of blood clots, stroke, or mini-stroke (transient ischemic attack, TIA). Genetic factors, such as race, ethnicity, or a family history of stroke. Smoking cigarettes or using other tobacco products. Taking birth control pills, especially if you also use tobacco. Heavy use of alcohol or drugs, especially cocaine and methamphetamine. Physical inactivity. What actions can I take to prevent this? Manage your health conditions High cholesterol levels. Eating a healthy diet is important for preventing high cholesterol. If cholesterol cannot be managed through diet alone, you may need to take medicines. Take any prescribed medicines to control your cholesterol as told by your health care provider. Hypertension. To reduce your risk of stroke, try to keep your blood pressure below 130/80. Eating a healthy diet and exercising regularly are important for controlling blood pressure. If these steps are not enough to manage your blood pressure, you may need to take medicines. Take any prescribed medicines to control hypertension as told by your health care provider. Ask your health care provider if you should monitor your blood pressure at home. Have your blood pressure checked every year, even if your blood pressure is normal. Blood pressure increases with age and some medical conditions. Diabetes. Eating a healthy diet and exercising regularly are important parts of managing your blood sugar (glucose). If your blood sugar cannot be managed through diet and exercise, you may need to take medicines. Take any prescribed medicines to control your diabetes as told by your health care provider. Get evaluated for obstructive sleep apnea. Talk to your health care provider about getting a sleep evaluation if you snore a lot or have  excessive sleepiness. Make  sure that any other medical conditions you have, such as atrial fibrillation or atherosclerosis, are managed. Nutrition Follow instructions from your health care provider about what to eat or drink to help manage your health condition. These instructions may include: Reducing your daily calorie intake. Limiting how much salt (sodium) you use to 1,500 milligrams (mg) each day. Using only healthy fats for cooking, such as olive oil, canola oil, or sunflower oil. Eating healthy foods. You can do this by: Choosing foods that are high in fiber, such as whole grains, and fresh fruits and vegetables. Eating at least 5 servings of fruits and vegetables a day. Try to fill one-half of your plate with fruits and vegetables at each meal. Choosing lean protein foods, such as lean cuts of meat, poultry without skin, fish, tofu, beans, and nuts. Eating low-fat dairy products. Avoiding foods that are high in sodium. This can help lower blood pressure. Avoiding foods that have saturated fat, trans fat, and cholesterol. This can help prevent high cholesterol. Avoiding processed and prepared foods. Counting your daily carbohydrate intake.  Lifestyle If you drink alcohol: Limit how much you have to: 0-1 drink a day for women who are not pregnant. 0-2 drinks a day for men. Know how much alcohol is in your drink. In the [place] one drink equals one 12 oz bottle of beer ( ), one 5 oz glass of wine ( ), or one 1 oz glass of hard liquor (44mL). Do not use any products that contain nicotine or tobacco. These products include cigarettes, chewing tobacco, and vaping devices, such as e-cigarettes. If you need help quitting, ask your health care provider. Avoid secondhand smoke. Do not use drugs. Activity  Try to stay at a healthy weight. Get at least 30 minutes of exercise on most days, such as: Fast walking. Biking. Swimming. Medicines Take over-the-counter and prescription medicines only as told by your  health care provider. Aspirin  or blood thinners (antiplatelets or anticoagulants) may be recommended to reduce your risk of forming blood clots that can lead to stroke. Avoid taking birth control pills. Talk to your health care provider about the risks of taking birth control pills if: You are over 84 years old. You smoke. You get very bad headaches. You have had a blood clot. Where to find more information American Stroke Association: www.strokeassociation.org Get help right away if: You or a loved one has any symptoms of a stroke. "BE FAST" is an easy way to remember the main warning signs of a stroke: B - Balance. Signs are dizziness, sudden trouble walking, or loss of balance. E - Eyes. Signs are trouble seeing or a sudden change in vision. F - Face. Signs are sudden weakness or numbness of the face, or the face or eyelid drooping on one side. A - Arms. Signs are weakness or numbness in an arm. This happens suddenly and usually on one side of the body. S - Speech. Signs are sudden trouble speaking, slurred speech, or trouble understanding what people say. T - Time. Time to call emergency services. Write down what time symptoms started. You or a loved one has other signs of a stroke, such as: A sudden, severe headache with no known cause. Nausea or vomiting. Seizure. These symptoms may represent a serious problem that is an emergency. Do not wait to see if the symptoms will go away. Get medical help right away. Call your local emergency services (911 in the [place] Do not drive yourself to the hospital.  Summary You can help to prevent a stroke by eating healthy, exercising, not smoking, limiting alcohol intake, and managing any medical conditions you may have. Do not use any products that contain nicotine or tobacco. These include cigarettes, chewing tobacco, and vaping devices, such as e-cigarettes. If you need help quitting, ask your health care provider. Remember "BE FAST" for warning  signs of a stroke. Get help right away if you or a loved one has any of these signs. This information is not intended to replace advice given to you by your health care provider. Make sure you discuss any questions you have with your health care provider. Document Revised: 01/27/2022 Document Reviewed: 01/27/2022 Elsevier Patient Education  2024 [employer].

## 2023-07-24 ENCOUNTER — Encounter: Payer: Self-pay | Admitting: Podiatry

## 2023-07-24 DIAGNOSIS — E782 Mixed hyperlipidemia: Secondary | ICD-10-CM | POA: Insufficient documentation

## 2023-07-24 DIAGNOSIS — I6930 Unspecified sequelae of cerebral infarction: Secondary | ICD-10-CM | POA: Insufficient documentation

## 2023-07-24 DIAGNOSIS — I10 Essential (primary) hypertension: Secondary | ICD-10-CM | POA: Insufficient documentation

## 2023-07-24 DIAGNOSIS — E119 Type 2 diabetes mellitus without complications: Secondary | ICD-10-CM | POA: Insufficient documentation

## 2023-07-24 DIAGNOSIS — L309 Dermatitis, unspecified: Secondary | ICD-10-CM | POA: Insufficient documentation

## 2023-07-24 DIAGNOSIS — I700 Atherosclerosis of aorta: Secondary | ICD-10-CM | POA: Insufficient documentation

## 2023-07-24 DIAGNOSIS — N529 Male erectile dysfunction, unspecified: Secondary | ICD-10-CM | POA: Insufficient documentation

## 2023-07-24 DIAGNOSIS — J439 Emphysema, unspecified: Secondary | ICD-10-CM | POA: Insufficient documentation

## 2023-07-24 DIAGNOSIS — G3184 Mild cognitive impairment, so stated: Secondary | ICD-10-CM | POA: Insufficient documentation

## 2023-07-24 DIAGNOSIS — J309 Allergic rhinitis, unspecified: Secondary | ICD-10-CM | POA: Insufficient documentation

## 2023-07-24 DIAGNOSIS — E559 Vitamin D deficiency, unspecified: Secondary | ICD-10-CM | POA: Insufficient documentation

## 2023-07-24 DIAGNOSIS — M8580 Other specified disorders of bone density and structure, unspecified site: Secondary | ICD-10-CM | POA: Insufficient documentation

## 2023-08-04 DIAGNOSIS — H353132 Nonexudative age-related macular degeneration, bilateral, intermediate dry stage: Secondary | ICD-10-CM | POA: Diagnosis not present

## 2023-08-04 DIAGNOSIS — H524 Presbyopia: Secondary | ICD-10-CM | POA: Diagnosis not present

## 2023-08-04 DIAGNOSIS — H35371 Puckering of macula, right eye: Secondary | ICD-10-CM | POA: Diagnosis not present

## 2023-08-04 DIAGNOSIS — H5213 Myopia, bilateral: Secondary | ICD-10-CM | POA: Diagnosis not present

## 2023-08-04 DIAGNOSIS — E119 Type 2 diabetes mellitus without complications: Secondary | ICD-10-CM | POA: Diagnosis not present

## 2023-08-10 ENCOUNTER — Ambulatory Visit (HOSPITAL_COMMUNITY)
Admission: RE | Admit: 2023-08-10 | Discharge: 2023-08-10 | Disposition: A | Discharge status: Home / Self Care | Source: Ambulatory Visit | Attending: Surgery | Admitting: Surgery

## 2023-08-10 DIAGNOSIS — I6381 Other cerebral infarction due to occlusion or stenosis of small artery: Secondary | ICD-10-CM | POA: Diagnosis not present

## 2023-08-12 DIAGNOSIS — M1611 Unilateral primary osteoarthritis, right hip: Secondary | ICD-10-CM | POA: Diagnosis not present

## 2023-08-12 DIAGNOSIS — M7061 Trochanteric bursitis, right hip: Secondary | ICD-10-CM | POA: Diagnosis not present

## 2023-08-12 DIAGNOSIS — Z96642 Presence of left artificial hip joint: Secondary | ICD-10-CM | POA: Diagnosis not present

## 2023-08-18 ENCOUNTER — Ambulatory Visit: Payer: Self-pay | Admitting: Neurology

## 2023-08-19 NOTE — Telephone Encounter (Signed)
 LVM and sent mychart msg asking pt to call back to schedule follow up with either [name] or Dr. [name]

## 2023-08-26 DIAGNOSIS — J441 Chronic obstructive pulmonary disease with (acute) exacerbation: Secondary | ICD-10-CM | POA: Diagnosis not present

## 2023-08-26 DIAGNOSIS — J439 Emphysema, unspecified: Secondary | ICD-10-CM | POA: Diagnosis not present

## 2023-08-26 DIAGNOSIS — E1169 Type 2 diabetes mellitus with other specified complication: Secondary | ICD-10-CM | POA: Diagnosis not present

## 2023-08-26 DIAGNOSIS — I10 Essential (primary) hypertension: Secondary | ICD-10-CM | POA: Diagnosis not present

## 2023-09-07 DIAGNOSIS — I10 Essential (primary) hypertension: Secondary | ICD-10-CM | POA: Diagnosis not present

## 2023-09-07 DIAGNOSIS — J439 Emphysema, unspecified: Secondary | ICD-10-CM | POA: Diagnosis not present

## 2023-09-07 DIAGNOSIS — J441 Chronic obstructive pulmonary disease with (acute) exacerbation: Secondary | ICD-10-CM | POA: Diagnosis not present

## 2023-09-07 DIAGNOSIS — E1169 Type 2 diabetes mellitus with other specified complication: Secondary | ICD-10-CM | POA: Diagnosis not present

## 2023-09-07 DIAGNOSIS — E782 Mixed hyperlipidemia: Secondary | ICD-10-CM | POA: Diagnosis not present

## 2023-09-21 ENCOUNTER — Other Ambulatory Visit (HOSPITAL_COMMUNITY): Payer: Self-pay | Admitting: Pharmacist

## 2023-09-21 MED ORDER — STUDY - OCEANIC-STROKE - ASUNDEXIAN 50 MG OR PLACEBO TABLET (PI-SETHI): 1.0000 | ORAL_TABLET | Freq: Every day | ORAL | 0 refills | Status: DC

## 2023-09-23 DIAGNOSIS — Z96642 Presence of left artificial hip joint: Secondary | ICD-10-CM | POA: Diagnosis not present

## 2023-09-23 DIAGNOSIS — M7061 Trochanteric bursitis, right hip: Secondary | ICD-10-CM | POA: Diagnosis not present

## 2023-09-23 DIAGNOSIS — M1611 Unilateral primary osteoarthritis, right hip: Secondary | ICD-10-CM | POA: Diagnosis not present

## 2023-09-24 ENCOUNTER — Telehealth: Payer: Self-pay

## 2023-09-24 DIAGNOSIS — I10 Essential (primary) hypertension: Secondary | ICD-10-CM | POA: Diagnosis not present

## 2023-09-24 DIAGNOSIS — E1169 Type 2 diabetes mellitus with other specified complication: Secondary | ICD-10-CM | POA: Diagnosis not present

## 2023-09-24 DIAGNOSIS — J441 Chronic obstructive pulmonary disease with (acute) exacerbation: Secondary | ICD-10-CM | POA: Diagnosis not present

## 2023-09-24 DIAGNOSIS — J439 Emphysema, unspecified: Secondary | ICD-10-CM | POA: Diagnosis not present

## 2023-09-24 NOTE — Telephone Encounter (Signed)
   Pre-operative Risk Assessment    Patient Name: Timothy Brewer  DOB: 09/26/1940 MRN: [id number]   Date of last office visit: 04/16/23 DARRYLE DECENT, MD Date of next office visit: NONE   Request for Surgical Clearance    Procedure:  RIGHT TOTAL HIP ARTHROPLASTY  Date of Surgery:  Clearance 12/08/23                                Surgeon:  DR DONNICE CAR Surgeon's Group or Practice Name:  [facility] Phone number:  [phone] Fax number:  [phone]  ATTN: JOEN WILLS   Type of Clearance Requested:   - Medical  - Pharmacy:  Hold Aspirin      Type of Anesthesia:  Spinal   Additional requests/questions:    SignedLucie DELENA Ku   09/24/2023, 10:55 AM

## 2023-09-24 NOTE — Telephone Encounter (Signed)
 S/W pt and scheduled TELE Preop appt 09/30/23. Med Rec and consent done.   Pt stated on a trail med and knows to stop 1 wk before procedure.

## 2023-09-24 NOTE — Telephone Encounter (Signed)
 Med Rec and Consent done.    Patient Consent for Virtual Visit        Timothy Brewer has provided verbal consent on 09/24/2023 for a virtual visit (video or telephone).   CONSENT FOR VIRTUAL VISIT FOR:  Timothy Brewer Search  By participating in this virtual visit I agree to the following:  I hereby voluntarily request, consent and authorize [facility] and its employed or contracted physicians, physician assistants, nurse practitioners or other licensed health care professionals (the Practitioner), to provide me with telemedicine health care services (the "Services) as deemed necessary by the treating Practitioner. I acknowledge and consent to receive the Services by the Practitioner via telemedicine. I understand that the telemedicine visit will involve communicating with the Practitioner through live audiovisual communication technology and the disclosure of certain medical information by electronic transmission. I acknowledge that I have been given the opportunity to request an in-person assessment or other available alternative prior to the telemedicine visit and am voluntarily participating in the telemedicine visit.  I understand that I have the right to withhold or withdraw my consent to the use of telemedicine in the course of my care at any time, without affecting my right to future care or treatment, and that the Practitioner or I may terminate the telemedicine visit at any time. I understand that I have the right to inspect all information obtained and/or recorded in the course of the telemedicine visit and may receive copies of available information for a reasonable fee.  I understand that some of the potential risks of receiving the Services via telemedicine include:  Delay or interruption in medical evaluation due to technological equipment failure or disruption; Information transmitted may not be sufficient (e.g. poor resolution of images) to allow for appropriate medical  decision making by the Practitioner; and/or  In rare instances, security protocols could fail, causing a breach of personal health information.  Furthermore, I acknowledge that it is my responsibility to provide information about my medical history, conditions and care that is complete and accurate to the best of my ability. I acknowledge that Practitioner's advice, recommendations, and/or decision may be based on factors not within their control, such as incomplete or inaccurate data provided by me or distortions of diagnostic images or specimens that may result from electronic transmissions. I understand that the practice of medicine is not an exact science and that Practitioner makes no warranties or guarantees regarding treatment outcomes. I acknowledge that a copy of this consent can be made available to me via my patient portal [facility] MyChart), or I can request a printed copy by calling the office of  [facility].    I understand that my insurance will be billed for this visit.   I have read or had this consent read to me. I understand the contents of this consent, which adequately explains the benefits and risks of the Services being provided via telemedicine.  I have been provided ample opportunity to ask questions regarding this consent and the Services and have had my questions answered to my satisfaction. I give my informed consent for the services to be provided through the use of telemedicine in my medical care

## 2023-09-24 NOTE — Telephone Encounter (Signed)
   Name: Timothy Brewer  DOB: 04-09-1940  MRN: [id number]  Primary Cardiologist: None   Preoperative team, please contact this patient and set up a phone call appointment for further preoperative risk assessment. Please obtain consent and complete medication review. Thank you for your help.  I confirm that guidance regarding antiplatelet and oral anticoagulation therapy has been completed and, if necessary, noted below.  Aspirin  is managed by neurology  I also confirmed the patient resides in the state of [place] . As per [facility] Medical Board telemedicine laws, the patient must reside in the state in which the provider is licensed.   Lum LITTIE Louis, NP 09/24/2023, 12:45 PM [facility]

## 2023-09-28 ENCOUNTER — Telehealth: Payer: Self-pay

## 2023-09-28 NOTE — Telephone Encounter (Signed)
 Preoperative clearance form received from [facility].  Patient will be having a right total hip arthroplasty.  Anticipated anesthetic type will be spinal with anticipated surgery date of December 08, 2023.  They are requesting instructions on aspirin .  It is recommended for patient to hold aspirin  3 days prior to procedure with a low but acceptable risk of reoccurring stroke.  Clearance placed on MD desk for review and signature.

## 2023-09-30 ENCOUNTER — Ambulatory Visit: Attending: Internal Medicine | Admitting: Nurse Practitioner

## 2023-09-30 ENCOUNTER — Encounter: Payer: Self-pay | Admitting: Nurse Practitioner

## 2023-09-30 DIAGNOSIS — Z01810 Encounter for preprocedural cardiovascular examination: Secondary | ICD-10-CM

## 2023-09-30 NOTE — Telephone Encounter (Signed)
Clearance faxed back, confirmation received.

## 2023-09-30 NOTE — Progress Notes (Signed)
 Virtual Visit via Telephone Note   Because of KARANDEEP RESENDE co-morbid illnesses, he is at least at moderate risk for complications without adequate follow up.  This format is felt to be most appropriate for this patient at this time.  Due to technical limitations with video connection (technology), today's appointment will be conducted as an audio only telehealth visit, and Timothy Brewer verbally agreed to proceed in this manner.   All issues noted in this document were discussed and addressed.  No physical exam could be performed with this format.  Evaluation Performed:  Preoperative cardiovascular risk assessment _____________   Date:  09/30/2023   Patient ID:  Timothy Brewer, DOB 04/04/1940, MRN [id number] Patient Location:  Home Provider location:   Office  Primary Care Provider:  Seabron Lenis, MD Primary Cardiologist:  Darryle ONEIDA Decent, MD  Chief Complaint / Patient Profile   83 y.o. 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 Ernie on 12/08/23 and presents today for telephonic preoperative cardiovascular risk assessment.  History of Present Illness    Timothy Brewer is a 83 y.o. male who presents via audio/video conferencing for a telehealth visit today.  Pt was last seen in cardiology clinic on 04/16/23 by Dr. Decent.  At that time Timothy Brewer was doing well.  The patient is now pending procedure as outlined above. Since his last visit, he  denies chest pain, shortness of breath, lower extremity edema, fatigue, palpitations, melena, hematuria, hemoptysis, diaphoresis, weakness, presyncope, syncope, orthopnea, and PND. He has a home blue tooth blood pressure cuff with BP well controlled. He is active around home and is able to achieve > 4 METS activity without concerning cardiac symptoms.   Past Medical History    Past Medical History:   Diagnosis Date   Arthritis    Diabetes mellitus without complication (HCC)    Hyperlipidemia    Hypertension    Memory loss    Pre-diabetes    on metformin    Stroke [facility])    03-2023   Past Surgical History:  Procedure Laterality Date   BACK SURGERY     c3,c4,c5,c6 ,l4,l5   EYE SURGERY     bil cataracts   NASAL SEPTUM SURGERY     TONSILLECTOMY     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   Procedure: LEFT TOTAL HIP ARTHROPLASTY ANTERIOR APPROACH;  Surgeon: Ernie Cough, MD;  Location: [facility];  Service: Orthopedics;  Laterality: Left;  70 mins    Allergies  Allergies  Allergen Reactions   Brilinta  [Ticagrelor ] Shortness Of Breath   Pantoprazole  Hives   Rosuvastatin Other (See Comments)    Other Reaction(s): memory fog   Iodine Hives and Other (See Comments)    Internal only    Home Medications    Prior to Admission medications   Medication Sig Start Date End Date Taking? Authorizing Provider  acetic acid 2 % otic solution Place 4 drops into both ears as needed (itching). 09/04/22   [provider]  aspirin  EC 81 MG tablet Take 1 tablet (81 mg total) by mouth daily. Swallow whole. 03/27/23   Ghimire, Donalda HERO, MD  atorvastatin  (LIPITOR) 40 MG tablet Take 1 tablet (40 mg total) by mouth daily. 04/16/23   O'NealDarryle Debby, MD  Calcium  Carb-Cholecalciferol (CALCIUM  600 + D PO) Take 1 tablet by mouth every evening.    [provider]  cetirizine (ZYRTEC) 10 MG tablet Take 10 mg by mouth at bedtime. 08/04/17   [provider]  cholecalciferol (VITAMIN D3) 25 MCG (1000 UNIT) tablet Take 1,000 Units by mouth at bedtime. 08/04/17   [provider]  ciclopirox  (PENLAC ) 8 % solution Apply topically at bedtime. Apply over nail and surrounding skin. Apply daily over previous coat. Remove weekly with polish remover. 01/21/23   McCaughan, Dia D, DPM  Ferrous Sulfate  (IRON) 325 (65 Fe) MG TABS Take 325 mg by mouth at bedtime.    [provider]   loratadine  (CLARITIN ) 10 MG tablet Take 10 mg by mouth in the morning.    [provider]  magnesium  oxide (MAG-OX) 400 MG tablet Take 400 mg by mouth at bedtime.    [provider]  metFORMIN  (GLUCOPHAGE -XR) 500 MG 24 hr tablet Take 1 tablet (500 mg total) by mouth 2 (two) times daily. 06/05/20   Vann, Jessica U, DO  Multiple Vitamins-Minerals (PRESERVISION AREDS PO) Take 1 tablet by mouth 2 (two) times daily.    [provider]  neomycin-polymyxin b-dexamethasone  (MAXITROL) 3.5-10000-0.1 SUSP Place 3 drops into both eyes as needed (itching). 11/19/22   [provider]  sildenafil (REVATIO) 20 MG tablet Take 100 mg by mouth daily as needed (erectile dysfunction). 01/19/19   [provider]  Study GLENWOOD PICKLER - asundexian 50 mg or placebo tablet (PI-Sethi) Take 1 tablet (50 mg total) by mouth daily. For Investigational Use Only. Take 1 tablet by mouth once daily at the same time each day, preferably in the morning. Please bring bottle to every visit. 09/21/23   Sethi, Pramod S, MD  valsartan  (DIOVAN ) 40 MG tablet Take 40 mg by mouth daily.    [provider]  vitamin B-12 (CYANOCOBALAMIN ) 500 MCG tablet Take 500 mcg by mouth every evening.    [provider]    Physical Exam    Vital Signs:  Timothy Brewer does not have vital signs available for review today.  Given telephonic nature of communication, physical exam is limited. AAOx3. NAD. Normal affect.  Speech and respirations are unlabored.  Accessory Clinical Findings    None  Assessment & Plan    1.  Preoperative Cardiovascular Risk Assessment: According to the Revised Cardiac Risk Index (RCRI), his Perioperative Risk of Major Cardiac Event is (%): 6.6 due to history of stroke and high risk surgery but no cardiac concerns.  His Functional Capacity in METs is: 6.05 according to the Duke Activity Status Index (DASI). The patient is doing well from a cardiac perspective.  Therefore, based on ACC/AHA guidelines, the patient would be at acceptable risk for the planned procedure without further cardiovascular testing.   The patient was advised that if he develops new symptoms prior to surgery to contact our office to arrange for a follow-up visit, and he verbalized understanding.  Aspirin  is managed by neurology  A copy of this note will be routed to requesting surgeon.  Time:   Today, I have spent 10 minutes with the patient with telehealth technology discussing medical history, symptoms, and management plan.     Timothy EMERSON Bane, NP-C  09/30/2023, 9:44 AM [address], [address] [place], [place] [address] Office [phone] Fax [phone]

## 2023-10-06 DIAGNOSIS — E559 Vitamin D deficiency, unspecified: Secondary | ICD-10-CM | POA: Diagnosis not present

## 2023-10-06 DIAGNOSIS — D509 Iron deficiency anemia, unspecified: Secondary | ICD-10-CM | POA: Diagnosis not present

## 2023-10-06 DIAGNOSIS — I6522 Occlusion and stenosis of left carotid artery: Secondary | ICD-10-CM | POA: Diagnosis not present

## 2023-10-06 DIAGNOSIS — E539 Vitamin B deficiency, unspecified: Secondary | ICD-10-CM | POA: Diagnosis not present

## 2023-10-06 DIAGNOSIS — E785 Hyperlipidemia, unspecified: Secondary | ICD-10-CM | POA: Diagnosis not present

## 2023-10-06 DIAGNOSIS — G8929 Other chronic pain: Secondary | ICD-10-CM | POA: Diagnosis not present

## 2023-10-06 DIAGNOSIS — J309 Allergic rhinitis, unspecified: Secondary | ICD-10-CM | POA: Diagnosis not present

## 2023-10-06 DIAGNOSIS — I10 Essential (primary) hypertension: Secondary | ICD-10-CM | POA: Diagnosis not present

## 2023-10-06 DIAGNOSIS — E1169 Type 2 diabetes mellitus with other specified complication: Secondary | ICD-10-CM | POA: Diagnosis not present

## 2023-10-06 DIAGNOSIS — M1990 Unspecified osteoarthritis, unspecified site: Secondary | ICD-10-CM | POA: Diagnosis not present

## 2023-10-06 DIAGNOSIS — E663 Overweight: Secondary | ICD-10-CM | POA: Diagnosis not present

## 2023-10-06 DIAGNOSIS — H3530 Unspecified macular degeneration: Secondary | ICD-10-CM | POA: Diagnosis not present

## 2023-10-08 DIAGNOSIS — J439 Emphysema, unspecified: Secondary | ICD-10-CM | POA: Diagnosis not present

## 2023-10-08 DIAGNOSIS — E782 Mixed hyperlipidemia: Secondary | ICD-10-CM | POA: Diagnosis not present

## 2023-10-08 DIAGNOSIS — J441 Chronic obstructive pulmonary disease with (acute) exacerbation: Secondary | ICD-10-CM | POA: Diagnosis not present

## 2023-10-08 DIAGNOSIS — E1169 Type 2 diabetes mellitus with other specified complication: Secondary | ICD-10-CM | POA: Diagnosis not present

## 2023-10-08 DIAGNOSIS — I10 Essential (primary) hypertension: Secondary | ICD-10-CM | POA: Diagnosis not present

## 2023-10-22 DIAGNOSIS — M1611 Unilateral primary osteoarthritis, right hip: Secondary | ICD-10-CM | POA: Diagnosis not present

## 2023-10-24 DIAGNOSIS — I10 Essential (primary) hypertension: Secondary | ICD-10-CM | POA: Diagnosis not present

## 2023-10-24 DIAGNOSIS — J439 Emphysema, unspecified: Secondary | ICD-10-CM | POA: Diagnosis not present

## 2023-10-24 DIAGNOSIS — J441 Chronic obstructive pulmonary disease with (acute) exacerbation: Secondary | ICD-10-CM | POA: Diagnosis not present

## 2023-10-24 DIAGNOSIS — E1169 Type 2 diabetes mellitus with other specified complication: Secondary | ICD-10-CM | POA: Diagnosis not present

## 2023-10-26 ENCOUNTER — Encounter: Payer: Self-pay | Admitting: Podiatry

## 2023-10-26 ENCOUNTER — Ambulatory Visit: Admitting: Podiatry

## 2023-10-26 DIAGNOSIS — B351 Tinea unguium: Secondary | ICD-10-CM | POA: Diagnosis not present

## 2023-10-26 NOTE — Progress Notes (Unsigned)
      Subjective:  Patient ID: Timothy Brewer, male    DOB: September 20, 1940,  MRN: [id number]  Timothy Brewer presents to clinic today for fungal nail check.  He is using the Rx ciclopirox  lacquer on bilateral hallux and right 3rd toenails.  He gets pedicures every 2 months to trim his nails.  His wife applies the lacquer for him.  He feels there has been some improvement.    PCP is Seabron Lenis, MD.  Past Medical History:  Diagnosis Date   Arthritis    Diabetes mellitus without complication (HCC)    Hyperlipidemia    Hypertension    Memory loss    Pre-diabetes    on metformin    Stroke [facility])    03-2023   Past Surgical History:  Procedure Laterality Date   BACK SURGERY     c3,c4,c5,c6 ,l4,l5   EYE SURGERY     bil cataracts   NASAL SEPTUM SURGERY     TONSILLECTOMY     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   Procedure: LEFT TOTAL HIP ARTHROPLASTY ANTERIOR APPROACH;  Surgeon: Ernie Cough, MD;  Location: [facility];  Service: Orthopedics;  Laterality: Left;  70 mins   Allergies  Allergen Reactions   Brilinta  [Ticagrelor ] Shortness Of Breath   Pantoprazole  Hives   Rosuvastatin Other (See Comments)    Other Reaction(s): memory fog   Iodine Hives and Other (See Comments)    Internal only    Review of Systems: Negative except as noted in the HPI.  Objective:  Vascular Examination: Capillary refill time is 3-5 seconds to toes bilateral. Palpable pedal pulses b/l LE. Digital hair present b/l.    Dermatological Examination: Pedal skin with normal turgor, texture and tone b/l. No open wounds. No interdigital macerations b/l.  The 3 toenails are 3mm thick, discolored, dystrophic with subungual debris. There is pain with compression of the nail plates.  There is almost 50% clearing proximally to the toenails.     Latest Ref Rng & Units 03/26/2023    4:45 AM  Hemoglobin A1C  Hemoglobin-A1c 4.8 - 5.6 % 5.6    Assessment/Plan: 1. Dermatophytosis of nail     The mycotic toenails  were sharply debrided x3 with sterile nail nippers and a power debriding burr to decrease bulk/thickness and length.    Patient to continue with current treatment plan for the fungal nails as there is improvement noted.  Continue with ciclopirox  8% solution to the toenails every day.   F/u 3-4 months    Melynda Krzywicki DSABRA Imperial, DPM, FACFAS [facility]     [address] [place], [place] [address]                Office [phone]  Fax [phone]

## 2023-11-08 DIAGNOSIS — E782 Mixed hyperlipidemia: Secondary | ICD-10-CM | POA: Diagnosis not present

## 2023-11-08 DIAGNOSIS — J441 Chronic obstructive pulmonary disease with (acute) exacerbation: Secondary | ICD-10-CM | POA: Diagnosis not present

## 2023-11-08 DIAGNOSIS — I10 Essential (primary) hypertension: Secondary | ICD-10-CM | POA: Diagnosis not present

## 2023-11-08 DIAGNOSIS — E1169 Type 2 diabetes mellitus with other specified complication: Secondary | ICD-10-CM | POA: Diagnosis not present

## 2023-11-08 DIAGNOSIS — J439 Emphysema, unspecified: Secondary | ICD-10-CM | POA: Diagnosis not present

## 2023-11-23 DIAGNOSIS — E1169 Type 2 diabetes mellitus with other specified complication: Secondary | ICD-10-CM | POA: Diagnosis not present

## 2023-11-23 DIAGNOSIS — J439 Emphysema, unspecified: Secondary | ICD-10-CM | POA: Diagnosis not present

## 2023-11-23 DIAGNOSIS — J441 Chronic obstructive pulmonary disease with (acute) exacerbation: Secondary | ICD-10-CM | POA: Diagnosis not present

## 2023-11-23 DIAGNOSIS — I10 Essential (primary) hypertension: Secondary | ICD-10-CM | POA: Diagnosis not present

## 2023-12-08 ENCOUNTER — Ambulatory Visit (HOSPITAL_COMMUNITY): Admit: 2023-12-08 | Admitting: Orthopedic Surgery

## 2023-12-08 DIAGNOSIS — M1611 Unilateral primary osteoarthritis, right hip: Secondary | ICD-10-CM

## 2023-12-08 DIAGNOSIS — I10 Essential (primary) hypertension: Secondary | ICD-10-CM | POA: Diagnosis not present

## 2023-12-08 DIAGNOSIS — E1169 Type 2 diabetes mellitus with other specified complication: Secondary | ICD-10-CM | POA: Diagnosis not present

## 2023-12-08 DIAGNOSIS — J441 Chronic obstructive pulmonary disease with (acute) exacerbation: Secondary | ICD-10-CM | POA: Diagnosis not present

## 2023-12-08 DIAGNOSIS — J439 Emphysema, unspecified: Secondary | ICD-10-CM | POA: Diagnosis not present

## 2023-12-08 DIAGNOSIS — E782 Mixed hyperlipidemia: Secondary | ICD-10-CM | POA: Diagnosis not present

## 2023-12-08 SURGERY — ARTHROPLASTY, HIP, TOTAL, ANTERIOR APPROACH
Anesthesia: Spinal | Site: Hip | Laterality: Right

## 2023-12-17 DIAGNOSIS — Z6826 Body mass index (BMI) 26.0-26.9, adult: Secondary | ICD-10-CM | POA: Diagnosis not present

## 2023-12-17 DIAGNOSIS — R5383 Other fatigue: Secondary | ICD-10-CM | POA: Diagnosis not present

## 2023-12-17 DIAGNOSIS — M5030 Other cervical disc degeneration, unspecified cervical region: Secondary | ICD-10-CM | POA: Diagnosis not present

## 2023-12-17 DIAGNOSIS — R0789 Other chest pain: Secondary | ICD-10-CM | POA: Diagnosis not present

## 2023-12-17 DIAGNOSIS — M064 Inflammatory polyarthropathy: Secondary | ICD-10-CM | POA: Diagnosis not present

## 2023-12-17 DIAGNOSIS — M25512 Pain in left shoulder: Secondary | ICD-10-CM | POA: Diagnosis not present

## 2023-12-17 DIAGNOSIS — R7989 Other specified abnormal findings of blood chemistry: Secondary | ICD-10-CM | POA: Diagnosis not present

## 2023-12-17 DIAGNOSIS — E663 Overweight: Secondary | ICD-10-CM | POA: Diagnosis not present

## 2023-12-17 DIAGNOSIS — M353 Polymyalgia rheumatica: Secondary | ICD-10-CM | POA: Diagnosis not present

## 2023-12-23 DIAGNOSIS — I10 Essential (primary) hypertension: Secondary | ICD-10-CM | POA: Diagnosis not present

## 2023-12-23 DIAGNOSIS — J441 Chronic obstructive pulmonary disease with (acute) exacerbation: Secondary | ICD-10-CM | POA: Diagnosis not present

## 2023-12-23 DIAGNOSIS — J439 Emphysema, unspecified: Secondary | ICD-10-CM | POA: Diagnosis not present

## 2023-12-23 DIAGNOSIS — E1169 Type 2 diabetes mellitus with other specified complication: Secondary | ICD-10-CM | POA: Diagnosis not present

## 2024-01-05 DIAGNOSIS — M353 Polymyalgia rheumatica: Secondary | ICD-10-CM | POA: Diagnosis not present

## 2024-01-05 DIAGNOSIS — R7989 Other specified abnormal findings of blood chemistry: Secondary | ICD-10-CM | POA: Diagnosis not present

## 2024-01-05 DIAGNOSIS — M5030 Other cervical disc degeneration, unspecified cervical region: Secondary | ICD-10-CM | POA: Diagnosis not present

## 2024-01-05 DIAGNOSIS — M25512 Pain in left shoulder: Secondary | ICD-10-CM | POA: Diagnosis not present

## 2024-01-05 DIAGNOSIS — R0789 Other chest pain: Secondary | ICD-10-CM | POA: Diagnosis not present

## 2024-01-05 DIAGNOSIS — Z6827 Body mass index (BMI) 27.0-27.9, adult: Secondary | ICD-10-CM | POA: Diagnosis not present

## 2024-01-05 DIAGNOSIS — Z7952 Long term (current) use of systemic steroids: Secondary | ICD-10-CM | POA: Diagnosis not present

## 2024-01-05 DIAGNOSIS — E663 Overweight: Secondary | ICD-10-CM | POA: Diagnosis not present

## 2024-01-05 DIAGNOSIS — R5383 Other fatigue: Secondary | ICD-10-CM | POA: Diagnosis not present

## 2024-01-08 DIAGNOSIS — E1169 Type 2 diabetes mellitus with other specified complication: Secondary | ICD-10-CM | POA: Diagnosis not present

## 2024-01-08 DIAGNOSIS — J441 Chronic obstructive pulmonary disease with (acute) exacerbation: Secondary | ICD-10-CM | POA: Diagnosis not present

## 2024-01-08 DIAGNOSIS — I10 Essential (primary) hypertension: Secondary | ICD-10-CM | POA: Diagnosis not present

## 2024-01-08 DIAGNOSIS — J439 Emphysema, unspecified: Secondary | ICD-10-CM | POA: Diagnosis not present

## 2024-01-08 DIAGNOSIS — E782 Mixed hyperlipidemia: Secondary | ICD-10-CM | POA: Diagnosis not present

## 2024-01-14 DIAGNOSIS — J439 Emphysema, unspecified: Secondary | ICD-10-CM | POA: Diagnosis not present

## 2024-01-14 DIAGNOSIS — E1169 Type 2 diabetes mellitus with other specified complication: Secondary | ICD-10-CM | POA: Diagnosis not present

## 2024-01-14 DIAGNOSIS — Z0000 Encounter for general adult medical examination without abnormal findings: Secondary | ICD-10-CM | POA: Diagnosis not present

## 2024-01-14 DIAGNOSIS — I10 Essential (primary) hypertension: Secondary | ICD-10-CM | POA: Diagnosis not present

## 2024-01-14 DIAGNOSIS — I6930 Unspecified sequelae of cerebral infarction: Secondary | ICD-10-CM | POA: Diagnosis not present

## 2024-01-14 DIAGNOSIS — Z8673 Personal history of transient ischemic attack (TIA), and cerebral infarction without residual deficits: Secondary | ICD-10-CM | POA: Diagnosis not present

## 2024-01-14 DIAGNOSIS — M85832 Other specified disorders of bone density and structure, left forearm: Secondary | ICD-10-CM | POA: Diagnosis not present

## 2024-01-14 DIAGNOSIS — E782 Mixed hyperlipidemia: Secondary | ICD-10-CM | POA: Diagnosis not present

## 2024-01-14 DIAGNOSIS — R3915 Urgency of urination: Secondary | ICD-10-CM | POA: Diagnosis not present

## 2024-01-14 DIAGNOSIS — G3184 Mild cognitive impairment, so stated: Secondary | ICD-10-CM | POA: Diagnosis not present

## 2024-01-14 DIAGNOSIS — Z1331 Encounter for screening for depression: Secondary | ICD-10-CM | POA: Diagnosis not present

## 2024-01-14 DIAGNOSIS — M353 Polymyalgia rheumatica: Secondary | ICD-10-CM | POA: Diagnosis not present

## 2024-01-22 ENCOUNTER — Other Ambulatory Visit: Payer: Self-pay | Admitting: Podiatry

## 2024-01-22 DIAGNOSIS — E1169 Type 2 diabetes mellitus with other specified complication: Secondary | ICD-10-CM | POA: Diagnosis not present

## 2024-01-22 DIAGNOSIS — J441 Chronic obstructive pulmonary disease with (acute) exacerbation: Secondary | ICD-10-CM | POA: Diagnosis not present

## 2024-01-22 DIAGNOSIS — J439 Emphysema, unspecified: Secondary | ICD-10-CM | POA: Diagnosis not present

## 2024-01-22 DIAGNOSIS — I10 Essential (primary) hypertension: Secondary | ICD-10-CM | POA: Diagnosis not present

## 2024-01-25 ENCOUNTER — Encounter: Payer: Self-pay | Admitting: Podiatry

## 2024-01-25 ENCOUNTER — Ambulatory Visit: Admitting: Podiatry

## 2024-01-25 DIAGNOSIS — B351 Tinea unguium: Secondary | ICD-10-CM

## 2024-01-25 NOTE — Progress Notes (Signed)
    Subjective:  Patient ID: Timothy Brewer, male    DOB: 02-02-1941,  MRN: [id number]  Timothy Brewer presents to clinic today for fungal nail check.  He is using the prescription ciclopirox  solution on the bilateral great toenails and the right third toenail.  He feels that there is continued improvement.  Not having any side effects at this time.  PCP is Seabron Lenis, MD.  Past Medical History:  Diagnosis Date   Arthritis    Diabetes mellitus without complication (HCC)    Hyperlipidemia    Hypertension    Memory loss    Pre-diabetes    on metformin    Stroke [facility])    03-2023   Past Surgical History:  Procedure Laterality Date   BACK SURGERY     c3,c4,c5,c6 ,l4,l5   EYE SURGERY     bil cataracts   NASAL SEPTUM SURGERY     TONSILLECTOMY     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   Procedure: LEFT TOTAL HIP ARTHROPLASTY ANTERIOR APPROACH;  Surgeon: Ernie Cough, MD;  Location: [facility];  Service: Orthopedics;  Laterality: Left;  70 mins   Allergies  Allergen Reactions   Brilinta  [Ticagrelor ] Shortness Of Breath   Pantoprazole  Hives   Rosuvastatin Other (See Comments)    Other Reaction(s): memory fog   Iodine Hives and Other (See Comments)    Internal only    Review of Systems: Negative except as noted in the HPI.  Objective:  Vascular Examination: Capillary refill time is 3-5 seconds to toes bilateral. Palpable pedal pulses b/l LE. Digital hair present b/l.    Dermatological Examination: Pedal skin with normal turgor, texture and tone b/l. No open wounds. No interdigital macerations b/l.  The bilateral hallux and right third toenails are 3mm thick, discolored, dystrophic with subungual debris. There is pain with compression of the nail plates.       Latest Ref Rng & Units 03/26/2023    4:45 AM  Hemoglobin A1C  Hemoglobin-A1c 4.8 - 5.6 % 5.6      Assessment/Plan: 1. Dermatophytosis of nail    The mycotic toenails were sharply debrided x 3 with sterile nail  nippers and a power debriding burr to decrease bulk/thickness and length.    Patient to continue with current treatment plan for the fungal nails as there is improvement noted.  He will continue with a prescription ciclopirox  8% lacquer once daily to the affected toenails.  He is applying it to the bilateral great toenails as well as the right third toenail.     Awanda CHARM Imperial, DPM, FACFAS [facility]     [address] [place], [place] [address]                Office [phone]  Fax [phone]

## 2024-02-07 DIAGNOSIS — J439 Emphysema, unspecified: Secondary | ICD-10-CM | POA: Diagnosis not present

## 2024-02-07 DIAGNOSIS — E782 Mixed hyperlipidemia: Secondary | ICD-10-CM | POA: Diagnosis not present

## 2024-02-07 DIAGNOSIS — I10 Essential (primary) hypertension: Secondary | ICD-10-CM | POA: Diagnosis not present

## 2024-02-07 DIAGNOSIS — E1169 Type 2 diabetes mellitus with other specified complication: Secondary | ICD-10-CM | POA: Diagnosis not present

## 2024-02-07 DIAGNOSIS — J441 Chronic obstructive pulmonary disease with (acute) exacerbation: Secondary | ICD-10-CM | POA: Diagnosis not present

## 2024-02-17 ENCOUNTER — Encounter: Payer: Self-pay | Admitting: Neurology

## 2024-02-17 NOTE — Telephone Encounter (Signed)
 I called the patient.  He states over the last 3 to 4 months he has noticed more constant and prominent numbness in hand and forearm.  The location of the forearm numbness is the same as it was when he saw Dr. Rosemarie in May,however it is more noticeable especially with the patient wearing longsleeve shirts.  He states he is more aware of his arm now whereas he was not before.  He also says now the entire U-shape connection between his thumb and his index finger is numb. He denies any other symptoms such as weakness, speech difficulty, vision changes, etc. His speech was clear and comprehensible. He is currently scheduled to see Harlene NP for 53-month follow-up on 03/01/24.  I have informed the patient that if he has any acute worsening, changes, new stroke-like symptoms to call 911.  Otherwise I will call him back and let him know what Dr Rosemarie says.

## 2024-02-17 NOTE — Telephone Encounter (Signed)
 Possibly post stroke symptoms but difficult to say. I would be more than happy to see him sooner but I do not have any sooner availability. If symptoms should worsen or especially start to experience weakness or any other stroke symptoms, he needs to call 911 immediately for more emergent evaluation.

## 2024-03-01 ENCOUNTER — Ambulatory Visit: Admitting: Adult Health

## 2024-03-01 ENCOUNTER — Encounter: Payer: Self-pay | Admitting: Adult Health

## 2024-03-01 VITALS — BP 127/49 | HR 83 | Ht 67.0 in | Wt 176.0 lb

## 2024-03-01 DIAGNOSIS — I6522 Occlusion and stenosis of left carotid artery: Secondary | ICD-10-CM

## 2024-03-01 DIAGNOSIS — I6381 Other cerebral infarction due to occlusion or stenosis of small artery: Secondary | ICD-10-CM | POA: Diagnosis not present

## 2024-03-01 DIAGNOSIS — Z8673 Personal history of transient ischemic attack (TIA), and cerebral infarction without residual deficits: Secondary | ICD-10-CM | POA: Diagnosis not present

## 2024-03-01 DIAGNOSIS — G3184 Mild cognitive impairment, so stated: Secondary | ICD-10-CM

## 2024-03-01 NOTE — Progress Notes (Signed)
 " [facility] [address] [place] [address] [phone]       STROKE FOLLOW UP NOTE  Timothy Brewer Date of Birth:  1941-01-31 Medical Record Number:  [id number]    Primary neurologist: Dr. Rosemarie Reason for visit: stroke follow up    SUBJECTIVE:   CHIEF COMPLAINT:  Chief Complaint  Patient presents with   Follow-up    Patient in room 8 with wife Patient is here for stroke follow up. Patient is experiencing numbness on right side, right hand between index and thumb. And top of right arm.  Patient states no new symptoms or concerns.     HPI:   Update 03/01/2024 JM: Patient returns for follow-up visit.  Patient sent MyChart message on 12/10 reporting worsening RUE distal numbness over the past 3 to 4 months. He reports similar numbness post stroke but feels more intense, feels itchy sensation, not painful, no weakness, primarily in between thumb and index finger as well as forearm. He is unsure if forearm numbness more noticeable as he has been wearing long sleeves more.  Denies any right leg numbness.  He does have sensation of standing on a rolled up sock on pad of feet bilaterally which is not new. Denies any new weakness, changes in speech or vision.  Continues to have mild short-term memory difficulties but denies any progression since prior visit.  He was diagnosed with polymyalgia rheumatica and was started on prednisone with improvement of symptoms, gradually working on tapering with assistance of rheumatology.  He is also modifying his diet and avoiding high inflammation foods.  He does report compliance on aspirin  and atorvastatin .  Completed oceanic trial.  Blood pressure well-controlled.  Routinely follows with PCP.      History provided for reference purposes only Update 07/22/2023 Dr. Rosemarie: Timothy Brewer is a pleasant 83 year old Caucasian male seen today for initial office follow-up visit following hospital consultation for stroke in  January 2025.  History is obtained from the patient and review of electronic medical records.  I personally reviewed pertinent available imaging films in PACS. He has past medical history of diabetes, hypertension, hyperlipidemia, TIA, ex smoking and mild cognitive impairment.  He presented on 03/25/2023 with sudden onset of right hand and arm numbness.  This persisted for several hours so he came to the hospital.  His symptoms are too mild to treat with TNK.  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  MR angiogram of the brain showed no large vessel stenosis but MR angiogram of the neck showed 70% proximal right ICA and 50% proximal left ICA stenosis.  2D echo showed ejection fraction of 50 to 55%.  Left atrial size was normal.  LDL cholesterol was 72 mg percent.  Hemoglobin A1c was 5.6.  Patient was on Plavix  prior to admission and was switched to aspirin  and Plavix  for 3 weeks followed by aspirin .  Patient also met criteria for and signed informed consent to participate in the [facility] stroke prevention trial (standard of care antiplatelet therapy with or without Asundexian- a factor 11 inhibitor ) .  Patient states he has done well since discharge.  He is tolerating aspirin  and statin medication with minor bruising but no bleeding.  He does still have some tingling and numbness in his fingers as well as forearm and arm but it is much improved.  He feels the hand numbness may be related to carpal tunnel as he  clicks the remote of the TV a lot.  He states his memory and cognitive difficulties are unchanged.  He in fact stopped Prevagen which she had taken for a few years and had not noticed any benefit.  He has no new complaints today.  His blood pressure is under good control today it is 133/70.  He is tolerating Lipitor well without muscle aches and pains.  He states his sugars are also under good control.  Update 01/06/2023 JM: Patient  returns for follow-up visit unaccompanied. Reports overall doing well since prior visit. He does note difficulties with his balance and gait, present for the past several years, denies worsening, was previously doing program at [facility] for balance but was stopped due to COVID. Does not use AD, no recent falls. Reports cognition has been stable since prior visit, greater difficulty with short term memory.  Continues to take Prevagen.  Continues to drive without difficulty.  Compliant on stroke prevention medications.  Routinely follows with PCP for stroke risk factor management.  Update 07/07/2022 Dr. Rosemarie: He returns for follow-up after last visit a year ago.  He is accompanied by his wife.  Continues to have mild short-term memory difficulties which she feels are more or less unchanged.  He often cannot remember names immediately but later on they come to him.  He is still independent and managing all activities of daily living.  He is quite active and works out in [employer] 3 days a week.  He has been taking Prevagen and participating in daily mentally challenging activities.  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  Denies any stroke or TIA symptoms.  He is tolerating Plavix  well without bruising or bleeding.  He states his sugars under good control and last A1c checked 3 months ago by primary care physician Dr. Seabron was 6.7.  He is tolerating Lipitor 20 mg daily well without muscle aches or pains.  He was recently evaluated by rheumatologist for migratory pain has resolved. MMSE today scored 27/36 which is slighty decline from 29/30 at last visit.  Update 12/26/2020 Dr. Rosemarie: He returns for follow-up after last visit on 07/09/2020 with  Harlene nurse practitioner.  He is accompanied by his friend Earnie.  Patient had another episode of TIA on 09/11/2020.  He developed sudden onset of left face and hand numbness lasted about 20 minutes.  He also noticed  that he was off balance.  Symptoms resolved by the time days to hospital.  MRI scan of the brain was obtained which I personally reviewed showed no acute abnormality.  Showed only changes of chronic small vessel disease and mild generalized atrophy.  2D echo showed normal ejection fraction of 55 to 60% without cardiac source of embolism.  Transcranial Doppler study on 09/18/2020 was normal.  LDL cholesterol 72 mg percent.  Hemoglobin A1c was 6.0.  Carotid ultrasound showed no significant extracranial stenosis.  Patient was changed from aspirin  to Plavix  which is tolerating well without bruising or bleeding.  He has had no recurrent TIA or stroke symptoms since then.  He states his blood pressure is under good control and today it is 126/66.  He states his sugars are also under good control.  He is tolerating Lipitor well without any side effects.  Patient can continues to complain of mild short-term memory difficulties but these are mostly unchanged.  He is worried about developing Alzheimer's since his sister died of it.  On  Mini-Mental status exam today scored 29/30 with only 1 deficit in recall.  He still mostly independent in activities of daily living.  Update 07/09/2020 JM: Timothy Brewer is being seen for hospital follow-up accompanied by his wife, Hadassah  Doing well since discharge without any reoccurring or new stroke/TIA symptoms.  He has returned back to all prior activities without difficulty.  Completed 4 weeks of Brilinta  and remains on aspirin  81 mg daily without associated side effects He does report symptoms of shortness of breath while on Brilinta  that resolved upon discontinuing Remains on atorvastatin  20 mg daily without associated side effects Blood pressure today 126/68 -routinely monitors at home and typically 120s/60s  Routinely followed by Dr. Rosemarie for MCI with prior visit 12/2019 and scheduled follow-up visit 12/2020  No further concerns at this time   Stroke admission  06/03/2020: Timothy Brewer is a 83 y.o. male with history of HTN, HLD, DM, former tobacco use, and history of prior stroke who presented on 06/03/2020 sudden onset confusion and speech difficulty lasting 25 to 30 minutes and transient right leg weakness.  Personally reviewed hospitalization pertinent progress notes, lab work and imaging with summary provided.  Evaluated by Dr. Rosemarie and likely posterior circulation TIA vs seizure with postictal confusion.  EEG negative.  Recommended aspirin  and Brilinta  for 30 days then aspirin  alone.  LDL 64 on atorvastatin  20 mg daily.  A1c 6.3 on metformin .  Evaluated by therapies and discharged home in stable condition without therapy needs.  Stroke like episode, possible TIA  with negative stroke work up.    Code Stroke CT head No acute intracranial abnormalities. Chronic small vessel ischemic change and brain atrophy. MRI   No evidence of acute infarction, hemorrhage, or mass. Mild chronic microvascular ischemic changes MRA   No evidence of acute infarction, hemorrhage, or mass. Mild chronic microvascular ischemic changes. Normal MRA head. Carotid Doppler: Right Carotid: Velocities in the right ICA are consistent with a 1-39% stenosis.  Left Carotid: Velocities in the left ICA are consistent with a 1-39% stenosis.  Vertebrals: Bilateral vertebral arteries demonstrate antegrade flow.  2D Echo: done, with read pending EEG read is normal. No seizure activity.  LDL 64 HgbA1c 6.3 Antithrombotic: Not on antithrombotic PTA.  Recommended aspirin  81 mg daily and Brilinta  for 4 weeks then aspirin  alone Therapy recommendations:  none Disposition:  Home        ROS:   14 system review of systems performed and negative with exception of those listed in HPI  PMH:  Past Medical History:  Diagnosis Date   Arthritis    Diabetes mellitus without complication (HCC)    Hyperlipidemia    Hypertension    Memory loss    Pre-diabetes    on metformin     Stroke (HCC)    03-2023    PSH:  Past Surgical History:  Procedure Laterality Date   BACK SURGERY     c3,c4,c5,c6 ,l4,l5   EYE SURGERY     bil cataracts   NASAL SEPTUM SURGERY     TONSILLECTOMY     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   Procedure: LEFT TOTAL HIP ARTHROPLASTY ANTERIOR APPROACH;  Surgeon: Ernie Cough, MD;  Location: [facility];  Service: Orthopedics;  Laterality: Left;  70 mins    Social History:  Social History   Socioeconomic History   Marital status: Married    Spouse name: Not on file   Number of children: 2   Years of education: some college   Highest  education level: Not on file  Occupational History   Occupation: retired   Occupation: Cabin Crew  Tobacco Use   Smoking status: Former    Current packs/day: 0.00    Average packs/day: 2.0 packs/day for 2.0 years (4.0 ttl pk-yrs)    Types: Cigarettes    Start date: 07/08/2012    Quit date: 07/09/2014    Years since quitting: 9.6   Smokeless tobacco: Never   Tobacco comments:    8-10 a day  Vaping Use   Vaping status: Never Used  Substance and Sexual Activity   Alcohol use: Yes    Alcohol/week: 1.0 standard drink of alcohol    Types: 1 Glasses of wine per week    Comment: socially   Drug use: No   Sexual activity: Yes  Other Topics Concern   Not on file  Social History Narrative   Lives with wife    Patient is retired    Engineer, Drilling   Drinks 2-3 cups caffeine daily   Social Drivers of Health   Tobacco Use: Medium Risk (03/01/2024)   Patient History    Smoking Tobacco Use: Former    Smokeless Tobacco Use: Never    Passive Exposure: Not on Actuary Strain: Not on file  Food Insecurity: No Food Insecurity (03/26/2023)   Hunger Vital Sign    Worried About Running Out of Food in the Last Year: Never true    Ran Out of Food in the Last Year: Never true  Transportation Needs: No Transportation Needs (03/26/2023)   PRAPARE - Administrator, Civil Service  (Medical): No    Lack of Transportation (Non-Medical): No  Physical Activity: Not on file  Stress: Not on file  Social Connections: Socially Integrated (03/26/2023)   Social Connection and Isolation Panel    Frequency of Communication with Friends and Family: More than three times a week    Frequency of Social Gatherings with Friends and Family: Twice a week    Attends Religious Services: More than 4 times per year    Active Member of [employer] or Organizations: Yes    Attends Engineer, Structural: More than 4 times per year    Marital Status: Married  Catering Manager Violence: Not At Risk (03/26/2023)   Humiliation, Afraid, Rape, and Kick questionnaire    Fear of Current or Ex-Partner: No    Emotionally Abused: No    Physically Abused: No    Sexually Abused: No  Depression (PHQ2-9): Not on file  Alcohol Screen: Not on file  Housing: Low Risk (03/26/2023)   Housing Stability Vital Sign    Unable to Pay for Housing in the Last Year: No    Number of Times Moved in the Last Year: 0    Homeless in the Last Year: No  Utilities: Not At Risk (03/26/2023)   AHC Utilities    Threatened with loss of utilities: No  Health Literacy: Not on file    Family History:  Family History  Problem Relation Age of Onset   Stroke Maternal Grandmother     Medications:   Current Outpatient Medications on File Prior to Visit  Medication Sig Dispense Refill   acetic acid 2 % otic solution Place 4 drops into both ears as needed (itching).     aspirin  EC 81 MG tablet Take 1 tablet (81 mg total) by mouth daily. Swallow whole. 30 tablet 12   atorvastatin  (LIPITOR) 40 MG tablet Take 1 tablet (40 mg total)  by mouth daily. 180 tablet 2   Calcium  Carb-Cholecalciferol (CALCIUM  600 + D PO) Take 1 tablet by mouth every evening.     cetirizine (ZYRTEC) 10 MG tablet Take 10 mg by mouth at bedtime.     cholecalciferol (VITAMIN D3) 25 MCG (1000 UNIT) tablet Take 1,000 Units by mouth at bedtime.     ciclopirox   (PENLAC ) 8 % solution APPLY TOPICALLY AT BEDTIME. APPLY OVER NAIL AND SURROUNDING SKIN. APPLY DAILY OVER PREVIOUS COAT. REMOVE WEEKLY WITH POLISH REMOVER. 6.6 mL 11   Ferrous Sulfate  (IRON) 325 (65 Fe) MG TABS Take 325 mg by mouth at bedtime.     loratadine  (CLARITIN ) 10 MG tablet Take 10 mg by mouth in the morning.     magnesium  oxide (MAG-OX) 400 MG tablet Take 400 mg by mouth at bedtime.     metFORMIN  (GLUCOPHAGE -XR) 500 MG 24 hr tablet Take 1 tablet (500 mg total) by mouth 2 (two) times daily. (Patient taking differently: Take 500 mg by mouth 1 day or 1 dose.)     Multiple Vitamins-Minerals (PRESERVISION AREDS PO) Take 1 tablet by mouth 2 (two) times daily.     Omega-3 Fatty Acids (FISH OIL OMEGA-3 PO) Take by mouth 2 (two) times daily.     predniSONE (STERAPRED UNI-PAK 21 TAB) 5 MG (21) TBPK tablet Take 8 mg by mouth 1 day or 1 dose. In October 10 mg November 9mg   December 8mg  January 7mg   February 6mg  March 5mg  April 4mg  May 3mg  June 2 mg July 1mg      sildenafil (REVATIO) 20 MG tablet Take 100 mg by mouth daily as needed (erectile dysfunction).     valsartan  (DIOVAN ) 40 MG tablet Take 40 mg by mouth daily.     vitamin B-12 (CYANOCOBALAMIN ) 500 MCG tablet Take 500 mcg by mouth every evening.     neomycin-polymyxin b-dexamethasone  (MAXITROL) 3.5-10000-0.1 SUSP Place 3 drops into both eyes as needed (itching).     Study - OCEANIC-STROKE - asundexian 50 mg or placebo tablet (PI-Sethi) Take 1 tablet (50 mg total) by mouth daily. For Investigational Use Only. Take 1 tablet by mouth once daily at the same time each day, preferably in the morning. Please bring bottle to every visit. (Patient not taking: Reported on 03/01/2024) 196 tablet 0   No current facility-administered medications on file prior to visit.    Allergies:   Allergies  Allergen Reactions   Brilinta  [Ticagrelor ] Shortness Of Breath   Pantoprazole  Hives   Rosuvastatin Other (See Comments)    Other Reaction(s): memory fog    Iodine Hives and Other (See Comments)    Internal only      OBJECTIVE:  Physical Exam  Vitals:   03/01/24 1543  BP: (!) 127/49  Pulse: 83  Weight: 176 lb (79.8 kg)  Height: 5' 7 (1.702 m)   Body mass index is 27.57 kg/m. No results found.  General: well developed, well nourished, very pleasant elderly Caucasian male, seated, in no evident distress  Neurologic Exam Mental Status: Awake and fully alert.   Fluent speech and language.  Oriented to place and time. Recent memory mildly impaired and remote memory intact. Attention span, concentration and fund of knowledge appropriate. Mood and affect appropriate.   Cranial Nerves: Pupils equal, briskly reactive to light. Extraocular movements full without nystagmus. Visual fields full to confrontation. Hearing intact. Facial sensation intact. Face, tongue, palate moves normally and symmetrically.  Motor: Normal bulk and tone. Normal strength in all tested extremity muscles  Sensory.:  Decreased light touch sensation to radial side of right hand, intact on lateral side, decrease sensation to light touch RUE.  Coordination: Rapid alternating movements normal in all extremities. Finger-to-nose and heel-to-shin performed accurately bilaterally. Gait and Station: Arises from chair without difficulty. Stance is hunched with curvature of spine. Gait demonstrates normal stride length and mild imbalance without use of AD  Reflexes: 1+ and symmetric. Toes downgoing.      07/22/2023    4:14 PM 07/07/2022    1:09 PM 06/27/2021    1:53 PM  MMSE - Mini Mental State Exam  Orientation to time 5 5 5   Orientation to Place 5 5 5   Registration 3 3 3   Attention/ Calculation 4 2 4   Recall 3 3 3   Language- name 2 objects 2 2 2   Language- repeat 1 1 1   Language- follow 3 step command 3 3 3   Language- read & follow direction 1 1 1   Write a sentence 1 1 1   Copy design 1 1 1   Total score 29 27 29            ASSESSMENT: Timothy Brewer is a 83  y.o. year old male with left thalamic infarct in 03/2023 likely secondary to small vessel disease as well as history of TIA in 05/2020. Vascular risk factors of diabetes, hypertension, hyperlipidemia, and remote smoking.  Residual RUE numbness poststroke with worsening over the past 3 to 4 months.     PLAN:  1.  History of thalamic stroke and TIA  -residual RUE numbness, noted increased paresthesias over the past 3-4 months, suspect more due to post stroke symptoms and possible delayed onset of central pain syndrome vs gradual healing of nerves. Although decreased sensation not previously documented, patient reported impaired sensation of RUE since his stroke. No evidence of weakness.  Low suspicion for new stroke or entrapment neuropathy. Advised to continue to monitor. Discussed conservative measures to do at home such as massage and use of compression glove/sleeve if needed. Advised to call if symptoms should further worsen and can consider further evaluation with repeat MRI brain and/or EMG/NCV. Advised to proceed to ED for emergent evaluation if he develops weakness or starts to experience symptoms in his lower extremity  -Continue aspirin  81mg  daily and atorvastatin  20 mg daily for secondary stroke prevention measures managed/prescribed by PCP. Completed oceanic trial  -Continue to follow with PCP for aggressive stroke risk factor manage including BP goal<130/90 and HLD with LDL goal<70  2.  Mild cognitive impairment  -MMSE prior 29/30, will repeat at f/u visit - due to time constraints, unable to complete today  -Consider routine physical and cognitive exercises as well as healthy diet and good sleep hygiene.  Discussed importance of management of vascular risk factors  3.  Left carotid stenosis  -CUS 08/2023, 07/2022 and 06/2021 left ICA 40 to 59% stenosis, R ICA 1 to 39% stenosis  -Plan on repeat around 08/2024 for surveillance monitoring     Follow-up in 6 months or call earlier if  needed     I personally spent a total of 45 minutes in the care of the patient today including preparing to see the patient, getting/reviewing separately obtained history, performing a medically appropriate exam/evaluation, counseling and educating, and documenting clinical information in the EHR.  Harlene Bogaert, AGNP-BC  [facility] [address] [place], [place] [address]  Phone [phone] Fax [phone] Note: This document was prepared with digital dictation and possible smart phrase technology. Any transcriptional errors  that result from this process are unintentional.   "

## 2024-03-01 NOTE — Patient Instructions (Addendum)
 Your Plan:  Suspect your numbness is residual from your stroke. You can experience some worsening of numbness sensation as nerves begin to heal so this can be a good sign! Please let me know if this should gradually worsen or starts to become painful   Continue aspirin  81mg  daily and atorvastatin  for secondary stroke prevention measures  Continue to follow with PCP for stroke risk factor management      Follow up 6 months or call earlier if needed     Thank you for coming to see us  at [facility]. I hope we have been able to provide you high quality care today.  You may receive a patient satisfaction survey over the next few weeks. We would appreciate your feedback and comments so that we may continue to improve ourselves and the health of our patients.

## 2024-04-15 ENCOUNTER — Ambulatory Visit (HOSPITAL_COMMUNITY): Admission: RE | Admit: 2024-04-15 | Payer: PPO

## 2024-04-15 DIAGNOSIS — I6522 Occlusion and stenosis of left carotid artery: Secondary | ICD-10-CM

## 2024-04-25 ENCOUNTER — Ambulatory Visit: Admitting: Podiatry

## 2024-04-29 ENCOUNTER — Ambulatory Visit: Admitting: Cardiovascular Disease

## 2024-05-02 ENCOUNTER — Ambulatory Visit: Admitting: Podiatry

## 2024-09-26 ENCOUNTER — Ambulatory Visit: Admitting: Adult Health
